# Patient Record
Sex: Female | Born: 1950 | Race: White | Hispanic: No | Smoking: Current some day smoker
Health system: Southern US, Community
[De-identification: ages and names within clinical notes are randomized; demographics above are authoritative.]

## PROBLEM LIST (undated history)

## (undated) DIAGNOSIS — E039 Hypothyroidism, unspecified: Secondary | ICD-10-CM

## (undated) DIAGNOSIS — I10 Essential (primary) hypertension: Secondary | ICD-10-CM

## (undated) DIAGNOSIS — J449 Chronic obstructive pulmonary disease, unspecified: Secondary | ICD-10-CM

## (undated) DIAGNOSIS — M1990 Unspecified osteoarthritis, unspecified site: Secondary | ICD-10-CM

## (undated) DIAGNOSIS — E119 Type 2 diabetes mellitus without complications: Secondary | ICD-10-CM

## (undated) DIAGNOSIS — I509 Heart failure, unspecified: Secondary | ICD-10-CM

## (undated) DIAGNOSIS — R7303 Prediabetes: Secondary | ICD-10-CM

## (undated) HISTORY — PX: ABDOMINAL HYSTERECTOMY: SHX81

## (undated) HISTORY — PX: APPENDECTOMY: SHX54

## (undated) HISTORY — PX: BACK SURGERY: SHX140

## (undated) HISTORY — PX: CHOLECYSTECTOMY: SHX55

---

## 1975-03-08 HISTORY — PX: TUBAL LIGATION: SHX77

## 1983-03-08 HISTORY — PX: ABDOMINAL HYSTERECTOMY: SHX81

## 1993-03-07 HISTORY — PX: CHOLECYSTECTOMY: SHX55

## 2004-03-07 HISTORY — PX: BACK SURGERY: SHX140

## 2006-02-17 ENCOUNTER — Ambulatory Visit (HOSPITAL_COMMUNITY): Admission: RE | Admit: 2006-02-17 | Discharge: 2006-02-17 | Payer: Self-pay | Admitting: Family Medicine

## 2006-03-22 ENCOUNTER — Inpatient Hospital Stay (HOSPITAL_COMMUNITY): Admission: RE | Admit: 2006-03-22 | Discharge: 2006-03-26 | Payer: Self-pay | Admitting: Neurosurgery

## 2007-08-09 ENCOUNTER — Ambulatory Visit (HOSPITAL_COMMUNITY): Admission: RE | Admit: 2007-08-09 | Discharge: 2007-08-09 | Payer: Self-pay | Admitting: Neurosurgery

## 2010-07-23 NOTE — Op Note (Signed)
Tara Tara [name], Tara [name]                ACCOUNT NO.:  [id number]   MEDICAL RECORD NO.:  [id number]          PATIENT TYPE:  INP   LOCATION:  3013                         FACILITY:  [facility]   PHYSICIAN:  Ophelia Charter, M.D.DATE OF BIRTH:  October 15, 1950   DATE OF PROCEDURE:  03/22/2006  DATE OF DISCHARGE:                               OPERATIVE REPORT   BRIEF HISTORY:  The patient is a 60 year old white female who suffers  from back and bilateral hip and leg pain.  She has failed medical  management and was worked up with a lumbar MRI which demonstrated the  patient had severe multifactorial spinal stenosis at L4-L5.  I discussed  the risks, treatment options with the patient including surgery.  The  patient has weighed the risks, benefits and alternatives of surgery and  decided to proceed with an L4-L5 decompression and fusion.   PREOPERATIVE DIAGNOSES:  1. L4-L5 spinal stenosis.  2. Degenerative disk disease.  3. Lumbar radiculopathy.  4. Lumbago.   POSTOPERATIVE DIAGNOSES:  1. L4-L5 spinal stenosis.  2. Degenerative disk disease.  3. Lumbar radiculopathy.  4. Lumbago.   PROCEDURE:  Extensive L4 laminectomy and bilateral foraminotomies; L4-L5  transforaminal lumbar interbody fusion; insertion of L4-L5 interbody  prosthesis and (capstone peak interbody prosthesis); L4-L5 posterior  nonsegmental instrumentation with legacy titanium pedicle screws and  rods; L4-L5 posterolateral arthrodesis with local morselized autograft  bone and VITOSS bone graft extender.   SURGEON:  Ophelia Charter, M.D.   ASSISTANT:  Ashok Pall, M.D.   ANESTHESIA:  General endotracheal anesthesia.   ESTIMATED BLOOD LOSS:  150 mL.   SPECIMENS:  None.   COMPLICATIONS:  None.   DESCRIPTION OF PROCEDURE:  The patient was brought to the operating room  by the anesthesia team.  General endotracheal anesthesia was induced.  The patient was then turned in a prone position on the Wilson frame.  Her  lumbosacral region was then prepared with Betadine scrub and  Betadine solution.  Sterile drapes were applied, and I injected the area  to be incised with Marcaine with epinephrine solution.  Used a scalpel  to make a linear midline incision over the L4-L5 interspace.  I used  electrocautery to perform a bilateral subperiosteal dissection exposing  the spinous process of the lamina of L3, L4, L5, and upper sacrum.  I  obtained intraoperative radiograph to confirm our location.  We then  inserted the Versa-Trac retractor for exposure.   We began by using high-speed drill to perform bilateral L4 laminotomies.  There was severe spinal stenosis secondary to overgrowth of the facets  and ligamentum flavum and bulging disk.  This required a greater  decompression that would be required for a standard PLIF, i.e., we  performed a wide bilateral laminotomy medial facetectomies and bilateral  foraminotomies about the L4-L5 nerve roots to decompress the neural  structures.   Having completed the decompression, extensive decompression on the  alternative with attention to the arthrodesis, we removed the right L4-  L5 facet.  This gave [place] wide exposure to the intravertebral disk.  I  incised the intervertebral  disk and performed a partial intervertebral  diskectomy using pituitary forceps. In preparation for the posterior  lumbar interbody fusion, we cleared the soft tissue from the vertebral  end plates at 075-GRM using the curets.  I then used trial spacers and  determined to use a 14 x 26 mm capsular PEEK interbody prosthesis.  We  prefilled this prosthesis with local autograft bone and VITOSS bone  graft extender.  We then inserted this prosthesis into the L4-L5  interspace, of course, after retracting the neural structures out of  harm's way with the nerve  retractors.  We then turned the prosthesis  sideways using the tamp.  There was a good snug fit for the prosthesis  in the interspace.  We  then filled the posterior prosthesis with local  autograft bone and VITOSS, completing the transforaminal and lumbar  interbody fusion.   We now turned our attention to the instrumentation.  Under fluoroscopic  guidance, we cannulated the bilateral L4 and L5 pedicles with the bone  probe.  We tapped the pedicles with a 4.5 mm tap.  We then probed this  out of the tapped pedicles without cortical breaches.  We then inserted  5.5 x 45 mm pedicle screws bilaterally at L4-L5 under fluoroscopic  guidance.  We then palpated along the medial aspect of bilateral L4-L5  pedicles and noted there was no cortical breaches and the L4 and L5  nerve roots were not injured.  We then connected unilateral pedicles  screws  lordotic rod which we fastened in place with the capsules.  We  tightened appropriately completing the instrumentation.   We now turned our attention to the posterolateral arthrodesis.  We  decorticated the remainder of the right L4-L5 facet by using high-speed  drill and the rongeurs.  We then laid a combination of local autograft  bone and VITOSS bone graft in the center of the decorticated  posterolateral structures completing the posterolateral arthrodesis.   We then inspected the thecal sac and the bilateral L4 and L5 nerve roots  and noted that they were well decompressed.  We obtain hemostasis with  bipolar cautery.  We removed the Versa-Trac retractor and then  reapproximated the patient's lumbar fascia with #1 Vicryl suture, the  subcutaneous tissue with interrupted 2-0 Vicryl suture, and the skin  with Steri-Strips and benzoin.  The wound was coated with Bacticin  ointment, a sterile dressing was applied.  The drapes were removed, the  patient was subsequently returned to the supine position where she was  extubated by anesthesia team and transported to the post anesthesia care  unit in stable condition.  All sponge, instrument and needle counts were correct at the end of  the case.      Ophelia Charter, M.D.  Electronically Signed     JDJ/MEDQ  D:  03/22/2006  T:  03/23/2006  Job:  [id number]

## 2010-07-23 NOTE — Discharge Summary (Signed)
Tara [name], SCHWANZ                ACCOUNT NO.:  [id number]   MEDICAL RECORD NO.:  [id number]          PATIENT TYPE:  INP   LOCATION:  3013                         FACILITY:  [facility]   PHYSICIAN:  Leeroy Cha, M.D.   DATE OF BIRTH:  [birth year]   DATE OF ADMISSION:  03/22/2006  DATE OF DISCHARGE:  03/26/2006                               DISCHARGE SUMMARY   ADMISSION DIAGNOSIS:  L4-5 degenerative disk disease with spinal  stenosis.   FINAL DIAGNOSIS:  L4-5 degenerative disk disease with spinal stenosis.   CLINICAL HISTORY:  The patient was admitted because of back pain  __________  of both legs.  The patient failed conservative treatment.  X-  rays showed degenerative disk disease at L4-5.  Laboratories normal.   [place]:  The patient was admitted by Dr. Newman Pies  and L4-5 fusion was done.  Today she is doing much better.  She is  ambulating.  The wound looks fine.  She feels that she wants to go home.   MEDICATIONS:  Percocet and Diazepam.   DIET:  Regular.   ACTIVITIES:  Not to drive.  Not to __________  elevated.   FOLLOW UP:  She is going to call to set up an appointment to see Dr.  Arnoldo Morale in the next 2-3 weeks.           ______________________________  Leeroy Cha, M.D.     EB/MEDQ  D:  03/26/2006  T:  03/26/2006  Job:  [id number]

## 2010-12-02 LAB — CREATININE, SERUM
GFR calc Af Amer: >60
GFR calc non Af Amer: 50 — ABNORMAL LOW

## 2011-04-19 DIAGNOSIS — J441 Chronic obstructive pulmonary disease with (acute) exacerbation: Secondary | ICD-10-CM | POA: Diagnosis not present

## 2011-04-19 DIAGNOSIS — R0602 Shortness of breath: Secondary | ICD-10-CM | POA: Diagnosis not present

## 2011-04-19 DIAGNOSIS — R1110 Vomiting, unspecified: Secondary | ICD-10-CM | POA: Diagnosis not present

## 2011-04-19 DIAGNOSIS — R0902 Hypoxemia: Secondary | ICD-10-CM | POA: Diagnosis not present

## 2011-04-19 DIAGNOSIS — R0989 Other specified symptoms and signs involving the circulatory and respiratory systems: Secondary | ICD-10-CM | POA: Diagnosis not present

## 2011-04-20 DIAGNOSIS — Z801 Family history of malignant neoplasm of trachea, bronchus and lung: Secondary | ICD-10-CM | POA: Diagnosis not present

## 2011-04-20 DIAGNOSIS — E871 Hypo-osmolality and hyponatremia: Secondary | ICD-10-CM | POA: Diagnosis not present

## 2011-04-20 DIAGNOSIS — R0989 Other specified symptoms and signs involving the circulatory and respiratory systems: Secondary | ICD-10-CM | POA: Diagnosis not present

## 2011-04-20 DIAGNOSIS — Z7982 Long term (current) use of aspirin: Secondary | ICD-10-CM | POA: Diagnosis not present

## 2011-04-20 DIAGNOSIS — J9600 Acute respiratory failure, unspecified whether with hypoxia or hypercapnia: Secondary | ICD-10-CM | POA: Diagnosis not present

## 2011-04-20 DIAGNOSIS — R0602 Shortness of breath: Secondary | ICD-10-CM | POA: Diagnosis not present

## 2011-04-20 DIAGNOSIS — E785 Hyperlipidemia, unspecified: Secondary | ICD-10-CM | POA: Diagnosis not present

## 2011-04-20 DIAGNOSIS — R0902 Hypoxemia: Secondary | ICD-10-CM | POA: Diagnosis not present

## 2011-04-20 DIAGNOSIS — J189 Pneumonia, unspecified organism: Secondary | ICD-10-CM | POA: Diagnosis not present

## 2011-04-20 DIAGNOSIS — R1110 Vomiting, unspecified: Secondary | ICD-10-CM | POA: Diagnosis not present

## 2011-04-20 DIAGNOSIS — F17200 Nicotine dependence, unspecified, uncomplicated: Secondary | ICD-10-CM | POA: Diagnosis present

## 2011-04-20 DIAGNOSIS — I10 Essential (primary) hypertension: Secondary | ICD-10-CM | POA: Diagnosis not present

## 2011-04-20 DIAGNOSIS — E876 Hypokalemia: Secondary | ICD-10-CM | POA: Diagnosis not present

## 2011-04-20 DIAGNOSIS — Z886 Allergy status to analgesic agent status: Secondary | ICD-10-CM | POA: Diagnosis not present

## 2011-04-20 DIAGNOSIS — Z79899 Other long term (current) drug therapy: Secondary | ICD-10-CM | POA: Diagnosis not present

## 2011-04-20 DIAGNOSIS — J441 Chronic obstructive pulmonary disease with (acute) exacerbation: Secondary | ICD-10-CM | POA: Diagnosis not present

## 2011-04-20 DIAGNOSIS — Z780 Asymptomatic menopausal state: Secondary | ICD-10-CM | POA: Diagnosis not present

## 2011-04-20 DIAGNOSIS — M1990 Unspecified osteoarthritis, unspecified site: Secondary | ICD-10-CM | POA: Diagnosis present

## 2011-04-20 DIAGNOSIS — J449 Chronic obstructive pulmonary disease, unspecified: Secondary | ICD-10-CM | POA: Diagnosis not present

## 2011-04-20 DIAGNOSIS — I509 Heart failure, unspecified: Secondary | ICD-10-CM | POA: Diagnosis not present

## 2011-04-20 DIAGNOSIS — E039 Hypothyroidism, unspecified: Secondary | ICD-10-CM | POA: Diagnosis present

## 2011-04-20 DIAGNOSIS — E878 Other disorders of electrolyte and fluid balance, not elsewhere classified: Secondary | ICD-10-CM | POA: Diagnosis not present

## 2011-05-08 DIAGNOSIS — J449 Chronic obstructive pulmonary disease, unspecified: Secondary | ICD-10-CM | POA: Diagnosis not present

## 2011-05-08 DIAGNOSIS — R918 Other nonspecific abnormal finding of lung field: Secondary | ICD-10-CM | POA: Diagnosis not present

## 2011-05-08 DIAGNOSIS — I2789 Other specified pulmonary heart diseases: Secondary | ICD-10-CM | POA: Diagnosis not present

## 2011-05-09 DIAGNOSIS — F17200 Nicotine dependence, unspecified, uncomplicated: Secondary | ICD-10-CM | POA: Diagnosis present

## 2011-05-09 DIAGNOSIS — Z7982 Long term (current) use of aspirin: Secondary | ICD-10-CM | POA: Diagnosis not present

## 2011-05-09 DIAGNOSIS — J4489 Other specified chronic obstructive pulmonary disease: Secondary | ICD-10-CM | POA: Diagnosis not present

## 2011-05-09 DIAGNOSIS — E039 Hypothyroidism, unspecified: Secondary | ICD-10-CM | POA: Diagnosis not present

## 2011-05-09 DIAGNOSIS — Z780 Asymptomatic menopausal state: Secondary | ICD-10-CM | POA: Diagnosis not present

## 2011-05-09 DIAGNOSIS — IMO0002 Reserved for concepts with insufficient information to code with codable children: Secondary | ICD-10-CM | POA: Diagnosis not present

## 2011-05-09 DIAGNOSIS — I10 Essential (primary) hypertension: Secondary | ICD-10-CM | POA: Diagnosis not present

## 2011-05-09 DIAGNOSIS — Z79899 Other long term (current) drug therapy: Secondary | ICD-10-CM | POA: Diagnosis not present

## 2011-05-09 DIAGNOSIS — E7800 Pure hypercholesterolemia, unspecified: Secondary | ICD-10-CM | POA: Diagnosis present

## 2011-05-09 DIAGNOSIS — E785 Hyperlipidemia, unspecified: Secondary | ICD-10-CM | POA: Diagnosis present

## 2011-05-09 DIAGNOSIS — E236 Other disorders of pituitary gland: Secondary | ICD-10-CM | POA: Diagnosis not present

## 2011-05-09 DIAGNOSIS — E871 Hypo-osmolality and hyponatremia: Secondary | ICD-10-CM | POA: Diagnosis not present

## 2011-05-09 DIAGNOSIS — J449 Chronic obstructive pulmonary disease, unspecified: Secondary | ICD-10-CM | POA: Diagnosis not present

## 2011-05-09 DIAGNOSIS — M47817 Spondylosis without myelopathy or radiculopathy, lumbosacral region: Secondary | ICD-10-CM | POA: Diagnosis present

## 2011-05-09 DIAGNOSIS — D539 Nutritional anemia, unspecified: Secondary | ICD-10-CM | POA: Diagnosis present

## 2011-05-10 DIAGNOSIS — E039 Hypothyroidism, unspecified: Secondary | ICD-10-CM | POA: Diagnosis not present

## 2011-05-10 DIAGNOSIS — E871 Hypo-osmolality and hyponatremia: Secondary | ICD-10-CM | POA: Diagnosis not present

## 2011-05-10 DIAGNOSIS — J449 Chronic obstructive pulmonary disease, unspecified: Secondary | ICD-10-CM | POA: Diagnosis not present

## 2011-05-10 DIAGNOSIS — E236 Other disorders of pituitary gland: Secondary | ICD-10-CM | POA: Diagnosis not present

## 2011-05-10 DIAGNOSIS — I10 Essential (primary) hypertension: Secondary | ICD-10-CM | POA: Diagnosis not present

## 2011-05-11 DIAGNOSIS — I10 Essential (primary) hypertension: Secondary | ICD-10-CM | POA: Diagnosis not present

## 2011-05-11 DIAGNOSIS — E871 Hypo-osmolality and hyponatremia: Secondary | ICD-10-CM | POA: Diagnosis not present

## 2011-05-11 DIAGNOSIS — E039 Hypothyroidism, unspecified: Secondary | ICD-10-CM | POA: Diagnosis not present

## 2011-05-11 DIAGNOSIS — J449 Chronic obstructive pulmonary disease, unspecified: Secondary | ICD-10-CM | POA: Diagnosis not present

## 2011-05-11 DIAGNOSIS — E236 Other disorders of pituitary gland: Secondary | ICD-10-CM | POA: Diagnosis not present

## 2011-05-17 DIAGNOSIS — K250 Acute gastric ulcer with hemorrhage: Secondary | ICD-10-CM | POA: Diagnosis not present

## 2011-05-17 DIAGNOSIS — J449 Chronic obstructive pulmonary disease, unspecified: Secondary | ICD-10-CM | POA: Diagnosis not present

## 2011-05-17 DIAGNOSIS — E871 Hypo-osmolality and hyponatremia: Secondary | ICD-10-CM | POA: Diagnosis not present

## 2011-05-17 DIAGNOSIS — E782 Mixed hyperlipidemia: Secondary | ICD-10-CM | POA: Diagnosis not present

## 2011-05-17 DIAGNOSIS — E038 Other specified hypothyroidism: Secondary | ICD-10-CM | POA: Diagnosis not present

## 2011-05-17 DIAGNOSIS — Z79899 Other long term (current) drug therapy: Secondary | ICD-10-CM | POA: Diagnosis not present

## 2011-07-18 DIAGNOSIS — J449 Chronic obstructive pulmonary disease, unspecified: Secondary | ICD-10-CM | POA: Diagnosis not present

## 2011-07-18 DIAGNOSIS — M549 Dorsalgia, unspecified: Secondary | ICD-10-CM | POA: Diagnosis not present

## 2012-07-20 DIAGNOSIS — R0789 Other chest pain: Secondary | ICD-10-CM | POA: Diagnosis not present

## 2012-07-23 DIAGNOSIS — R059 Cough, unspecified: Secondary | ICD-10-CM | POA: Diagnosis not present

## 2012-07-23 DIAGNOSIS — Z79899 Other long term (current) drug therapy: Secondary | ICD-10-CM | POA: Diagnosis not present

## 2012-07-23 DIAGNOSIS — J438 Other emphysema: Secondary | ICD-10-CM | POA: Diagnosis not present

## 2012-07-23 DIAGNOSIS — R079 Chest pain, unspecified: Secondary | ICD-10-CM | POA: Diagnosis not present

## 2012-07-25 DIAGNOSIS — I10 Essential (primary) hypertension: Secondary | ICD-10-CM | POA: Diagnosis not present

## 2012-07-25 DIAGNOSIS — F17200 Nicotine dependence, unspecified, uncomplicated: Secondary | ICD-10-CM | POA: Diagnosis not present

## 2012-07-25 DIAGNOSIS — R0789 Other chest pain: Secondary | ICD-10-CM | POA: Diagnosis not present

## 2012-07-25 DIAGNOSIS — R799 Abnormal finding of blood chemistry, unspecified: Secondary | ICD-10-CM | POA: Diagnosis not present

## 2012-07-25 DIAGNOSIS — J449 Chronic obstructive pulmonary disease, unspecified: Secondary | ICD-10-CM | POA: Diagnosis not present

## 2012-10-29 DIAGNOSIS — I10 Essential (primary) hypertension: Secondary | ICD-10-CM | POA: Diagnosis not present

## 2013-01-29 DIAGNOSIS — Z6837 Body mass index (BMI) 37.0-37.9, adult: Secondary | ICD-10-CM | POA: Diagnosis not present

## 2013-01-29 DIAGNOSIS — I10 Essential (primary) hypertension: Secondary | ICD-10-CM | POA: Diagnosis not present

## 2013-08-06 DIAGNOSIS — I10 Essential (primary) hypertension: Secondary | ICD-10-CM | POA: Diagnosis not present

## 2013-08-06 DIAGNOSIS — Z6837 Body mass index (BMI) 37.0-37.9, adult: Secondary | ICD-10-CM | POA: Diagnosis not present

## 2013-11-08 DIAGNOSIS — I10 Essential (primary) hypertension: Secondary | ICD-10-CM | POA: Diagnosis not present

## 2013-11-08 DIAGNOSIS — Z6838 Body mass index (BMI) 38.0-38.9, adult: Secondary | ICD-10-CM | POA: Diagnosis not present

## 2014-02-11 DIAGNOSIS — J441 Chronic obstructive pulmonary disease with (acute) exacerbation: Secondary | ICD-10-CM | POA: Diagnosis not present

## 2014-02-11 DIAGNOSIS — Z6838 Body mass index (BMI) 38.0-38.9, adult: Secondary | ICD-10-CM | POA: Diagnosis not present

## 2014-02-11 DIAGNOSIS — I10 Essential (primary) hypertension: Secondary | ICD-10-CM | POA: Diagnosis not present

## 2014-02-11 DIAGNOSIS — E039 Hypothyroidism, unspecified: Secondary | ICD-10-CM | POA: Diagnosis not present

## 2014-03-11 DIAGNOSIS — E039 Hypothyroidism, unspecified: Secondary | ICD-10-CM | POA: Diagnosis not present

## 2014-03-11 DIAGNOSIS — Z8249 Family history of ischemic heart disease and other diseases of the circulatory system: Secondary | ICD-10-CM | POA: Diagnosis not present

## 2014-03-11 DIAGNOSIS — F17210 Nicotine dependence, cigarettes, uncomplicated: Secondary | ICD-10-CM | POA: Diagnosis not present

## 2014-03-11 DIAGNOSIS — Z885 Allergy status to narcotic agent status: Secondary | ICD-10-CM | POA: Diagnosis not present

## 2014-03-11 DIAGNOSIS — Z79899 Other long term (current) drug therapy: Secondary | ICD-10-CM | POA: Diagnosis not present

## 2014-03-11 DIAGNOSIS — Z7982 Long term (current) use of aspirin: Secondary | ICD-10-CM | POA: Diagnosis not present

## 2014-03-11 DIAGNOSIS — Z1211 Encounter for screening for malignant neoplasm of colon: Secondary | ICD-10-CM | POA: Diagnosis not present

## 2014-03-11 DIAGNOSIS — K648 Other hemorrhoids: Secondary | ICD-10-CM | POA: Diagnosis not present

## 2014-03-11 DIAGNOSIS — Z801 Family history of malignant neoplasm of trachea, bronchus and lung: Secondary | ICD-10-CM | POA: Diagnosis not present

## 2014-03-11 DIAGNOSIS — E669 Obesity, unspecified: Secondary | ICD-10-CM | POA: Diagnosis not present

## 2014-03-11 DIAGNOSIS — J449 Chronic obstructive pulmonary disease, unspecified: Secondary | ICD-10-CM | POA: Diagnosis not present

## 2014-03-11 DIAGNOSIS — Z7951 Long term (current) use of inhaled steroids: Secondary | ICD-10-CM | POA: Diagnosis not present

## 2014-03-11 DIAGNOSIS — K5730 Diverticulosis of large intestine without perforation or abscess without bleeding: Secondary | ICD-10-CM | POA: Diagnosis not present

## 2014-03-11 DIAGNOSIS — Z800 Family history of malignant neoplasm of digestive organs: Secondary | ICD-10-CM | POA: Diagnosis not present

## 2014-03-11 DIAGNOSIS — E785 Hyperlipidemia, unspecified: Secondary | ICD-10-CM | POA: Diagnosis not present

## 2014-03-11 DIAGNOSIS — Z6839 Body mass index (BMI) 39.0-39.9, adult: Secondary | ICD-10-CM | POA: Diagnosis not present

## 2014-03-11 DIAGNOSIS — I10 Essential (primary) hypertension: Secondary | ICD-10-CM | POA: Diagnosis not present

## 2014-05-16 DIAGNOSIS — Z6839 Body mass index (BMI) 39.0-39.9, adult: Secondary | ICD-10-CM | POA: Diagnosis not present

## 2014-05-16 DIAGNOSIS — E039 Hypothyroidism, unspecified: Secondary | ICD-10-CM | POA: Diagnosis not present

## 2014-05-16 DIAGNOSIS — L242 Irritant contact dermatitis due to solvents: Secondary | ICD-10-CM | POA: Diagnosis not present

## 2014-05-16 DIAGNOSIS — I10 Essential (primary) hypertension: Secondary | ICD-10-CM | POA: Diagnosis not present

## 2014-05-16 DIAGNOSIS — J449 Chronic obstructive pulmonary disease, unspecified: Secondary | ICD-10-CM | POA: Diagnosis not present

## 2014-08-21 DIAGNOSIS — Z6838 Body mass index (BMI) 38.0-38.9, adult: Secondary | ICD-10-CM | POA: Diagnosis not present

## 2014-08-21 DIAGNOSIS — I10 Essential (primary) hypertension: Secondary | ICD-10-CM | POA: Diagnosis not present

## 2014-08-21 DIAGNOSIS — J449 Chronic obstructive pulmonary disease, unspecified: Secondary | ICD-10-CM | POA: Diagnosis not present

## 2014-08-21 DIAGNOSIS — E039 Hypothyroidism, unspecified: Secondary | ICD-10-CM | POA: Diagnosis not present

## 2014-11-25 DIAGNOSIS — J449 Chronic obstructive pulmonary disease, unspecified: Secondary | ICD-10-CM | POA: Diagnosis not present

## 2014-11-25 DIAGNOSIS — I10 Essential (primary) hypertension: Secondary | ICD-10-CM | POA: Diagnosis not present

## 2014-11-25 DIAGNOSIS — E039 Hypothyroidism, unspecified: Secondary | ICD-10-CM | POA: Diagnosis not present

## 2014-11-25 DIAGNOSIS — Z6837 Body mass index (BMI) 37.0-37.9, adult: Secondary | ICD-10-CM | POA: Diagnosis not present

## 2015-02-25 DIAGNOSIS — F330 Major depressive disorder, recurrent, mild: Secondary | ICD-10-CM | POA: Diagnosis not present

## 2015-02-25 DIAGNOSIS — E038 Other specified hypothyroidism: Secondary | ICD-10-CM | POA: Diagnosis not present

## 2015-02-25 DIAGNOSIS — Z6838 Body mass index (BMI) 38.0-38.9, adult: Secondary | ICD-10-CM | POA: Diagnosis not present

## 2015-02-25 DIAGNOSIS — J440 Chronic obstructive pulmonary disease with acute lower respiratory infection: Secondary | ICD-10-CM | POA: Diagnosis not present

## 2015-02-25 DIAGNOSIS — I10 Essential (primary) hypertension: Secondary | ICD-10-CM | POA: Diagnosis not present

## 2015-05-12 DIAGNOSIS — M545 Low back pain: Secondary | ICD-10-CM | POA: Diagnosis not present

## 2015-05-12 DIAGNOSIS — Z6839 Body mass index (BMI) 39.0-39.9, adult: Secondary | ICD-10-CM | POA: Diagnosis not present

## 2015-05-12 DIAGNOSIS — J440 Chronic obstructive pulmonary disease with acute lower respiratory infection: Secondary | ICD-10-CM | POA: Diagnosis not present

## 2015-05-12 DIAGNOSIS — I10 Essential (primary) hypertension: Secondary | ICD-10-CM | POA: Diagnosis not present

## 2015-05-12 DIAGNOSIS — E038 Other specified hypothyroidism: Secondary | ICD-10-CM | POA: Diagnosis not present

## 2015-06-09 DIAGNOSIS — Z6841 Body Mass Index (BMI) 40.0 and over, adult: Secondary | ICD-10-CM | POA: Diagnosis not present

## 2015-06-09 DIAGNOSIS — M545 Low back pain: Secondary | ICD-10-CM | POA: Diagnosis not present

## 2015-06-09 DIAGNOSIS — M5416 Radiculopathy, lumbar region: Secondary | ICD-10-CM | POA: Diagnosis not present

## 2015-06-12 DIAGNOSIS — M5416 Radiculopathy, lumbar region: Secondary | ICD-10-CM | POA: Diagnosis not present

## 2015-08-13 DIAGNOSIS — Z131 Encounter for screening for diabetes mellitus: Secondary | ICD-10-CM | POA: Diagnosis not present

## 2015-08-13 DIAGNOSIS — Z0000 Encounter for general adult medical examination without abnormal findings: Secondary | ICD-10-CM | POA: Diagnosis not present

## 2015-08-13 DIAGNOSIS — I10 Essential (primary) hypertension: Secondary | ICD-10-CM | POA: Diagnosis not present

## 2015-08-13 DIAGNOSIS — J440 Chronic obstructive pulmonary disease with acute lower respiratory infection: Secondary | ICD-10-CM | POA: Diagnosis not present

## 2015-08-13 DIAGNOSIS — N182 Chronic kidney disease, stage 2 (mild): Secondary | ICD-10-CM | POA: Diagnosis not present

## 2015-08-28 DIAGNOSIS — I10 Essential (primary) hypertension: Secondary | ICD-10-CM | POA: Diagnosis not present

## 2015-09-14 DIAGNOSIS — Z1231 Encounter for screening mammogram for malignant neoplasm of breast: Secondary | ICD-10-CM | POA: Diagnosis not present

## 2015-09-14 DIAGNOSIS — Z23 Encounter for immunization: Secondary | ICD-10-CM | POA: Diagnosis not present

## 2015-11-11 DIAGNOSIS — Z135 Encounter for screening for eye and ear disorders: Secondary | ICD-10-CM | POA: Diagnosis not present

## 2015-11-28 DIAGNOSIS — Z23 Encounter for immunization: Secondary | ICD-10-CM | POA: Diagnosis not present

## 2015-12-01 DIAGNOSIS — J440 Chronic obstructive pulmonary disease with acute lower respiratory infection: Secondary | ICD-10-CM | POA: Diagnosis not present

## 2015-12-01 DIAGNOSIS — I7389 Other specified peripheral vascular diseases: Secondary | ICD-10-CM | POA: Diagnosis not present

## 2015-12-01 DIAGNOSIS — N182 Chronic kidney disease, stage 2 (mild): Secondary | ICD-10-CM | POA: Diagnosis not present

## 2015-12-01 DIAGNOSIS — I10 Essential (primary) hypertension: Secondary | ICD-10-CM | POA: Diagnosis not present

## 2015-12-21 DIAGNOSIS — H40003 Preglaucoma, unspecified, bilateral: Secondary | ICD-10-CM | POA: Diagnosis not present

## 2015-12-28 DIAGNOSIS — H40003 Preglaucoma, unspecified, bilateral: Secondary | ICD-10-CM | POA: Diagnosis not present

## 2016-01-11 DIAGNOSIS — Z6841 Body Mass Index (BMI) 40.0 and over, adult: Secondary | ICD-10-CM | POA: Diagnosis not present

## 2016-01-11 DIAGNOSIS — Z1272 Encounter for screening for malignant neoplasm of vagina: Secondary | ICD-10-CM | POA: Diagnosis not present

## 2016-01-11 DIAGNOSIS — Z01419 Encounter for gynecological examination (general) (routine) without abnormal findings: Secondary | ICD-10-CM | POA: Diagnosis not present

## 2016-03-14 DIAGNOSIS — E038 Other specified hypothyroidism: Secondary | ICD-10-CM | POA: Diagnosis not present

## 2016-03-14 DIAGNOSIS — J440 Chronic obstructive pulmonary disease with acute lower respiratory infection: Secondary | ICD-10-CM | POA: Diagnosis not present

## 2016-03-14 DIAGNOSIS — I10 Essential (primary) hypertension: Secondary | ICD-10-CM | POA: Diagnosis not present

## 2016-03-14 DIAGNOSIS — J449 Chronic obstructive pulmonary disease, unspecified: Secondary | ICD-10-CM | POA: Diagnosis not present

## 2016-03-14 DIAGNOSIS — Z131 Encounter for screening for diabetes mellitus: Secondary | ICD-10-CM | POA: Diagnosis not present

## 2016-03-14 DIAGNOSIS — N182 Chronic kidney disease, stage 2 (mild): Secondary | ICD-10-CM | POA: Diagnosis not present

## 2016-04-04 DIAGNOSIS — Z7951 Long term (current) use of inhaled steroids: Secondary | ICD-10-CM | POA: Diagnosis not present

## 2016-04-04 DIAGNOSIS — J441 Chronic obstructive pulmonary disease with (acute) exacerbation: Secondary | ICD-10-CM | POA: Diagnosis not present

## 2016-04-04 DIAGNOSIS — Z780 Asymptomatic menopausal state: Secondary | ICD-10-CM | POA: Diagnosis not present

## 2016-04-04 DIAGNOSIS — E785 Hyperlipidemia, unspecified: Secondary | ICD-10-CM | POA: Diagnosis not present

## 2016-04-04 DIAGNOSIS — E7800 Pure hypercholesterolemia, unspecified: Secondary | ICD-10-CM | POA: Diagnosis not present

## 2016-04-04 DIAGNOSIS — E038 Other specified hypothyroidism: Secondary | ICD-10-CM | POA: Diagnosis not present

## 2016-04-04 DIAGNOSIS — Z79899 Other long term (current) drug therapy: Secondary | ICD-10-CM | POA: Diagnosis not present

## 2016-04-04 DIAGNOSIS — Z7982 Long term (current) use of aspirin: Secondary | ICD-10-CM | POA: Diagnosis not present

## 2016-04-04 DIAGNOSIS — Z820 Family history of epilepsy and other diseases of the nervous system: Secondary | ICD-10-CM | POA: Diagnosis not present

## 2016-04-04 DIAGNOSIS — I509 Heart failure, unspecified: Secondary | ICD-10-CM | POA: Diagnosis not present

## 2016-04-04 DIAGNOSIS — Z801 Family history of malignant neoplasm of trachea, bronchus and lung: Secondary | ICD-10-CM | POA: Diagnosis not present

## 2016-04-04 DIAGNOSIS — J9602 Acute respiratory failure with hypercapnia: Secondary | ICD-10-CM | POA: Diagnosis not present

## 2016-04-04 DIAGNOSIS — Z886 Allergy status to analgesic agent status: Secondary | ICD-10-CM | POA: Diagnosis not present

## 2016-04-04 DIAGNOSIS — E039 Hypothyroidism, unspecified: Secondary | ICD-10-CM | POA: Diagnosis not present

## 2016-04-04 DIAGNOSIS — I5031 Acute diastolic (congestive) heart failure: Secondary | ICD-10-CM | POA: Diagnosis not present

## 2016-04-04 DIAGNOSIS — F17200 Nicotine dependence, unspecified, uncomplicated: Secondary | ICD-10-CM | POA: Diagnosis not present

## 2016-04-04 DIAGNOSIS — M47896 Other spondylosis, lumbar region: Secondary | ICD-10-CM | POA: Diagnosis not present

## 2016-04-04 DIAGNOSIS — I110 Hypertensive heart disease with heart failure: Secondary | ICD-10-CM | POA: Diagnosis not present

## 2016-04-04 DIAGNOSIS — R0602 Shortness of breath: Secondary | ICD-10-CM | POA: Diagnosis not present

## 2016-04-04 DIAGNOSIS — Z8249 Family history of ischemic heart disease and other diseases of the circulatory system: Secondary | ICD-10-CM | POA: Diagnosis not present

## 2016-04-04 DIAGNOSIS — J449 Chronic obstructive pulmonary disease, unspecified: Secondary | ICD-10-CM | POA: Diagnosis not present

## 2016-04-07 DIAGNOSIS — E038 Other specified hypothyroidism: Secondary | ICD-10-CM | POA: Diagnosis not present

## 2016-04-07 DIAGNOSIS — J9602 Acute respiratory failure with hypercapnia: Secondary | ICD-10-CM | POA: Diagnosis not present

## 2016-04-07 DIAGNOSIS — I5031 Acute diastolic (congestive) heart failure: Secondary | ICD-10-CM | POA: Diagnosis not present

## 2016-04-07 DIAGNOSIS — I110 Hypertensive heart disease with heart failure: Secondary | ICD-10-CM | POA: Diagnosis not present

## 2016-04-07 DIAGNOSIS — J441 Chronic obstructive pulmonary disease with (acute) exacerbation: Secondary | ICD-10-CM | POA: Diagnosis not present

## 2016-04-14 DIAGNOSIS — J449 Chronic obstructive pulmonary disease, unspecified: Secondary | ICD-10-CM | POA: Diagnosis not present

## 2016-04-15 DIAGNOSIS — N182 Chronic kidney disease, stage 2 (mild): Secondary | ICD-10-CM | POA: Diagnosis not present

## 2016-04-15 DIAGNOSIS — J440 Chronic obstructive pulmonary disease with acute lower respiratory infection: Secondary | ICD-10-CM | POA: Diagnosis not present

## 2016-04-15 DIAGNOSIS — I10 Essential (primary) hypertension: Secondary | ICD-10-CM | POA: Diagnosis not present

## 2016-04-15 DIAGNOSIS — Z6841 Body Mass Index (BMI) 40.0 and over, adult: Secondary | ICD-10-CM | POA: Diagnosis not present

## 2016-04-28 DIAGNOSIS — I872 Venous insufficiency (chronic) (peripheral): Secondary | ICD-10-CM | POA: Diagnosis not present

## 2016-04-28 DIAGNOSIS — Z6841 Body Mass Index (BMI) 40.0 and over, adult: Secondary | ICD-10-CM | POA: Diagnosis not present

## 2016-05-04 DIAGNOSIS — I83018 Varicose veins of right lower extremity with ulcer other part of lower leg: Secondary | ICD-10-CM | POA: Diagnosis not present

## 2016-05-04 DIAGNOSIS — Z79899 Other long term (current) drug therapy: Secondary | ICD-10-CM | POA: Diagnosis not present

## 2016-05-04 DIAGNOSIS — E039 Hypothyroidism, unspecified: Secondary | ICD-10-CM | POA: Diagnosis not present

## 2016-05-04 DIAGNOSIS — Z9981 Dependence on supplemental oxygen: Secondary | ICD-10-CM | POA: Diagnosis not present

## 2016-05-04 DIAGNOSIS — Z885 Allergy status to narcotic agent status: Secondary | ICD-10-CM | POA: Diagnosis not present

## 2016-05-04 DIAGNOSIS — F17210 Nicotine dependence, cigarettes, uncomplicated: Secondary | ICD-10-CM | POA: Diagnosis not present

## 2016-05-04 DIAGNOSIS — I110 Hypertensive heart disease with heart failure: Secondary | ICD-10-CM | POA: Diagnosis not present

## 2016-05-04 DIAGNOSIS — E7800 Pure hypercholesterolemia, unspecified: Secondary | ICD-10-CM | POA: Diagnosis not present

## 2016-05-04 DIAGNOSIS — L97821 Non-pressure chronic ulcer of other part of left lower leg limited to breakdown of skin: Secondary | ICD-10-CM | POA: Diagnosis not present

## 2016-05-04 DIAGNOSIS — I872 Venous insufficiency (chronic) (peripheral): Secondary | ICD-10-CM | POA: Diagnosis not present

## 2016-05-04 DIAGNOSIS — J449 Chronic obstructive pulmonary disease, unspecified: Secondary | ICD-10-CM | POA: Diagnosis not present

## 2016-05-04 DIAGNOSIS — Z7982 Long term (current) use of aspirin: Secondary | ICD-10-CM | POA: Diagnosis not present

## 2016-05-04 DIAGNOSIS — L97811 Non-pressure chronic ulcer of other part of right lower leg limited to breakdown of skin: Secondary | ICD-10-CM | POA: Diagnosis not present

## 2016-05-04 DIAGNOSIS — I509 Heart failure, unspecified: Secondary | ICD-10-CM | POA: Diagnosis not present

## 2016-05-12 DIAGNOSIS — J449 Chronic obstructive pulmonary disease, unspecified: Secondary | ICD-10-CM | POA: Diagnosis not present

## 2016-05-14 DIAGNOSIS — R0682 Tachypnea, not elsewhere classified: Secondary | ICD-10-CM | POA: Diagnosis not present

## 2016-05-14 DIAGNOSIS — J189 Pneumonia, unspecified organism: Secondary | ICD-10-CM | POA: Diagnosis not present

## 2016-05-14 DIAGNOSIS — R918 Other nonspecific abnormal finding of lung field: Secondary | ICD-10-CM | POA: Diagnosis not present

## 2016-05-14 DIAGNOSIS — I4891 Unspecified atrial fibrillation: Secondary | ICD-10-CM | POA: Diagnosis not present

## 2016-05-14 DIAGNOSIS — R062 Wheezing: Secondary | ICD-10-CM | POA: Diagnosis not present

## 2016-05-14 DIAGNOSIS — I480 Paroxysmal atrial fibrillation: Secondary | ICD-10-CM | POA: Diagnosis not present

## 2016-05-14 DIAGNOSIS — J441 Chronic obstructive pulmonary disease with (acute) exacerbation: Secondary | ICD-10-CM | POA: Diagnosis not present

## 2016-05-14 DIAGNOSIS — T68XXXA Hypothermia, initial encounter: Secondary | ICD-10-CM | POA: Diagnosis not present

## 2016-05-14 DIAGNOSIS — J9600 Acute respiratory failure, unspecified whether with hypoxia or hypercapnia: Secondary | ICD-10-CM | POA: Diagnosis not present

## 2016-05-14 DIAGNOSIS — R404 Transient alteration of awareness: Secondary | ICD-10-CM | POA: Diagnosis not present

## 2016-05-14 DIAGNOSIS — I482 Chronic atrial fibrillation: Secondary | ICD-10-CM | POA: Diagnosis not present

## 2016-05-14 DIAGNOSIS — I509 Heart failure, unspecified: Secondary | ICD-10-CM | POA: Diagnosis not present

## 2016-05-15 DIAGNOSIS — Z452 Encounter for adjustment and management of vascular access device: Secondary | ICD-10-CM | POA: Diagnosis not present

## 2016-05-15 DIAGNOSIS — L89152 Pressure ulcer of sacral region, stage 2: Secondary | ICD-10-CM | POA: Diagnosis not present

## 2016-05-15 DIAGNOSIS — E785 Hyperlipidemia, unspecified: Secondary | ICD-10-CM | POA: Diagnosis not present

## 2016-05-15 DIAGNOSIS — Z7951 Long term (current) use of inhaled steroids: Secondary | ICD-10-CM | POA: Diagnosis not present

## 2016-05-15 DIAGNOSIS — Z7982 Long term (current) use of aspirin: Secondary | ICD-10-CM | POA: Diagnosis not present

## 2016-05-15 DIAGNOSIS — Z4682 Encounter for fitting and adjustment of non-vascular catheter: Secondary | ICD-10-CM | POA: Diagnosis not present

## 2016-05-15 DIAGNOSIS — J9601 Acute respiratory failure with hypoxia: Secondary | ICD-10-CM | POA: Diagnosis not present

## 2016-05-15 DIAGNOSIS — Z79899 Other long term (current) drug therapy: Secondary | ICD-10-CM | POA: Diagnosis not present

## 2016-05-15 DIAGNOSIS — R0989 Other specified symptoms and signs involving the circulatory and respiratory systems: Secondary | ICD-10-CM | POA: Diagnosis not present

## 2016-05-15 DIAGNOSIS — I110 Hypertensive heart disease with heart failure: Secondary | ICD-10-CM | POA: Diagnosis not present

## 2016-05-15 DIAGNOSIS — I5041 Acute combined systolic (congestive) and diastolic (congestive) heart failure: Secondary | ICD-10-CM | POA: Diagnosis not present

## 2016-05-15 DIAGNOSIS — E039 Hypothyroidism, unspecified: Secondary | ICD-10-CM | POA: Diagnosis not present

## 2016-05-15 DIAGNOSIS — I509 Heart failure, unspecified: Secondary | ICD-10-CM | POA: Diagnosis not present

## 2016-05-15 DIAGNOSIS — I480 Paroxysmal atrial fibrillation: Secondary | ICD-10-CM | POA: Diagnosis not present

## 2016-05-15 DIAGNOSIS — J9602 Acute respiratory failure with hypercapnia: Secondary | ICD-10-CM | POA: Diagnosis not present

## 2016-05-15 DIAGNOSIS — E873 Alkalosis: Secondary | ICD-10-CM | POA: Diagnosis not present

## 2016-05-15 DIAGNOSIS — R918 Other nonspecific abnormal finding of lung field: Secondary | ICD-10-CM | POA: Diagnosis not present

## 2016-05-15 DIAGNOSIS — M47896 Other spondylosis, lumbar region: Secondary | ICD-10-CM | POA: Diagnosis not present

## 2016-05-15 DIAGNOSIS — X31XXXA Exposure to excessive natural cold, initial encounter: Secondary | ICD-10-CM | POA: Diagnosis not present

## 2016-05-15 DIAGNOSIS — M810 Age-related osteoporosis without current pathological fracture: Secondary | ICD-10-CM | POA: Diagnosis not present

## 2016-05-15 DIAGNOSIS — Z886 Allergy status to analgesic agent status: Secondary | ICD-10-CM | POA: Diagnosis not present

## 2016-05-15 DIAGNOSIS — Z780 Asymptomatic menopausal state: Secondary | ICD-10-CM | POA: Diagnosis not present

## 2016-05-15 DIAGNOSIS — J441 Chronic obstructive pulmonary disease with (acute) exacerbation: Secondary | ICD-10-CM | POA: Diagnosis not present

## 2016-05-15 DIAGNOSIS — E876 Hypokalemia: Secondary | ICD-10-CM | POA: Diagnosis not present

## 2016-05-15 DIAGNOSIS — T68XXXA Hypothermia, initial encounter: Secondary | ICD-10-CM | POA: Diagnosis not present

## 2016-05-15 DIAGNOSIS — J189 Pneumonia, unspecified organism: Secondary | ICD-10-CM | POA: Diagnosis not present

## 2016-05-15 DIAGNOSIS — J9600 Acute respiratory failure, unspecified whether with hypoxia or hypercapnia: Secondary | ICD-10-CM | POA: Diagnosis not present

## 2016-05-15 DIAGNOSIS — N289 Disorder of kidney and ureter, unspecified: Secondary | ICD-10-CM | POA: Diagnosis not present

## 2016-05-15 DIAGNOSIS — J9690 Respiratory failure, unspecified, unspecified whether with hypoxia or hypercapnia: Secondary | ICD-10-CM | POA: Diagnosis not present

## 2016-05-16 DIAGNOSIS — J9600 Acute respiratory failure, unspecified whether with hypoxia or hypercapnia: Secondary | ICD-10-CM | POA: Diagnosis not present

## 2016-05-16 DIAGNOSIS — R0989 Other specified symptoms and signs involving the circulatory and respiratory systems: Secondary | ICD-10-CM | POA: Diagnosis not present

## 2016-05-16 DIAGNOSIS — J9811 Atelectasis: Secondary | ICD-10-CM | POA: Diagnosis not present

## 2016-05-17 DIAGNOSIS — Z4682 Encounter for fitting and adjustment of non-vascular catheter: Secondary | ICD-10-CM | POA: Diagnosis not present

## 2016-05-25 DIAGNOSIS — J90 Pleural effusion, not elsewhere classified: Secondary | ICD-10-CM | POA: Diagnosis not present

## 2016-05-25 DIAGNOSIS — R0989 Other specified symptoms and signs involving the circulatory and respiratory systems: Secondary | ICD-10-CM | POA: Diagnosis not present

## 2016-05-28 DIAGNOSIS — J9601 Acute respiratory failure with hypoxia: Secondary | ICD-10-CM | POA: Diagnosis not present

## 2016-05-28 DIAGNOSIS — J9602 Acute respiratory failure with hypercapnia: Secondary | ICD-10-CM | POA: Diagnosis not present

## 2016-05-28 DIAGNOSIS — I5041 Acute combined systolic (congestive) and diastolic (congestive) heart failure: Secondary | ICD-10-CM | POA: Diagnosis not present

## 2016-05-28 DIAGNOSIS — J441 Chronic obstructive pulmonary disease with (acute) exacerbation: Secondary | ICD-10-CM | POA: Diagnosis not present

## 2016-05-29 DIAGNOSIS — J9600 Acute respiratory failure, unspecified whether with hypoxia or hypercapnia: Secondary | ICD-10-CM | POA: Diagnosis not present

## 2016-05-30 DIAGNOSIS — J9602 Acute respiratory failure with hypercapnia: Secondary | ICD-10-CM | POA: Diagnosis not present

## 2016-05-30 DIAGNOSIS — I5041 Acute combined systolic (congestive) and diastolic (congestive) heart failure: Secondary | ICD-10-CM | POA: Diagnosis not present

## 2016-05-30 DIAGNOSIS — I4891 Unspecified atrial fibrillation: Secondary | ICD-10-CM | POA: Diagnosis not present

## 2016-05-31 DIAGNOSIS — I509 Heart failure, unspecified: Secondary | ICD-10-CM | POA: Diagnosis not present

## 2016-05-31 DIAGNOSIS — J441 Chronic obstructive pulmonary disease with (acute) exacerbation: Secondary | ICD-10-CM | POA: Diagnosis not present

## 2016-05-31 DIAGNOSIS — K219 Gastro-esophageal reflux disease without esophagitis: Secondary | ICD-10-CM | POA: Diagnosis not present

## 2016-05-31 DIAGNOSIS — M6281 Muscle weakness (generalized): Secondary | ICD-10-CM | POA: Diagnosis not present

## 2016-05-31 DIAGNOSIS — E039 Hypothyroidism, unspecified: Secondary | ICD-10-CM | POA: Diagnosis not present

## 2016-05-31 DIAGNOSIS — E873 Alkalosis: Secondary | ICD-10-CM | POA: Diagnosis not present

## 2016-05-31 DIAGNOSIS — J9602 Acute respiratory failure with hypercapnia: Secondary | ICD-10-CM | POA: Diagnosis not present

## 2016-05-31 DIAGNOSIS — Z452 Encounter for adjustment and management of vascular access device: Secondary | ICD-10-CM | POA: Diagnosis not present

## 2016-05-31 DIAGNOSIS — R278 Other lack of coordination: Secondary | ICD-10-CM | POA: Diagnosis not present

## 2016-05-31 DIAGNOSIS — J449 Chronic obstructive pulmonary disease, unspecified: Secondary | ICD-10-CM | POA: Diagnosis not present

## 2016-05-31 DIAGNOSIS — M1990 Unspecified osteoarthritis, unspecified site: Secondary | ICD-10-CM | POA: Diagnosis not present

## 2016-05-31 DIAGNOSIS — E785 Hyperlipidemia, unspecified: Secondary | ICD-10-CM | POA: Diagnosis not present

## 2016-05-31 DIAGNOSIS — J9601 Acute respiratory failure with hypoxia: Secondary | ICD-10-CM | POA: Diagnosis not present

## 2016-05-31 DIAGNOSIS — R2681 Unsteadiness on feet: Secondary | ICD-10-CM | POA: Diagnosis not present

## 2016-05-31 DIAGNOSIS — F419 Anxiety disorder, unspecified: Secondary | ICD-10-CM | POA: Diagnosis not present

## 2016-05-31 DIAGNOSIS — I110 Hypertensive heart disease with heart failure: Secondary | ICD-10-CM | POA: Diagnosis not present

## 2016-05-31 DIAGNOSIS — I5041 Acute combined systolic (congestive) and diastolic (congestive) heart failure: Secondary | ICD-10-CM | POA: Diagnosis not present

## 2016-06-05 DIAGNOSIS — E785 Hyperlipidemia, unspecified: Secondary | ICD-10-CM | POA: Diagnosis not present

## 2016-06-05 DIAGNOSIS — M1990 Unspecified osteoarthritis, unspecified site: Secondary | ICD-10-CM | POA: Diagnosis not present

## 2016-06-05 DIAGNOSIS — J441 Chronic obstructive pulmonary disease with (acute) exacerbation: Secondary | ICD-10-CM | POA: Diagnosis not present

## 2016-06-05 DIAGNOSIS — I110 Hypertensive heart disease with heart failure: Secondary | ICD-10-CM | POA: Diagnosis not present

## 2016-06-05 DIAGNOSIS — F419 Anxiety disorder, unspecified: Secondary | ICD-10-CM | POA: Diagnosis not present

## 2016-06-05 DIAGNOSIS — R2681 Unsteadiness on feet: Secondary | ICD-10-CM | POA: Diagnosis not present

## 2016-06-05 DIAGNOSIS — R278 Other lack of coordination: Secondary | ICD-10-CM | POA: Diagnosis not present

## 2016-06-05 DIAGNOSIS — J9602 Acute respiratory failure with hypercapnia: Secondary | ICD-10-CM | POA: Diagnosis not present

## 2016-06-05 DIAGNOSIS — K219 Gastro-esophageal reflux disease without esophagitis: Secondary | ICD-10-CM | POA: Diagnosis not present

## 2016-06-05 DIAGNOSIS — M6281 Muscle weakness (generalized): Secondary | ICD-10-CM | POA: Diagnosis not present

## 2016-06-05 DIAGNOSIS — I509 Heart failure, unspecified: Secondary | ICD-10-CM | POA: Diagnosis not present

## 2016-06-05 DIAGNOSIS — J9601 Acute respiratory failure with hypoxia: Secondary | ICD-10-CM | POA: Diagnosis not present

## 2016-06-05 DIAGNOSIS — E039 Hypothyroidism, unspecified: Secondary | ICD-10-CM | POA: Diagnosis not present

## 2016-06-09 DIAGNOSIS — Z452 Encounter for adjustment and management of vascular access device: Secondary | ICD-10-CM | POA: Diagnosis not present

## 2016-06-19 DIAGNOSIS — Z9981 Dependence on supplemental oxygen: Secondary | ICD-10-CM | POA: Diagnosis not present

## 2016-06-19 DIAGNOSIS — I110 Hypertensive heart disease with heart failure: Secondary | ICD-10-CM | POA: Diagnosis not present

## 2016-06-19 DIAGNOSIS — Z87891 Personal history of nicotine dependence: Secondary | ICD-10-CM | POA: Diagnosis not present

## 2016-06-19 DIAGNOSIS — I5043 Acute on chronic combined systolic (congestive) and diastolic (congestive) heart failure: Secondary | ICD-10-CM | POA: Diagnosis not present

## 2016-06-19 DIAGNOSIS — E785 Hyperlipidemia, unspecified: Secondary | ICD-10-CM | POA: Diagnosis not present

## 2016-06-19 DIAGNOSIS — J9601 Acute respiratory failure with hypoxia: Secondary | ICD-10-CM | POA: Diagnosis not present

## 2016-06-19 DIAGNOSIS — Z7901 Long term (current) use of anticoagulants: Secondary | ICD-10-CM | POA: Diagnosis not present

## 2016-06-19 DIAGNOSIS — J441 Chronic obstructive pulmonary disease with (acute) exacerbation: Secondary | ICD-10-CM | POA: Diagnosis not present

## 2016-06-19 DIAGNOSIS — J9602 Acute respiratory failure with hypercapnia: Secondary | ICD-10-CM | POA: Diagnosis not present

## 2016-06-19 DIAGNOSIS — M47896 Other spondylosis, lumbar region: Secondary | ICD-10-CM | POA: Diagnosis not present

## 2016-06-22 DIAGNOSIS — E785 Hyperlipidemia, unspecified: Secondary | ICD-10-CM | POA: Diagnosis not present

## 2016-06-22 DIAGNOSIS — Z87891 Personal history of nicotine dependence: Secondary | ICD-10-CM | POA: Diagnosis not present

## 2016-06-22 DIAGNOSIS — J9601 Acute respiratory failure with hypoxia: Secondary | ICD-10-CM | POA: Diagnosis not present

## 2016-06-22 DIAGNOSIS — Z7901 Long term (current) use of anticoagulants: Secondary | ICD-10-CM | POA: Diagnosis not present

## 2016-06-22 DIAGNOSIS — I110 Hypertensive heart disease with heart failure: Secondary | ICD-10-CM | POA: Diagnosis not present

## 2016-06-22 DIAGNOSIS — J9602 Acute respiratory failure with hypercapnia: Secondary | ICD-10-CM | POA: Diagnosis not present

## 2016-06-22 DIAGNOSIS — Z9981 Dependence on supplemental oxygen: Secondary | ICD-10-CM | POA: Diagnosis not present

## 2016-06-22 DIAGNOSIS — M47896 Other spondylosis, lumbar region: Secondary | ICD-10-CM | POA: Diagnosis not present

## 2016-06-22 DIAGNOSIS — J441 Chronic obstructive pulmonary disease with (acute) exacerbation: Secondary | ICD-10-CM | POA: Diagnosis not present

## 2016-06-22 DIAGNOSIS — I5043 Acute on chronic combined systolic (congestive) and diastolic (congestive) heart failure: Secondary | ICD-10-CM | POA: Diagnosis not present

## 2016-06-23 DIAGNOSIS — I480 Paroxysmal atrial fibrillation: Secondary | ICD-10-CM | POA: Diagnosis not present

## 2016-06-23 DIAGNOSIS — Z6836 Body mass index (BMI) 36.0-36.9, adult: Secondary | ICD-10-CM | POA: Diagnosis not present

## 2016-06-30 DIAGNOSIS — J9602 Acute respiratory failure with hypercapnia: Secondary | ICD-10-CM | POA: Diagnosis not present

## 2016-06-30 DIAGNOSIS — I5043 Acute on chronic combined systolic (congestive) and diastolic (congestive) heart failure: Secondary | ICD-10-CM | POA: Diagnosis not present

## 2016-06-30 DIAGNOSIS — J9601 Acute respiratory failure with hypoxia: Secondary | ICD-10-CM | POA: Diagnosis not present

## 2016-06-30 DIAGNOSIS — Z9981 Dependence on supplemental oxygen: Secondary | ICD-10-CM | POA: Diagnosis not present

## 2016-06-30 DIAGNOSIS — I110 Hypertensive heart disease with heart failure: Secondary | ICD-10-CM | POA: Diagnosis not present

## 2016-06-30 DIAGNOSIS — M47896 Other spondylosis, lumbar region: Secondary | ICD-10-CM | POA: Diagnosis not present

## 2016-06-30 DIAGNOSIS — Z87891 Personal history of nicotine dependence: Secondary | ICD-10-CM | POA: Diagnosis not present

## 2016-06-30 DIAGNOSIS — J441 Chronic obstructive pulmonary disease with (acute) exacerbation: Secondary | ICD-10-CM | POA: Diagnosis not present

## 2016-06-30 DIAGNOSIS — E785 Hyperlipidemia, unspecified: Secondary | ICD-10-CM | POA: Diagnosis not present

## 2016-06-30 DIAGNOSIS — Z7901 Long term (current) use of anticoagulants: Secondary | ICD-10-CM | POA: Diagnosis not present

## 2016-07-12 DIAGNOSIS — J449 Chronic obstructive pulmonary disease, unspecified: Secondary | ICD-10-CM | POA: Diagnosis not present

## 2016-08-12 DIAGNOSIS — J449 Chronic obstructive pulmonary disease, unspecified: Secondary | ICD-10-CM | POA: Diagnosis not present

## 2016-09-11 DIAGNOSIS — J449 Chronic obstructive pulmonary disease, unspecified: Secondary | ICD-10-CM | POA: Diagnosis not present

## 2016-09-29 DIAGNOSIS — Z6833 Body mass index (BMI) 33.0-33.9, adult: Secondary | ICD-10-CM | POA: Diagnosis not present

## 2016-09-29 DIAGNOSIS — F411 Generalized anxiety disorder: Secondary | ICD-10-CM | POA: Diagnosis not present

## 2016-09-29 DIAGNOSIS — I10 Essential (primary) hypertension: Secondary | ICD-10-CM | POA: Diagnosis not present

## 2016-09-29 DIAGNOSIS — J441 Chronic obstructive pulmonary disease with (acute) exacerbation: Secondary | ICD-10-CM | POA: Diagnosis not present

## 2016-09-29 DIAGNOSIS — I482 Chronic atrial fibrillation: Secondary | ICD-10-CM | POA: Diagnosis not present

## 2016-09-29 DIAGNOSIS — E038 Other specified hypothyroidism: Secondary | ICD-10-CM | POA: Diagnosis not present

## 2016-09-29 DIAGNOSIS — N182 Chronic kidney disease, stage 2 (mild): Secondary | ICD-10-CM | POA: Diagnosis not present

## 2016-10-12 DIAGNOSIS — J449 Chronic obstructive pulmonary disease, unspecified: Secondary | ICD-10-CM | POA: Diagnosis not present

## 2016-11-12 DIAGNOSIS — J449 Chronic obstructive pulmonary disease, unspecified: Secondary | ICD-10-CM | POA: Diagnosis not present

## 2016-12-12 DIAGNOSIS — J449 Chronic obstructive pulmonary disease, unspecified: Secondary | ICD-10-CM | POA: Diagnosis not present

## 2016-12-30 DIAGNOSIS — E038 Other specified hypothyroidism: Secondary | ICD-10-CM | POA: Diagnosis not present

## 2016-12-30 DIAGNOSIS — I482 Chronic atrial fibrillation: Secondary | ICD-10-CM | POA: Diagnosis not present

## 2016-12-30 DIAGNOSIS — I10 Essential (primary) hypertension: Secondary | ICD-10-CM | POA: Diagnosis not present

## 2016-12-30 DIAGNOSIS — J441 Chronic obstructive pulmonary disease with (acute) exacerbation: Secondary | ICD-10-CM | POA: Diagnosis not present

## 2016-12-30 DIAGNOSIS — Z6832 Body mass index (BMI) 32.0-32.9, adult: Secondary | ICD-10-CM | POA: Diagnosis not present

## 2016-12-30 DIAGNOSIS — F411 Generalized anxiety disorder: Secondary | ICD-10-CM | POA: Diagnosis not present

## 2017-01-24 DIAGNOSIS — Z1231 Encounter for screening mammogram for malignant neoplasm of breast: Secondary | ICD-10-CM | POA: Diagnosis not present

## 2017-02-02 DIAGNOSIS — J441 Chronic obstructive pulmonary disease with (acute) exacerbation: Secondary | ICD-10-CM | POA: Diagnosis not present

## 2017-02-02 DIAGNOSIS — I482 Chronic atrial fibrillation: Secondary | ICD-10-CM | POA: Diagnosis not present

## 2017-02-02 DIAGNOSIS — I10 Essential (primary) hypertension: Secondary | ICD-10-CM | POA: Diagnosis not present

## 2017-02-02 DIAGNOSIS — Z0000 Encounter for general adult medical examination without abnormal findings: Secondary | ICD-10-CM | POA: Diagnosis not present

## 2017-02-02 DIAGNOSIS — Z6832 Body mass index (BMI) 32.0-32.9, adult: Secondary | ICD-10-CM | POA: Diagnosis not present

## 2017-02-02 DIAGNOSIS — Z1389 Encounter for screening for other disorder: Secondary | ICD-10-CM | POA: Diagnosis not present

## 2017-02-02 DIAGNOSIS — E038 Other specified hypothyroidism: Secondary | ICD-10-CM | POA: Diagnosis not present

## 2017-02-02 DIAGNOSIS — F411 Generalized anxiety disorder: Secondary | ICD-10-CM | POA: Diagnosis not present

## 2017-02-11 DIAGNOSIS — J449 Chronic obstructive pulmonary disease, unspecified: Secondary | ICD-10-CM | POA: Diagnosis not present

## 2017-03-14 DIAGNOSIS — J449 Chronic obstructive pulmonary disease, unspecified: Secondary | ICD-10-CM | POA: Diagnosis not present

## 2017-04-03 DIAGNOSIS — Z6831 Body mass index (BMI) 31.0-31.9, adult: Secondary | ICD-10-CM | POA: Diagnosis not present

## 2017-04-03 DIAGNOSIS — Z0000 Encounter for general adult medical examination without abnormal findings: Secondary | ICD-10-CM | POA: Diagnosis not present

## 2017-04-03 DIAGNOSIS — Z6832 Body mass index (BMI) 32.0-32.9, adult: Secondary | ICD-10-CM | POA: Diagnosis not present

## 2017-04-03 DIAGNOSIS — I482 Chronic atrial fibrillation: Secondary | ICD-10-CM | POA: Diagnosis not present

## 2017-04-03 DIAGNOSIS — I10 Essential (primary) hypertension: Secondary | ICD-10-CM | POA: Diagnosis not present

## 2017-04-03 DIAGNOSIS — J441 Chronic obstructive pulmonary disease with (acute) exacerbation: Secondary | ICD-10-CM | POA: Diagnosis not present

## 2017-04-03 DIAGNOSIS — E038 Other specified hypothyroidism: Secondary | ICD-10-CM | POA: Diagnosis not present

## 2017-04-03 DIAGNOSIS — Z1389 Encounter for screening for other disorder: Secondary | ICD-10-CM | POA: Diagnosis not present

## 2017-04-03 DIAGNOSIS — R69 Illness, unspecified: Secondary | ICD-10-CM | POA: Diagnosis not present

## 2017-04-13 DIAGNOSIS — M810 Age-related osteoporosis without current pathological fracture: Secondary | ICD-10-CM | POA: Diagnosis not present

## 2017-04-13 DIAGNOSIS — M85851 Other specified disorders of bone density and structure, right thigh: Secondary | ICD-10-CM | POA: Diagnosis not present

## 2017-04-13 DIAGNOSIS — M8588 Other specified disorders of bone density and structure, other site: Secondary | ICD-10-CM | POA: Diagnosis not present

## 2017-04-14 DIAGNOSIS — J449 Chronic obstructive pulmonary disease, unspecified: Secondary | ICD-10-CM | POA: Diagnosis not present

## 2017-04-30 DIAGNOSIS — R69 Illness, unspecified: Secondary | ICD-10-CM | POA: Diagnosis not present

## 2017-05-12 DIAGNOSIS — J449 Chronic obstructive pulmonary disease, unspecified: Secondary | ICD-10-CM | POA: Diagnosis not present

## 2017-06-12 DIAGNOSIS — J449 Chronic obstructive pulmonary disease, unspecified: Secondary | ICD-10-CM | POA: Diagnosis not present

## 2017-06-18 DIAGNOSIS — J449 Chronic obstructive pulmonary disease, unspecified: Secondary | ICD-10-CM | POA: Diagnosis not present

## 2017-06-18 DIAGNOSIS — Z780 Asymptomatic menopausal state: Secondary | ICD-10-CM | POA: Diagnosis not present

## 2017-06-18 DIAGNOSIS — J441 Chronic obstructive pulmonary disease with (acute) exacerbation: Secondary | ICD-10-CM | POA: Diagnosis not present

## 2017-06-18 DIAGNOSIS — J189 Pneumonia, unspecified organism: Secondary | ICD-10-CM | POA: Diagnosis not present

## 2017-06-18 DIAGNOSIS — I129 Hypertensive chronic kidney disease with stage 1 through stage 4 chronic kidney disease, or unspecified chronic kidney disease: Secondary | ICD-10-CM | POA: Diagnosis not present

## 2017-06-18 DIAGNOSIS — E785 Hyperlipidemia, unspecified: Secondary | ICD-10-CM | POA: Diagnosis not present

## 2017-06-18 DIAGNOSIS — J9601 Acute respiratory failure with hypoxia: Secondary | ICD-10-CM | POA: Diagnosis not present

## 2017-06-18 DIAGNOSIS — R0602 Shortness of breath: Secondary | ICD-10-CM | POA: Diagnosis not present

## 2017-06-18 DIAGNOSIS — J9602 Acute respiratory failure with hypercapnia: Secondary | ICD-10-CM | POA: Diagnosis not present

## 2017-06-18 DIAGNOSIS — E039 Hypothyroidism, unspecified: Secondary | ICD-10-CM | POA: Diagnosis not present

## 2017-06-18 DIAGNOSIS — J168 Pneumonia due to other specified infectious organisms: Secondary | ICD-10-CM | POA: Diagnosis not present

## 2017-06-18 DIAGNOSIS — J440 Chronic obstructive pulmonary disease with acute lower respiratory infection: Secondary | ICD-10-CM | POA: Diagnosis not present

## 2017-06-18 DIAGNOSIS — M1990 Unspecified osteoarthritis, unspecified site: Secondary | ICD-10-CM | POA: Diagnosis not present

## 2017-06-29 DIAGNOSIS — Z6831 Body mass index (BMI) 31.0-31.9, adult: Secondary | ICD-10-CM | POA: Diagnosis not present

## 2017-06-29 DIAGNOSIS — J441 Chronic obstructive pulmonary disease with (acute) exacerbation: Secondary | ICD-10-CM | POA: Diagnosis not present

## 2017-07-06 DIAGNOSIS — I10 Essential (primary) hypertension: Secondary | ICD-10-CM | POA: Diagnosis not present

## 2017-07-06 DIAGNOSIS — Z6832 Body mass index (BMI) 32.0-32.9, adult: Secondary | ICD-10-CM | POA: Diagnosis not present

## 2017-07-06 DIAGNOSIS — J441 Chronic obstructive pulmonary disease with (acute) exacerbation: Secondary | ICD-10-CM | POA: Diagnosis not present

## 2017-07-12 DIAGNOSIS — J449 Chronic obstructive pulmonary disease, unspecified: Secondary | ICD-10-CM | POA: Diagnosis not present

## 2017-08-12 DIAGNOSIS — J449 Chronic obstructive pulmonary disease, unspecified: Secondary | ICD-10-CM | POA: Diagnosis not present

## 2017-09-11 DIAGNOSIS — J449 Chronic obstructive pulmonary disease, unspecified: Secondary | ICD-10-CM | POA: Diagnosis not present

## 2017-09-12 DIAGNOSIS — I10 Essential (primary) hypertension: Secondary | ICD-10-CM | POA: Diagnosis not present

## 2017-09-12 DIAGNOSIS — E669 Obesity, unspecified: Secondary | ICD-10-CM | POA: Diagnosis not present

## 2017-09-12 DIAGNOSIS — M1990 Unspecified osteoarthritis, unspecified site: Secondary | ICD-10-CM | POA: Diagnosis not present

## 2017-09-12 DIAGNOSIS — I4891 Unspecified atrial fibrillation: Secondary | ICD-10-CM | POA: Diagnosis not present

## 2017-09-12 DIAGNOSIS — R69 Illness, unspecified: Secondary | ICD-10-CM | POA: Diagnosis not present

## 2017-09-12 DIAGNOSIS — K219 Gastro-esophageal reflux disease without esophagitis: Secondary | ICD-10-CM | POA: Diagnosis not present

## 2017-09-12 DIAGNOSIS — E785 Hyperlipidemia, unspecified: Secondary | ICD-10-CM | POA: Diagnosis not present

## 2017-09-12 DIAGNOSIS — E039 Hypothyroidism, unspecified: Secondary | ICD-10-CM | POA: Diagnosis not present

## 2017-09-12 DIAGNOSIS — J449 Chronic obstructive pulmonary disease, unspecified: Secondary | ICD-10-CM | POA: Diagnosis not present

## 2017-10-12 DIAGNOSIS — J449 Chronic obstructive pulmonary disease, unspecified: Secondary | ICD-10-CM | POA: Diagnosis not present

## 2017-10-17 DIAGNOSIS — E782 Mixed hyperlipidemia: Secondary | ICD-10-CM | POA: Diagnosis not present

## 2017-10-17 DIAGNOSIS — R69 Illness, unspecified: Secondary | ICD-10-CM | POA: Diagnosis not present

## 2017-10-17 DIAGNOSIS — I10 Essential (primary) hypertension: Secondary | ICD-10-CM | POA: Diagnosis not present

## 2017-10-17 DIAGNOSIS — K219 Gastro-esophageal reflux disease without esophagitis: Secondary | ICD-10-CM | POA: Diagnosis not present

## 2017-10-17 DIAGNOSIS — E119 Type 2 diabetes mellitus without complications: Secondary | ICD-10-CM | POA: Diagnosis not present

## 2017-10-17 DIAGNOSIS — Z6832 Body mass index (BMI) 32.0-32.9, adult: Secondary | ICD-10-CM | POA: Diagnosis not present

## 2017-10-17 DIAGNOSIS — J441 Chronic obstructive pulmonary disease with (acute) exacerbation: Secondary | ICD-10-CM | POA: Diagnosis not present

## 2017-11-12 DIAGNOSIS — J449 Chronic obstructive pulmonary disease, unspecified: Secondary | ICD-10-CM | POA: Diagnosis not present

## 2017-11-28 DIAGNOSIS — R69 Illness, unspecified: Secondary | ICD-10-CM | POA: Diagnosis not present

## 2017-12-12 DIAGNOSIS — J449 Chronic obstructive pulmonary disease, unspecified: Secondary | ICD-10-CM | POA: Diagnosis not present

## 2018-01-12 DIAGNOSIS — J449 Chronic obstructive pulmonary disease, unspecified: Secondary | ICD-10-CM | POA: Diagnosis not present

## 2018-01-23 DIAGNOSIS — K219 Gastro-esophageal reflux disease without esophagitis: Secondary | ICD-10-CM | POA: Diagnosis not present

## 2018-01-23 DIAGNOSIS — N182 Chronic kidney disease, stage 2 (mild): Secondary | ICD-10-CM | POA: Diagnosis not present

## 2018-01-23 DIAGNOSIS — Z6831 Body mass index (BMI) 31.0-31.9, adult: Secondary | ICD-10-CM | POA: Diagnosis not present

## 2018-01-23 DIAGNOSIS — J449 Chronic obstructive pulmonary disease, unspecified: Secondary | ICD-10-CM | POA: Diagnosis not present

## 2018-01-23 DIAGNOSIS — E1121 Type 2 diabetes mellitus with diabetic nephropathy: Secondary | ICD-10-CM | POA: Diagnosis not present

## 2018-01-23 DIAGNOSIS — I10 Essential (primary) hypertension: Secondary | ICD-10-CM | POA: Diagnosis not present

## 2018-01-23 DIAGNOSIS — E782 Mixed hyperlipidemia: Secondary | ICD-10-CM | POA: Diagnosis not present

## 2018-01-23 DIAGNOSIS — R69 Illness, unspecified: Secondary | ICD-10-CM | POA: Diagnosis not present

## 2018-02-11 DIAGNOSIS — J449 Chronic obstructive pulmonary disease, unspecified: Secondary | ICD-10-CM | POA: Diagnosis not present

## 2018-03-14 DIAGNOSIS — J449 Chronic obstructive pulmonary disease, unspecified: Secondary | ICD-10-CM | POA: Diagnosis not present

## 2018-04-14 DIAGNOSIS — J449 Chronic obstructive pulmonary disease, unspecified: Secondary | ICD-10-CM | POA: Diagnosis not present

## 2018-05-02 DIAGNOSIS — J449 Chronic obstructive pulmonary disease, unspecified: Secondary | ICD-10-CM | POA: Diagnosis not present

## 2018-05-02 DIAGNOSIS — K219 Gastro-esophageal reflux disease without esophagitis: Secondary | ICD-10-CM | POA: Diagnosis not present

## 2018-05-02 DIAGNOSIS — Z6829 Body mass index (BMI) 29.0-29.9, adult: Secondary | ICD-10-CM | POA: Diagnosis not present

## 2018-05-02 DIAGNOSIS — R69 Illness, unspecified: Secondary | ICD-10-CM | POA: Diagnosis not present

## 2018-05-02 DIAGNOSIS — N182 Chronic kidney disease, stage 2 (mild): Secondary | ICD-10-CM | POA: Diagnosis not present

## 2018-05-02 DIAGNOSIS — I10 Essential (primary) hypertension: Secondary | ICD-10-CM | POA: Diagnosis not present

## 2018-05-02 DIAGNOSIS — E1121 Type 2 diabetes mellitus with diabetic nephropathy: Secondary | ICD-10-CM | POA: Diagnosis not present

## 2018-05-02 DIAGNOSIS — E782 Mixed hyperlipidemia: Secondary | ICD-10-CM | POA: Diagnosis not present

## 2018-05-13 DIAGNOSIS — J449 Chronic obstructive pulmonary disease, unspecified: Secondary | ICD-10-CM | POA: Diagnosis not present

## 2018-05-18 DIAGNOSIS — E119 Type 2 diabetes mellitus without complications: Secondary | ICD-10-CM | POA: Diagnosis not present

## 2018-05-18 DIAGNOSIS — H2513 Age-related nuclear cataract, bilateral: Secondary | ICD-10-CM | POA: Diagnosis not present

## 2018-05-18 DIAGNOSIS — Z7984 Long term (current) use of oral hypoglycemic drugs: Secondary | ICD-10-CM | POA: Diagnosis not present

## 2018-06-13 DIAGNOSIS — J449 Chronic obstructive pulmonary disease, unspecified: Secondary | ICD-10-CM | POA: Diagnosis not present

## 2018-06-28 DIAGNOSIS — J441 Chronic obstructive pulmonary disease with (acute) exacerbation: Secondary | ICD-10-CM | POA: Diagnosis not present

## 2018-06-28 DIAGNOSIS — M542 Cervicalgia: Secondary | ICD-10-CM | POA: Diagnosis not present

## 2018-06-28 DIAGNOSIS — M47812 Spondylosis without myelopathy or radiculopathy, cervical region: Secondary | ICD-10-CM | POA: Diagnosis not present

## 2018-06-28 DIAGNOSIS — Z6829 Body mass index (BMI) 29.0-29.9, adult: Secondary | ICD-10-CM | POA: Diagnosis not present

## 2018-07-11 DIAGNOSIS — Z6828 Body mass index (BMI) 28.0-28.9, adult: Secondary | ICD-10-CM | POA: Diagnosis not present

## 2018-07-11 DIAGNOSIS — M542 Cervicalgia: Secondary | ICD-10-CM | POA: Diagnosis not present

## 2018-07-13 DIAGNOSIS — J449 Chronic obstructive pulmonary disease, unspecified: Secondary | ICD-10-CM | POA: Diagnosis not present

## 2018-07-18 DIAGNOSIS — M5030 Other cervical disc degeneration, unspecified cervical region: Secondary | ICD-10-CM | POA: Diagnosis not present

## 2018-07-18 DIAGNOSIS — M542 Cervicalgia: Secondary | ICD-10-CM | POA: Diagnosis not present

## 2018-07-18 DIAGNOSIS — M4802 Spinal stenosis, cervical region: Secondary | ICD-10-CM | POA: Diagnosis not present

## 2018-07-25 ENCOUNTER — Other Ambulatory Visit: Payer: Self-pay | Admitting: Internal Medicine

## 2018-07-25 DIAGNOSIS — M542 Cervicalgia: Secondary | ICD-10-CM

## 2018-08-07 DIAGNOSIS — J449 Chronic obstructive pulmonary disease, unspecified: Secondary | ICD-10-CM | POA: Diagnosis not present

## 2018-08-07 DIAGNOSIS — I482 Chronic atrial fibrillation, unspecified: Secondary | ICD-10-CM | POA: Diagnosis not present

## 2018-08-07 DIAGNOSIS — Z0000 Encounter for general adult medical examination without abnormal findings: Secondary | ICD-10-CM | POA: Diagnosis not present

## 2018-08-07 DIAGNOSIS — Z1389 Encounter for screening for other disorder: Secondary | ICD-10-CM | POA: Diagnosis not present

## 2018-08-07 DIAGNOSIS — Z6827 Body mass index (BMI) 27.0-27.9, adult: Secondary | ICD-10-CM | POA: Diagnosis not present

## 2018-08-07 DIAGNOSIS — N182 Chronic kidney disease, stage 2 (mild): Secondary | ICD-10-CM | POA: Diagnosis not present

## 2018-08-07 DIAGNOSIS — I10 Essential (primary) hypertension: Secondary | ICD-10-CM | POA: Diagnosis not present

## 2018-08-07 DIAGNOSIS — E1121 Type 2 diabetes mellitus with diabetic nephropathy: Secondary | ICD-10-CM | POA: Diagnosis not present

## 2018-08-07 DIAGNOSIS — E785 Hyperlipidemia, unspecified: Secondary | ICD-10-CM | POA: Diagnosis not present

## 2018-08-13 DIAGNOSIS — J449 Chronic obstructive pulmonary disease, unspecified: Secondary | ICD-10-CM | POA: Diagnosis not present

## 2018-08-31 ENCOUNTER — Ambulatory Visit
Admission: RE | Admit: 2018-08-31 | Discharge: 2018-08-31 | Disposition: A | Discharge status: Home / Self Care | Payer: Medicare HMO | Source: Ambulatory Visit | Attending: Internal Medicine | Admitting: Internal Medicine

## 2018-08-31 ENCOUNTER — Other Ambulatory Visit: Payer: Self-pay

## 2018-08-31 DIAGNOSIS — M542 Cervicalgia: Secondary | ICD-10-CM | POA: Diagnosis not present

## 2018-08-31 MED ORDER — IOPAMIDOL (ISOVUE-M 300) INJECTION 61%
1.0000 mL | Freq: Once | INTRAMUSCULAR | Status: AC | PRN
  Administered 2018-08-31: 1 mL via EPIDURAL

## 2018-08-31 MED ORDER — TRIAMCINOLONE ACETONIDE 40 MG/ML IJ SUSP (RADIOLOGY)
60.0000 mg | Freq: Once | INTRAMUSCULAR | Status: AC
  Administered 2018-08-31: 60 mg via EPIDURAL

## 2018-08-31 NOTE — Discharge Instructions (Signed)

## 2018-09-12 DIAGNOSIS — J449 Chronic obstructive pulmonary disease, unspecified: Secondary | ICD-10-CM | POA: Diagnosis not present

## 2018-10-13 DIAGNOSIS — J449 Chronic obstructive pulmonary disease, unspecified: Secondary | ICD-10-CM | POA: Diagnosis not present

## 2018-11-07 DIAGNOSIS — R69 Illness, unspecified: Secondary | ICD-10-CM | POA: Diagnosis not present

## 2018-11-13 DIAGNOSIS — J449 Chronic obstructive pulmonary disease, unspecified: Secondary | ICD-10-CM | POA: Diagnosis not present

## 2018-11-14 DIAGNOSIS — Z6827 Body mass index (BMI) 27.0-27.9, adult: Secondary | ICD-10-CM | POA: Diagnosis not present

## 2018-11-14 DIAGNOSIS — N182 Chronic kidney disease, stage 2 (mild): Secondary | ICD-10-CM | POA: Diagnosis not present

## 2018-11-14 DIAGNOSIS — J449 Chronic obstructive pulmonary disease, unspecified: Secondary | ICD-10-CM | POA: Diagnosis not present

## 2018-11-14 DIAGNOSIS — I10 Essential (primary) hypertension: Secondary | ICD-10-CM | POA: Diagnosis not present

## 2018-11-14 DIAGNOSIS — E782 Mixed hyperlipidemia: Secondary | ICD-10-CM | POA: Diagnosis not present

## 2018-11-14 DIAGNOSIS — I480 Paroxysmal atrial fibrillation: Secondary | ICD-10-CM | POA: Diagnosis not present

## 2018-11-14 DIAGNOSIS — E1121 Type 2 diabetes mellitus with diabetic nephropathy: Secondary | ICD-10-CM | POA: Diagnosis not present

## 2018-12-13 DIAGNOSIS — J449 Chronic obstructive pulmonary disease, unspecified: Secondary | ICD-10-CM | POA: Diagnosis not present

## 2019-01-13 DIAGNOSIS — J449 Chronic obstructive pulmonary disease, unspecified: Secondary | ICD-10-CM | POA: Diagnosis not present

## 2019-02-12 DIAGNOSIS — J449 Chronic obstructive pulmonary disease, unspecified: Secondary | ICD-10-CM | POA: Diagnosis not present

## 2019-02-18 DIAGNOSIS — E1121 Type 2 diabetes mellitus with diabetic nephropathy: Secondary | ICD-10-CM | POA: Diagnosis not present

## 2019-02-18 DIAGNOSIS — I10 Essential (primary) hypertension: Secondary | ICD-10-CM | POA: Diagnosis not present

## 2019-02-18 DIAGNOSIS — E782 Mixed hyperlipidemia: Secondary | ICD-10-CM | POA: Diagnosis not present

## 2019-02-18 DIAGNOSIS — J449 Chronic obstructive pulmonary disease, unspecified: Secondary | ICD-10-CM | POA: Diagnosis not present

## 2019-02-18 DIAGNOSIS — N182 Chronic kidney disease, stage 2 (mild): Secondary | ICD-10-CM | POA: Diagnosis not present

## 2019-02-18 DIAGNOSIS — Z6827 Body mass index (BMI) 27.0-27.9, adult: Secondary | ICD-10-CM | POA: Diagnosis not present

## 2019-02-18 DIAGNOSIS — I480 Paroxysmal atrial fibrillation: Secondary | ICD-10-CM | POA: Diagnosis not present

## 2019-03-15 DIAGNOSIS — J449 Chronic obstructive pulmonary disease, unspecified: Secondary | ICD-10-CM | POA: Diagnosis not present

## 2019-04-15 DIAGNOSIS — J449 Chronic obstructive pulmonary disease, unspecified: Secondary | ICD-10-CM | POA: Diagnosis not present

## 2019-05-13 DIAGNOSIS — J449 Chronic obstructive pulmonary disease, unspecified: Secondary | ICD-10-CM | POA: Diagnosis not present

## 2019-05-28 DIAGNOSIS — N182 Chronic kidney disease, stage 2 (mild): Secondary | ICD-10-CM | POA: Diagnosis not present

## 2019-05-28 DIAGNOSIS — E782 Mixed hyperlipidemia: Secondary | ICD-10-CM | POA: Diagnosis not present

## 2019-05-28 DIAGNOSIS — E1121 Type 2 diabetes mellitus with diabetic nephropathy: Secondary | ICD-10-CM | POA: Diagnosis not present

## 2019-05-28 DIAGNOSIS — Z6826 Body mass index (BMI) 26.0-26.9, adult: Secondary | ICD-10-CM | POA: Diagnosis not present

## 2019-05-28 DIAGNOSIS — I10 Essential (primary) hypertension: Secondary | ICD-10-CM | POA: Diagnosis not present

## 2019-05-28 DIAGNOSIS — I480 Paroxysmal atrial fibrillation: Secondary | ICD-10-CM | POA: Diagnosis not present

## 2019-05-28 DIAGNOSIS — J449 Chronic obstructive pulmonary disease, unspecified: Secondary | ICD-10-CM | POA: Diagnosis not present

## 2019-06-03 ENCOUNTER — Other Ambulatory Visit (HOSPITAL_COMMUNITY): Payer: Self-pay | Admitting: Respiratory Therapy

## 2019-06-03 DIAGNOSIS — J449 Chronic obstructive pulmonary disease, unspecified: Secondary | ICD-10-CM

## 2019-06-07 ENCOUNTER — Other Ambulatory Visit (HOSPITAL_COMMUNITY)
Admission: RE | Admit: 2019-06-07 | Discharge: 2019-06-07 | Disposition: A | Discharge status: Home / Self Care | Payer: Medicare HMO | Source: Ambulatory Visit | Attending: Internal Medicine | Admitting: Internal Medicine

## 2019-06-07 DIAGNOSIS — Z01812 Encounter for preprocedural laboratory examination: Secondary | ICD-10-CM | POA: Insufficient documentation

## 2019-06-07 DIAGNOSIS — Z20822 Contact with and (suspected) exposure to covid-19: Secondary | ICD-10-CM | POA: Insufficient documentation

## 2019-06-08 LAB — SARS CORONAVIRUS 2 (TAT 6-24 HRS): SARS Coronavirus 2: NEGATIVE

## 2019-06-10 DIAGNOSIS — I739 Peripheral vascular disease, unspecified: Secondary | ICD-10-CM | POA: Diagnosis not present

## 2019-06-11 ENCOUNTER — Other Ambulatory Visit: Payer: Self-pay

## 2019-06-11 ENCOUNTER — Ambulatory Visit (HOSPITAL_COMMUNITY)
Admission: RE | Admit: 2019-06-11 | Discharge: 2019-06-11 | Disposition: A | Discharge status: Home / Self Care | Payer: Medicare HMO | Source: Ambulatory Visit | Attending: Internal Medicine | Admitting: Internal Medicine

## 2019-06-11 DIAGNOSIS — J449 Chronic obstructive pulmonary disease, unspecified: Secondary | ICD-10-CM | POA: Insufficient documentation

## 2019-06-11 LAB — PULMONARY FUNCTION TEST
FEF 25-75 Pre: 0.58 L/sec
FEF2575-%Pred-Pre: 31 %
FEV1-%Pred-Pre: 55 %
FEV1-Pre: 1.15 L
FEV1FVC-%Pred-Pre: 76 %
FEV6-%Pred-Pre: 73 %
FEV6-Pre: 1.93 L
FEV6FVC-%Pred-Pre: 103 %
FVC-%Pred-Pre: 71 %
FVC-Pre: 1.96 L
Pre FEV1/FVC ratio: 59 %
Pre FEV6/FVC Ratio: 99 %
RV % pred: 214 %
RV: 4.25 L
TLC % pred: 138 %
TLC: 6.37 L

## 2019-06-13 DIAGNOSIS — J449 Chronic obstructive pulmonary disease, unspecified: Secondary | ICD-10-CM | POA: Diagnosis not present

## 2019-07-13 DIAGNOSIS — J449 Chronic obstructive pulmonary disease, unspecified: Secondary | ICD-10-CM | POA: Diagnosis not present

## 2019-08-08 DIAGNOSIS — Z1231 Encounter for screening mammogram for malignant neoplasm of breast: Secondary | ICD-10-CM | POA: Diagnosis not present

## 2019-08-13 DIAGNOSIS — J449 Chronic obstructive pulmonary disease, unspecified: Secondary | ICD-10-CM | POA: Diagnosis not present

## 2019-08-28 DIAGNOSIS — I10 Essential (primary) hypertension: Secondary | ICD-10-CM | POA: Diagnosis not present

## 2019-08-28 DIAGNOSIS — E782 Mixed hyperlipidemia: Secondary | ICD-10-CM | POA: Diagnosis not present

## 2019-08-28 DIAGNOSIS — Z0000 Encounter for general adult medical examination without abnormal findings: Secondary | ICD-10-CM | POA: Diagnosis not present

## 2019-08-28 DIAGNOSIS — N182 Chronic kidney disease, stage 2 (mild): Secondary | ICD-10-CM | POA: Diagnosis not present

## 2019-08-28 DIAGNOSIS — I480 Paroxysmal atrial fibrillation: Secondary | ICD-10-CM | POA: Diagnosis not present

## 2019-08-28 DIAGNOSIS — Z6826 Body mass index (BMI) 26.0-26.9, adult: Secondary | ICD-10-CM | POA: Diagnosis not present

## 2019-08-28 DIAGNOSIS — J449 Chronic obstructive pulmonary disease, unspecified: Secondary | ICD-10-CM | POA: Diagnosis not present

## 2019-08-28 DIAGNOSIS — Z6825 Body mass index (BMI) 25.0-25.9, adult: Secondary | ICD-10-CM | POA: Diagnosis not present

## 2019-08-28 DIAGNOSIS — Z1331 Encounter for screening for depression: Secondary | ICD-10-CM | POA: Diagnosis not present

## 2019-08-28 DIAGNOSIS — E1121 Type 2 diabetes mellitus with diabetic nephropathy: Secondary | ICD-10-CM | POA: Diagnosis not present

## 2019-09-10 DIAGNOSIS — E119 Type 2 diabetes mellitus without complications: Secondary | ICD-10-CM | POA: Diagnosis not present

## 2019-09-10 DIAGNOSIS — E7800 Pure hypercholesterolemia, unspecified: Secondary | ICD-10-CM | POA: Diagnosis not present

## 2019-09-10 DIAGNOSIS — H5200 Hypermetropia, unspecified eye: Secondary | ICD-10-CM | POA: Diagnosis not present

## 2019-09-10 DIAGNOSIS — H2513 Age-related nuclear cataract, bilateral: Secondary | ICD-10-CM | POA: Diagnosis not present

## 2019-09-10 DIAGNOSIS — I10 Essential (primary) hypertension: Secondary | ICD-10-CM | POA: Diagnosis not present

## 2019-09-10 DIAGNOSIS — Z0100 Encounter for examination of eyes and vision without abnormal findings: Secondary | ICD-10-CM | POA: Diagnosis not present

## 2019-09-12 DIAGNOSIS — J449 Chronic obstructive pulmonary disease, unspecified: Secondary | ICD-10-CM | POA: Diagnosis not present

## 2019-09-24 DIAGNOSIS — R69 Illness, unspecified: Secondary | ICD-10-CM | POA: Diagnosis not present

## 2019-10-13 DIAGNOSIS — J449 Chronic obstructive pulmonary disease, unspecified: Secondary | ICD-10-CM | POA: Diagnosis not present

## 2019-11-13 DIAGNOSIS — J449 Chronic obstructive pulmonary disease, unspecified: Secondary | ICD-10-CM | POA: Diagnosis not present

## 2019-11-28 DIAGNOSIS — I10 Essential (primary) hypertension: Secondary | ICD-10-CM | POA: Diagnosis not present

## 2019-11-28 DIAGNOSIS — Z6824 Body mass index (BMI) 24.0-24.9, adult: Secondary | ICD-10-CM | POA: Diagnosis not present

## 2019-11-28 DIAGNOSIS — I700 Atherosclerosis of aorta: Secondary | ICD-10-CM | POA: Diagnosis not present

## 2019-11-28 DIAGNOSIS — E1121 Type 2 diabetes mellitus with diabetic nephropathy: Secondary | ICD-10-CM | POA: Diagnosis not present

## 2019-11-28 DIAGNOSIS — E782 Mixed hyperlipidemia: Secondary | ICD-10-CM | POA: Diagnosis not present

## 2019-11-28 DIAGNOSIS — I480 Paroxysmal atrial fibrillation: Secondary | ICD-10-CM | POA: Diagnosis not present

## 2019-11-28 DIAGNOSIS — J449 Chronic obstructive pulmonary disease, unspecified: Secondary | ICD-10-CM | POA: Diagnosis not present

## 2019-11-28 DIAGNOSIS — N182 Chronic kidney disease, stage 2 (mild): Secondary | ICD-10-CM | POA: Diagnosis not present

## 2019-12-13 DIAGNOSIS — J449 Chronic obstructive pulmonary disease, unspecified: Secondary | ICD-10-CM | POA: Diagnosis not present

## 2019-12-15 DIAGNOSIS — R69 Illness, unspecified: Secondary | ICD-10-CM | POA: Diagnosis not present

## 2020-01-13 DIAGNOSIS — J449 Chronic obstructive pulmonary disease, unspecified: Secondary | ICD-10-CM | POA: Diagnosis not present

## 2020-02-12 DIAGNOSIS — J449 Chronic obstructive pulmonary disease, unspecified: Secondary | ICD-10-CM | POA: Diagnosis not present

## 2020-02-27 DIAGNOSIS — E782 Mixed hyperlipidemia: Secondary | ICD-10-CM | POA: Diagnosis not present

## 2020-02-27 DIAGNOSIS — E038 Other specified hypothyroidism: Secondary | ICD-10-CM | POA: Diagnosis not present

## 2020-02-27 DIAGNOSIS — I700 Atherosclerosis of aorta: Secondary | ICD-10-CM | POA: Diagnosis not present

## 2020-02-27 DIAGNOSIS — I480 Paroxysmal atrial fibrillation: Secondary | ICD-10-CM | POA: Diagnosis not present

## 2020-02-27 DIAGNOSIS — J449 Chronic obstructive pulmonary disease, unspecified: Secondary | ICD-10-CM | POA: Diagnosis not present

## 2020-02-27 DIAGNOSIS — Z6823 Body mass index (BMI) 23.0-23.9, adult: Secondary | ICD-10-CM | POA: Diagnosis not present

## 2020-02-27 DIAGNOSIS — N182 Chronic kidney disease, stage 2 (mild): Secondary | ICD-10-CM | POA: Diagnosis not present

## 2020-02-27 DIAGNOSIS — Z79899 Other long term (current) drug therapy: Secondary | ICD-10-CM | POA: Diagnosis not present

## 2020-02-27 DIAGNOSIS — E1121 Type 2 diabetes mellitus with diabetic nephropathy: Secondary | ICD-10-CM | POA: Diagnosis not present

## 2020-02-27 DIAGNOSIS — I10 Essential (primary) hypertension: Secondary | ICD-10-CM | POA: Diagnosis not present

## 2020-03-03 DIAGNOSIS — E2839 Other primary ovarian failure: Secondary | ICD-10-CM | POA: Diagnosis not present

## 2020-03-03 DIAGNOSIS — M810 Age-related osteoporosis without current pathological fracture: Secondary | ICD-10-CM | POA: Diagnosis not present

## 2020-03-14 DIAGNOSIS — J449 Chronic obstructive pulmonary disease, unspecified: Secondary | ICD-10-CM | POA: Diagnosis not present

## 2020-04-14 DIAGNOSIS — J449 Chronic obstructive pulmonary disease, unspecified: Secondary | ICD-10-CM | POA: Diagnosis not present

## 2020-05-12 DIAGNOSIS — J449 Chronic obstructive pulmonary disease, unspecified: Secondary | ICD-10-CM | POA: Diagnosis not present

## 2020-05-28 DIAGNOSIS — Z6821 Body mass index (BMI) 21.0-21.9, adult: Secondary | ICD-10-CM | POA: Diagnosis not present

## 2020-05-28 DIAGNOSIS — N182 Chronic kidney disease, stage 2 (mild): Secondary | ICD-10-CM | POA: Diagnosis not present

## 2020-05-28 DIAGNOSIS — E782 Mixed hyperlipidemia: Secondary | ICD-10-CM | POA: Diagnosis not present

## 2020-05-28 DIAGNOSIS — I10 Essential (primary) hypertension: Secondary | ICD-10-CM | POA: Diagnosis not present

## 2020-05-28 DIAGNOSIS — R630 Anorexia: Secondary | ICD-10-CM | POA: Diagnosis not present

## 2020-05-28 DIAGNOSIS — Z1159 Encounter for screening for other viral diseases: Secondary | ICD-10-CM | POA: Diagnosis not present

## 2020-05-28 DIAGNOSIS — R634 Abnormal weight loss: Secondary | ICD-10-CM | POA: Diagnosis not present

## 2020-05-28 DIAGNOSIS — E1121 Type 2 diabetes mellitus with diabetic nephropathy: Secondary | ICD-10-CM | POA: Diagnosis not present

## 2020-05-28 DIAGNOSIS — I480 Paroxysmal atrial fibrillation: Secondary | ICD-10-CM | POA: Diagnosis not present

## 2020-05-28 DIAGNOSIS — I700 Atherosclerosis of aorta: Secondary | ICD-10-CM | POA: Diagnosis not present

## 2020-05-28 DIAGNOSIS — J449 Chronic obstructive pulmonary disease, unspecified: Secondary | ICD-10-CM | POA: Diagnosis not present

## 2020-05-28 DIAGNOSIS — R0602 Shortness of breath: Secondary | ICD-10-CM | POA: Diagnosis not present

## 2020-06-01 ENCOUNTER — Encounter (INDEPENDENT_AMBULATORY_CARE_PROVIDER_SITE_OTHER): Payer: Self-pay | Admitting: *Deleted

## 2020-06-04 ENCOUNTER — Other Ambulatory Visit: Payer: Self-pay

## 2020-06-04 ENCOUNTER — Encounter (HOSPITAL_COMMUNITY): Payer: Self-pay | Admitting: Emergency Medicine

## 2020-06-04 ENCOUNTER — Emergency Department (HOSPITAL_COMMUNITY)
Admission: EM | Admit: 2020-06-04 | Discharge: 2020-06-04 | Disposition: A | Discharge status: Home / Self Care | Payer: Medicare HMO | Attending: Emergency Medicine | Admitting: Emergency Medicine

## 2020-06-04 DIAGNOSIS — Z5321 Procedure and treatment not carried out due to patient leaving prior to being seen by health care provider: Secondary | ICD-10-CM | POA: Diagnosis not present

## 2020-06-04 DIAGNOSIS — R1030 Lower abdominal pain, unspecified: Secondary | ICD-10-CM | POA: Insufficient documentation

## 2020-06-04 DIAGNOSIS — R197 Diarrhea, unspecified: Secondary | ICD-10-CM | POA: Diagnosis not present

## 2020-06-04 HISTORY — DX: Chronic obstructive pulmonary disease, unspecified: J44.9

## 2020-06-04 HISTORY — DX: Heart failure, unspecified: I50.9

## 2020-06-04 HISTORY — DX: Essential (primary) hypertension: I10

## 2020-06-04 HISTORY — DX: Type 2 diabetes mellitus without complications: E11.9

## 2020-06-04 NOTE — ED Triage Notes (Signed)
Pt c/o lower abd pain with diarrhea x 1 week

## 2020-06-05 DIAGNOSIS — K529 Noninfective gastroenteritis and colitis, unspecified: Secondary | ICD-10-CM | POA: Diagnosis not present

## 2020-06-05 DIAGNOSIS — R1032 Left lower quadrant pain: Secondary | ICD-10-CM | POA: Diagnosis not present

## 2020-06-05 DIAGNOSIS — N281 Cyst of kidney, acquired: Secondary | ICD-10-CM | POA: Diagnosis not present

## 2020-06-05 DIAGNOSIS — Z885 Allergy status to narcotic agent status: Secondary | ICD-10-CM | POA: Diagnosis not present

## 2020-06-05 DIAGNOSIS — K5792 Diverticulitis of intestine, part unspecified, without perforation or abscess without bleeding: Secondary | ICD-10-CM | POA: Diagnosis not present

## 2020-06-05 DIAGNOSIS — K6389 Other specified diseases of intestine: Secondary | ICD-10-CM | POA: Diagnosis not present

## 2020-06-05 DIAGNOSIS — K5909 Other constipation: Secondary | ICD-10-CM | POA: Diagnosis not present

## 2020-06-05 DIAGNOSIS — R197 Diarrhea, unspecified: Secondary | ICD-10-CM | POA: Diagnosis not present

## 2020-06-05 DIAGNOSIS — R1031 Right lower quadrant pain: Secondary | ICD-10-CM | POA: Diagnosis not present

## 2020-06-05 DIAGNOSIS — R10819 Abdominal tenderness, unspecified site: Secondary | ICD-10-CM | POA: Diagnosis not present

## 2020-06-05 DIAGNOSIS — K5790 Diverticulosis of intestine, part unspecified, without perforation or abscess without bleeding: Secondary | ICD-10-CM | POA: Diagnosis not present

## 2020-06-05 DIAGNOSIS — R109 Unspecified abdominal pain: Secondary | ICD-10-CM | POA: Diagnosis not present

## 2020-06-08 DIAGNOSIS — Z6821 Body mass index (BMI) 21.0-21.9, adult: Secondary | ICD-10-CM | POA: Diagnosis not present

## 2020-06-08 DIAGNOSIS — K5712 Diverticulitis of small intestine without perforation or abscess without bleeding: Secondary | ICD-10-CM | POA: Diagnosis not present

## 2020-06-12 DIAGNOSIS — J449 Chronic obstructive pulmonary disease, unspecified: Secondary | ICD-10-CM | POA: Diagnosis not present

## 2020-06-24 DIAGNOSIS — Z9049 Acquired absence of other specified parts of digestive tract: Secondary | ICD-10-CM | POA: Diagnosis not present

## 2020-06-24 DIAGNOSIS — K529 Noninfective gastroenteritis and colitis, unspecified: Secondary | ICD-10-CM | POA: Diagnosis not present

## 2020-06-24 DIAGNOSIS — N170 Acute kidney failure with tubular necrosis: Secondary | ICD-10-CM | POA: Diagnosis not present

## 2020-06-24 DIAGNOSIS — I700 Atherosclerosis of aorta: Secondary | ICD-10-CM | POA: Diagnosis not present

## 2020-06-24 DIAGNOSIS — K6389 Other specified diseases of intestine: Secondary | ICD-10-CM | POA: Diagnosis not present

## 2020-06-24 DIAGNOSIS — Z20822 Contact with and (suspected) exposure to covid-19: Secondary | ICD-10-CM | POA: Diagnosis not present

## 2020-06-24 DIAGNOSIS — R1032 Left lower quadrant pain: Secondary | ICD-10-CM | POA: Diagnosis not present

## 2020-06-25 DIAGNOSIS — I130 Hypertensive heart and chronic kidney disease with heart failure and stage 1 through stage 4 chronic kidney disease, or unspecified chronic kidney disease: Secondary | ICD-10-CM | POA: Diagnosis not present

## 2020-06-25 DIAGNOSIS — I959 Hypotension, unspecified: Secondary | ICD-10-CM | POA: Diagnosis not present

## 2020-06-25 DIAGNOSIS — Z9049 Acquired absence of other specified parts of digestive tract: Secondary | ICD-10-CM | POA: Diagnosis not present

## 2020-06-25 DIAGNOSIS — K6389 Other specified diseases of intestine: Secondary | ICD-10-CM | POA: Diagnosis not present

## 2020-06-25 DIAGNOSIS — Z7901 Long term (current) use of anticoagulants: Secondary | ICD-10-CM | POA: Diagnosis not present

## 2020-06-25 DIAGNOSIS — I272 Pulmonary hypertension, unspecified: Secondary | ICD-10-CM | POA: Diagnosis not present

## 2020-06-25 DIAGNOSIS — R1032 Left lower quadrant pain: Secondary | ICD-10-CM | POA: Diagnosis not present

## 2020-06-25 DIAGNOSIS — K651 Peritoneal abscess: Secondary | ICD-10-CM | POA: Diagnosis not present

## 2020-06-25 DIAGNOSIS — I509 Heart failure, unspecified: Secondary | ICD-10-CM | POA: Diagnosis not present

## 2020-06-25 DIAGNOSIS — K529 Noninfective gastroenteritis and colitis, unspecified: Secondary | ICD-10-CM | POA: Diagnosis not present

## 2020-06-25 DIAGNOSIS — B370 Candidal stomatitis: Secondary | ICD-10-CM | POA: Diagnosis not present

## 2020-06-25 DIAGNOSIS — Z20822 Contact with and (suspected) exposure to covid-19: Secondary | ICD-10-CM | POA: Diagnosis not present

## 2020-06-25 DIAGNOSIS — K630 Abscess of intestine: Secondary | ICD-10-CM | POA: Diagnosis not present

## 2020-06-25 DIAGNOSIS — I700 Atherosclerosis of aorta: Secondary | ICD-10-CM | POA: Diagnosis not present

## 2020-06-25 DIAGNOSIS — K5289 Other specified noninfective gastroenteritis and colitis: Secondary | ICD-10-CM | POA: Diagnosis not present

## 2020-06-25 DIAGNOSIS — R748 Abnormal levels of other serum enzymes: Secondary | ICD-10-CM | POA: Diagnosis not present

## 2020-06-25 DIAGNOSIS — N281 Cyst of kidney, acquired: Secondary | ICD-10-CM | POA: Diagnosis not present

## 2020-06-25 DIAGNOSIS — N179 Acute kidney failure, unspecified: Secondary | ICD-10-CM | POA: Diagnosis not present

## 2020-06-25 DIAGNOSIS — R69 Illness, unspecified: Secondary | ICD-10-CM | POA: Diagnosis not present

## 2020-06-25 DIAGNOSIS — I4891 Unspecified atrial fibrillation: Secondary | ICD-10-CM | POA: Diagnosis not present

## 2020-06-25 DIAGNOSIS — Z792 Long term (current) use of antibiotics: Secondary | ICD-10-CM | POA: Diagnosis not present

## 2020-06-25 DIAGNOSIS — R0600 Dyspnea, unspecified: Secondary | ICD-10-CM | POA: Diagnosis not present

## 2020-06-25 DIAGNOSIS — N170 Acute kidney failure with tubular necrosis: Secondary | ICD-10-CM | POA: Diagnosis not present

## 2020-06-25 DIAGNOSIS — K921 Melena: Secondary | ICD-10-CM | POA: Diagnosis not present

## 2020-06-27 DIAGNOSIS — K529 Noninfective gastroenteritis and colitis, unspecified: Secondary | ICD-10-CM | POA: Diagnosis not present

## 2020-06-27 DIAGNOSIS — N179 Acute kidney failure, unspecified: Secondary | ICD-10-CM | POA: Diagnosis not present

## 2020-06-27 DIAGNOSIS — Z7901 Long term (current) use of anticoagulants: Secondary | ICD-10-CM | POA: Diagnosis not present

## 2020-06-27 DIAGNOSIS — I959 Hypotension, unspecified: Secondary | ICD-10-CM | POA: Diagnosis not present

## 2020-06-27 DIAGNOSIS — B370 Candidal stomatitis: Secondary | ICD-10-CM | POA: Diagnosis not present

## 2020-06-28 DIAGNOSIS — K529 Noninfective gastroenteritis and colitis, unspecified: Secondary | ICD-10-CM | POA: Diagnosis not present

## 2020-06-28 DIAGNOSIS — K6389 Other specified diseases of intestine: Secondary | ICD-10-CM | POA: Diagnosis not present

## 2020-06-28 DIAGNOSIS — K651 Peritoneal abscess: Secondary | ICD-10-CM | POA: Diagnosis not present

## 2020-06-28 DIAGNOSIS — N281 Cyst of kidney, acquired: Secondary | ICD-10-CM | POA: Diagnosis not present

## 2020-06-29 DIAGNOSIS — R0600 Dyspnea, unspecified: Secondary | ICD-10-CM | POA: Diagnosis not present

## 2020-06-29 DIAGNOSIS — I272 Pulmonary hypertension, unspecified: Secondary | ICD-10-CM | POA: Diagnosis not present

## 2020-07-01 DIAGNOSIS — B379 Candidiasis, unspecified: Secondary | ICD-10-CM | POA: Diagnosis not present

## 2020-07-01 DIAGNOSIS — Z8719 Personal history of other diseases of the digestive system: Secondary | ICD-10-CM | POA: Diagnosis not present

## 2020-07-01 DIAGNOSIS — I959 Hypotension, unspecified: Secondary | ICD-10-CM | POA: Diagnosis not present

## 2020-07-01 DIAGNOSIS — Z8709 Personal history of other diseases of the respiratory system: Secondary | ICD-10-CM | POA: Diagnosis not present

## 2020-07-01 DIAGNOSIS — N189 Chronic kidney disease, unspecified: Secondary | ICD-10-CM | POA: Diagnosis not present

## 2020-07-01 DIAGNOSIS — N183 Chronic kidney disease, stage 3 unspecified: Secondary | ICD-10-CM | POA: Insufficient documentation

## 2020-07-01 DIAGNOSIS — K529 Noninfective gastroenteritis and colitis, unspecified: Secondary | ICD-10-CM | POA: Diagnosis not present

## 2020-07-01 DIAGNOSIS — Z8739 Personal history of other diseases of the musculoskeletal system and connective tissue: Secondary | ICD-10-CM | POA: Diagnosis not present

## 2020-07-01 DIAGNOSIS — R748 Abnormal levels of other serum enzymes: Secondary | ICD-10-CM | POA: Diagnosis not present

## 2020-07-01 DIAGNOSIS — Z8679 Personal history of other diseases of the circulatory system: Secondary | ICD-10-CM | POA: Diagnosis not present

## 2020-07-01 DIAGNOSIS — N179 Acute kidney failure, unspecified: Secondary | ICD-10-CM | POA: Diagnosis not present

## 2020-07-02 DIAGNOSIS — N183 Chronic kidney disease, stage 3 unspecified: Secondary | ICD-10-CM | POA: Diagnosis not present

## 2020-07-02 DIAGNOSIS — J9621 Acute and chronic respiratory failure with hypoxia: Secondary | ICD-10-CM | POA: Diagnosis not present

## 2020-07-02 DIAGNOSIS — J690 Pneumonitis due to inhalation of food and vomit: Secondary | ICD-10-CM | POA: Diagnosis not present

## 2020-07-02 DIAGNOSIS — R69 Illness, unspecified: Secondary | ICD-10-CM | POA: Diagnosis not present

## 2020-07-02 DIAGNOSIS — K52839 Microscopic colitis, unspecified: Secondary | ICD-10-CM | POA: Diagnosis not present

## 2020-07-02 DIAGNOSIS — Z452 Encounter for adjustment and management of vascular access device: Secondary | ICD-10-CM | POA: Diagnosis not present

## 2020-07-02 DIAGNOSIS — K529 Noninfective gastroenteritis and colitis, unspecified: Secondary | ICD-10-CM | POA: Diagnosis not present

## 2020-07-02 DIAGNOSIS — Z5181 Encounter for therapeutic drug level monitoring: Secondary | ICD-10-CM | POA: Diagnosis not present

## 2020-07-02 DIAGNOSIS — J449 Chronic obstructive pulmonary disease, unspecified: Secondary | ICD-10-CM | POA: Diagnosis not present

## 2020-07-02 DIAGNOSIS — J441 Chronic obstructive pulmonary disease with (acute) exacerbation: Secondary | ICD-10-CM | POA: Diagnosis not present

## 2020-07-02 DIAGNOSIS — I509 Heart failure, unspecified: Secondary | ICD-10-CM | POA: Diagnosis not present

## 2020-07-02 DIAGNOSIS — R001 Bradycardia, unspecified: Secondary | ICD-10-CM | POA: Diagnosis not present

## 2020-07-02 DIAGNOSIS — J9622 Acute and chronic respiratory failure with hypercapnia: Secondary | ICD-10-CM | POA: Diagnosis not present

## 2020-07-02 DIAGNOSIS — Z792 Long term (current) use of antibiotics: Secondary | ICD-10-CM | POA: Diagnosis not present

## 2020-07-02 DIAGNOSIS — I130 Hypertensive heart and chronic kidney disease with heart failure and stage 1 through stage 4 chronic kidney disease, or unspecified chronic kidney disease: Secondary | ICD-10-CM | POA: Diagnosis not present

## 2020-07-03 ENCOUNTER — Inpatient Hospital Stay (HOSPITAL_COMMUNITY)
Admission: EM | Admit: 2020-07-03 | Discharge: 2020-07-06 | DRG: 190 | Disposition: A | Discharge status: Home Health | Payer: Medicare HMO | Attending: Family Medicine | Admitting: Family Medicine

## 2020-07-03 ENCOUNTER — Other Ambulatory Visit: Payer: Self-pay

## 2020-07-03 ENCOUNTER — Emergency Department (HOSPITAL_COMMUNITY): Payer: Medicare HMO

## 2020-07-03 ENCOUNTER — Encounter (HOSPITAL_COMMUNITY): Payer: Self-pay

## 2020-07-03 DIAGNOSIS — Z9049 Acquired absence of other specified parts of digestive tract: Secondary | ICD-10-CM

## 2020-07-03 DIAGNOSIS — J90 Pleural effusion, not elsewhere classified: Secondary | ICD-10-CM

## 2020-07-03 DIAGNOSIS — J690 Pneumonitis due to inhalation of food and vomit: Secondary | ICD-10-CM | POA: Diagnosis not present

## 2020-07-03 DIAGNOSIS — E559 Vitamin D deficiency, unspecified: Secondary | ICD-10-CM | POA: Diagnosis present

## 2020-07-03 DIAGNOSIS — K219 Gastro-esophageal reflux disease without esophagitis: Secondary | ICD-10-CM | POA: Diagnosis present

## 2020-07-03 DIAGNOSIS — R4182 Altered mental status, unspecified: Secondary | ICD-10-CM | POA: Diagnosis not present

## 2020-07-03 DIAGNOSIS — R531 Weakness: Secondary | ICD-10-CM | POA: Diagnosis not present

## 2020-07-03 DIAGNOSIS — Z452 Encounter for adjustment and management of vascular access device: Secondary | ICD-10-CM | POA: Diagnosis not present

## 2020-07-03 DIAGNOSIS — R0902 Hypoxemia: Secondary | ICD-10-CM | POA: Diagnosis not present

## 2020-07-03 DIAGNOSIS — E785 Hyperlipidemia, unspecified: Secondary | ICD-10-CM | POA: Diagnosis present

## 2020-07-03 DIAGNOSIS — Z20822 Contact with and (suspected) exposure to covid-19: Secondary | ICD-10-CM | POA: Diagnosis present

## 2020-07-03 DIAGNOSIS — E872 Acidosis: Secondary | ICD-10-CM | POA: Diagnosis present

## 2020-07-03 DIAGNOSIS — G9341 Metabolic encephalopathy: Secondary | ICD-10-CM | POA: Diagnosis present

## 2020-07-03 DIAGNOSIS — E1165 Type 2 diabetes mellitus with hyperglycemia: Secondary | ICD-10-CM | POA: Diagnosis present

## 2020-07-03 DIAGNOSIS — I4891 Unspecified atrial fibrillation: Secondary | ICD-10-CM | POA: Diagnosis present

## 2020-07-03 DIAGNOSIS — Y95 Nosocomial condition: Secondary | ICD-10-CM | POA: Diagnosis not present

## 2020-07-03 DIAGNOSIS — I5033 Acute on chronic diastolic (congestive) heart failure: Secondary | ICD-10-CM | POA: Diagnosis not present

## 2020-07-03 DIAGNOSIS — R062 Wheezing: Secondary | ICD-10-CM | POA: Diagnosis not present

## 2020-07-03 DIAGNOSIS — J9620 Acute and chronic respiratory failure, unspecified whether with hypoxia or hypercapnia: Secondary | ICD-10-CM | POA: Diagnosis present

## 2020-07-03 DIAGNOSIS — G4733 Obstructive sleep apnea (adult) (pediatric): Secondary | ICD-10-CM | POA: Diagnosis present

## 2020-07-03 DIAGNOSIS — J440 Chronic obstructive pulmonary disease with acute lower respiratory infection: Secondary | ICD-10-CM | POA: Diagnosis present

## 2020-07-03 DIAGNOSIS — Z743 Need for continuous supervision: Secondary | ICD-10-CM | POA: Diagnosis not present

## 2020-07-03 DIAGNOSIS — I509 Heart failure, unspecified: Secondary | ICD-10-CM | POA: Diagnosis not present

## 2020-07-03 DIAGNOSIS — I110 Hypertensive heart disease with heart failure: Secondary | ICD-10-CM | POA: Diagnosis present

## 2020-07-03 DIAGNOSIS — J9602 Acute respiratory failure with hypercapnia: Secondary | ICD-10-CM | POA: Diagnosis not present

## 2020-07-03 DIAGNOSIS — J189 Pneumonia, unspecified organism: Secondary | ICD-10-CM | POA: Diagnosis not present

## 2020-07-03 DIAGNOSIS — J9811 Atelectasis: Secondary | ICD-10-CM | POA: Diagnosis present

## 2020-07-03 DIAGNOSIS — J9621 Acute and chronic respiratory failure with hypoxia: Secondary | ICD-10-CM | POA: Diagnosis present

## 2020-07-03 DIAGNOSIS — J9622 Acute and chronic respiratory failure with hypercapnia: Secondary | ICD-10-CM | POA: Diagnosis present

## 2020-07-03 DIAGNOSIS — E039 Hypothyroidism, unspecified: Secondary | ICD-10-CM | POA: Diagnosis present

## 2020-07-03 DIAGNOSIS — J441 Chronic obstructive pulmonary disease with (acute) exacerbation: Secondary | ICD-10-CM | POA: Diagnosis not present

## 2020-07-03 DIAGNOSIS — R339 Retention of urine, unspecified: Secondary | ICD-10-CM | POA: Diagnosis present

## 2020-07-03 DIAGNOSIS — J9601 Acute respiratory failure with hypoxia: Secondary | ICD-10-CM | POA: Diagnosis not present

## 2020-07-03 DIAGNOSIS — J9611 Chronic respiratory failure with hypoxia: Secondary | ICD-10-CM | POA: Diagnosis present

## 2020-07-03 DIAGNOSIS — Z7901 Long term (current) use of anticoagulants: Secondary | ICD-10-CM

## 2020-07-03 DIAGNOSIS — R0602 Shortness of breath: Secondary | ICD-10-CM

## 2020-07-03 DIAGNOSIS — R404 Transient alteration of awareness: Secondary | ICD-10-CM | POA: Diagnosis not present

## 2020-07-03 DIAGNOSIS — F17210 Nicotine dependence, cigarettes, uncomplicated: Secondary | ICD-10-CM | POA: Diagnosis present

## 2020-07-03 DIAGNOSIS — Z90710 Acquired absence of both cervix and uterus: Secondary | ICD-10-CM

## 2020-07-03 DIAGNOSIS — Z9119 Patient's noncompliance with other medical treatment and regimen: Secondary | ICD-10-CM

## 2020-07-03 DIAGNOSIS — Z79899 Other long term (current) drug therapy: Secondary | ICD-10-CM

## 2020-07-03 DIAGNOSIS — Z79890 Hormone replacement therapy (postmenopausal): Secondary | ICD-10-CM

## 2020-07-03 DIAGNOSIS — K5720 Diverticulitis of large intestine with perforation and abscess without bleeding: Secondary | ICD-10-CM | POA: Diagnosis present

## 2020-07-03 DIAGNOSIS — Z9981 Dependence on supplemental oxygen: Secondary | ICD-10-CM

## 2020-07-03 DIAGNOSIS — I700 Atherosclerosis of aorta: Secondary | ICD-10-CM | POA: Diagnosis not present

## 2020-07-03 DIAGNOSIS — Z885 Allergy status to narcotic agent status: Secondary | ICD-10-CM

## 2020-07-03 DIAGNOSIS — R918 Other nonspecific abnormal finding of lung field: Secondary | ICD-10-CM | POA: Diagnosis not present

## 2020-07-03 DIAGNOSIS — Z86718 Personal history of other venous thrombosis and embolism: Secondary | ICD-10-CM

## 2020-07-03 DIAGNOSIS — R0609 Other forms of dyspnea: Secondary | ICD-10-CM | POA: Diagnosis not present

## 2020-07-03 LAB — CBC WITH DIFFERENTIAL/PLATELET
Abs Immature Granulocytes: 0.07 10*3/uL (ref 0.00–0.07)
Basophils Absolute: 0.1 10*3/uL (ref 0.0–0.1)
Basophils Relative: 1 %
Eosinophils Absolute: 0.1 10*3/uL (ref 0.0–0.5)
Eosinophils Relative: 1 %
HCT: 33.2 % — ABNORMAL LOW (ref 36.0–46.0)
Hemoglobin: 10.2 g/dL — ABNORMAL LOW (ref 12.0–15.0)
Immature Granulocytes: 1 %
Lymphocytes Relative: 18 %
Lymphs Abs: 1.1 10*3/uL (ref 0.7–4.0)
MCH: 33.7 pg (ref 26.0–34.0)
MCHC: 30.7 g/dL (ref 30.0–36.0)
MCV: 109.6 fL — ABNORMAL HIGH (ref 80.0–100.0)
Monocytes Absolute: 0.6 10*3/uL (ref 0.1–1.0)
Monocytes Relative: 10 %
Neutro Abs: 4.2 10*3/uL (ref 1.7–7.7)
Neutrophils Relative %: 69 %
Platelets: 233 10*3/uL (ref 150–400)
RBC: 3.03 MIL/uL — ABNORMAL LOW (ref 3.87–5.11)
RDW: 13.3 % (ref 11.5–15.5)
WBC: 6.1 10*3/uL (ref 4.0–10.5)
nRBC: 0 % (ref 0.0–0.2)

## 2020-07-03 LAB — COMPREHENSIVE METABOLIC PANEL
ALT: 13 U/L (ref 0–44)
AST: 12 U/L — ABNORMAL LOW (ref 15–41)
Albumin: 3.2 g/dL — ABNORMAL LOW (ref 3.5–5.0)
Alkaline Phosphatase: 56 U/L (ref 38–126)
Anion gap: 6 (ref 5–15)
BUN: 16 mg/dL (ref 8–23)
CO2: 26 mmol/L (ref 22–32)
Calcium: 9.1 mg/dL (ref 8.9–10.3)
Chloride: 106 mmol/L (ref 98–111)
Creatinine, Ser: 1.44 mg/dL — ABNORMAL HIGH (ref 0.44–1.00)
GFR, Estimated: 40 mL/min — ABNORMAL LOW (ref >60)
Glucose, Bld: 125 mg/dL — ABNORMAL HIGH (ref 70–99)
Potassium: 4.1 mmol/L (ref 3.5–5.1)
Sodium: 138 mmol/L (ref 135–145)
Total Bilirubin: 0.5 mg/dL (ref 0.3–1.2)
Total Protein: 6.1 g/dL — ABNORMAL LOW (ref 6.5–8.1)

## 2020-07-03 LAB — BLOOD GAS, ARTERIAL
Acid-base deficit: 1.1 mmol/L (ref 0.0–2.0)
Bicarbonate: 23 mmol/L (ref 20.0–28.0)
FIO2: 35
O2 Saturation: 94.9 %
Patient temperature: 36.9
pCO2 arterial: 56.1 mmHg — ABNORMAL HIGH (ref 32.0–48.0)
pH, Arterial: 7.271 — ABNORMAL LOW (ref 7.350–7.450)
pO2, Arterial: 75.3 mmHg — ABNORMAL LOW (ref 83.0–108.0)

## 2020-07-03 LAB — URINALYSIS, ROUTINE W REFLEX MICROSCOPIC
Bilirubin Urine: NEGATIVE
Glucose, UA: NEGATIVE mg/dL
Hgb urine dipstick: NEGATIVE
Ketones, ur: NEGATIVE mg/dL
Leukocytes,Ua: NEGATIVE
Nitrite: NEGATIVE
Protein, ur: NEGATIVE mg/dL
Specific Gravity, Urine: 1.016 (ref 1.005–1.030)
pH: 5 (ref 5.0–8.0)

## 2020-07-03 LAB — BLOOD GAS, VENOUS
Acid-base deficit: 1.4 mmol/L (ref 0.0–2.0)
Bicarbonate: 21.4 mmol/L (ref 20.0–28.0)
FIO2: 36
O2 Saturation: 72.8 %
Patient temperature: 37
pCO2, Ven: 81.8 mmHg (ref 44.0–60.0)
pH, Ven: 7.14 — CL (ref 7.250–7.430)
pO2, Ven: 45.6 mmHg — ABNORMAL HIGH (ref 32.0–45.0)

## 2020-07-03 LAB — LACTIC ACID, PLASMA
Lactic Acid, Venous: 0.7 mmol/L (ref 0.5–1.9)
Lactic Acid, Venous: 1 mmol/L (ref 0.5–1.9)

## 2020-07-03 LAB — TROPONIN I (HIGH SENSITIVITY)
Troponin I (High Sensitivity): 94 ng/L — ABNORMAL HIGH (ref <18)
Troponin I (High Sensitivity): 92 ng/L — ABNORMAL HIGH (ref <18)

## 2020-07-03 LAB — RESP PANEL BY RT-PCR (FLU A&B, COVID) ARPGX2
Influenza A by PCR: NEGATIVE
Influenza B by PCR: NEGATIVE
SARS Coronavirus 2 by RT PCR: NEGATIVE

## 2020-07-03 LAB — BRAIN NATRIURETIC PEPTIDE: B Natriuretic Peptide: 453 pg/mL — ABNORMAL HIGH (ref 0.0–100.0)

## 2020-07-03 LAB — MRSA PCR SCREENING: MRSA by PCR: NEGATIVE

## 2020-07-03 LAB — GLUCOSE, CAPILLARY: Glucose-Capillary: 97 mg/dL (ref 70–99)

## 2020-07-03 MED ORDER — ATORVASTATIN CALCIUM 20 MG PO TABS
20.0000 mg | ORAL_TABLET | Freq: Every day | ORAL | Status: DC
  Administered 2020-07-05 – 2020-07-06 (×2): 20 mg via ORAL
  Filled 2020-07-03 (×2): qty 1

## 2020-07-03 MED ORDER — PANTOPRAZOLE SODIUM 40 MG IV SOLR
40.0000 mg | INTRAVENOUS | Status: DC
  Administered 2020-07-03: 40 mg via INTRAVENOUS
  Filled 2020-07-03: qty 40

## 2020-07-03 MED ORDER — ALBUTEROL SULFATE (2.5 MG/3ML) 0.083% IN NEBU: 2.5000 mg | INHALATION_SOLUTION | RESPIRATORY_TRACT | Status: DC | PRN

## 2020-07-03 MED ORDER — FUROSEMIDE 10 MG/ML IJ SOLN
40.0000 mg | Freq: Every day | INTRAMUSCULAR | Status: DC
  Administered 2020-07-04 – 2020-07-05 (×2): 40 mg via INTRAVENOUS
  Filled 2020-07-03 (×2): qty 4

## 2020-07-03 MED ORDER — VANCOMYCIN HCL 1250 MG/250ML IV SOLN
1250.0000 mg | Freq: Once | INTRAVENOUS | Status: AC
  Administered 2020-07-03: 1250 mg via INTRAVENOUS
  Filled 2020-07-03: qty 250

## 2020-07-03 MED ORDER — APIXABAN 5 MG PO TABS
5.0000 mg | ORAL_TABLET | Freq: Two times a day (BID) | ORAL | Status: DC
  Administered 2020-07-04 – 2020-07-06 (×5): 5 mg via ORAL
  Filled 2020-07-03 (×5): qty 1

## 2020-07-03 MED ORDER — SODIUM CHLORIDE 0.9 % IV SOLN
2.0000 g | Freq: Once | INTRAVENOUS | Status: AC
  Administered 2020-07-03: 2 g via INTRAVENOUS
  Filled 2020-07-03: qty 2

## 2020-07-03 MED ORDER — ERTAPENEM IV (FOR PTA / DISCHARGE USE ONLY): 1.0000 g | INTRAVENOUS | Status: DC

## 2020-07-03 MED ORDER — VANCOMYCIN HCL 500 MG/100ML IV SOLN
500.0000 mg | INTRAVENOUS | Status: DC
  Filled 2020-07-03 (×2): qty 100

## 2020-07-03 MED ORDER — SODIUM CHLORIDE 0.9 % IV SOLN
1.0000 g | INTRAVENOUS | Status: DC
  Administered 2020-07-03 – 2020-07-05 (×3): 1000 mg via INTRAVENOUS
  Filled 2020-07-03 (×6): qty 1

## 2020-07-03 MED ORDER — INSULIN ASPART 100 UNIT/ML IJ SOLN
0.0000 [IU] | INTRAMUSCULAR | Status: DC
  Administered 2020-07-04: 2 [IU] via SUBCUTANEOUS
  Administered 2020-07-04 – 2020-07-05 (×2): 3 [IU] via SUBCUTANEOUS
  Administered 2020-07-05: 2 [IU] via SUBCUTANEOUS
  Administered 2020-07-05: 3 [IU] via SUBCUTANEOUS
  Administered 2020-07-05: 2 [IU] via SUBCUTANEOUS

## 2020-07-03 MED ORDER — METHYLPREDNISOLONE SODIUM SUCC 125 MG IJ SOLR
60.0000 mg | Freq: Two times a day (BID) | INTRAMUSCULAR | Status: AC
  Administered 2020-07-04 (×2): 60 mg via INTRAVENOUS
  Filled 2020-07-03 (×2): qty 2

## 2020-07-03 MED ORDER — PANTOPRAZOLE SODIUM 40 MG PO TBEC
40.0000 mg | DELAYED_RELEASE_TABLET | Freq: Every day | ORAL | Status: DC
  Administered 2020-07-05 – 2020-07-06 (×2): 40 mg via ORAL
  Filled 2020-07-03 (×2): qty 1

## 2020-07-03 MED ORDER — FUROSEMIDE 10 MG/ML IJ SOLN
40.0000 mg | Freq: Once | INTRAMUSCULAR | Status: AC
  Administered 2020-07-03: 40 mg via INTRAVENOUS
  Filled 2020-07-03: qty 4

## 2020-07-03 MED ORDER — IPRATROPIUM-ALBUTEROL 0.5-2.5 (3) MG/3ML IN SOLN
3.0000 mL | Freq: Four times a day (QID) | RESPIRATORY_TRACT | Status: DC
  Administered 2020-07-03 – 2020-07-04 (×5): 3 mL via RESPIRATORY_TRACT
  Filled 2020-07-03 (×5): qty 3

## 2020-07-03 MED ORDER — ACETAZOLAMIDE 250 MG PO TABS
250.0000 mg | ORAL_TABLET | Freq: Every day | ORAL | Status: DC
  Administered 2020-07-05: 250 mg via ORAL
  Filled 2020-07-03 (×5): qty 1

## 2020-07-03 MED ORDER — METHYLPREDNISOLONE SODIUM SUCC 40 MG IJ SOLR
40.0000 mg | Freq: Three times a day (TID) | INTRAMUSCULAR | Status: DC
  Administered 2020-07-03: 40 mg via INTRAVENOUS
  Filled 2020-07-03: qty 1

## 2020-07-03 MED ORDER — IOHEXOL 350 MG/ML SOLN
60.0000 mL | Freq: Once | INTRAVENOUS | Status: AC | PRN
  Administered 2020-07-03: 60 mL via INTRAVENOUS

## 2020-07-03 MED ORDER — PREDNISONE 20 MG PO TABS
40.0000 mg | ORAL_TABLET | Freq: Every day | ORAL | Status: DC
  Administered 2020-07-05 – 2020-07-06 (×2): 40 mg via ORAL
  Filled 2020-07-03 (×2): qty 2

## 2020-07-03 MED ORDER — LEVOTHYROXINE SODIUM 50 MCG PO TABS
50.0000 ug | ORAL_TABLET | Freq: Every day | ORAL | Status: DC
  Administered 2020-07-04 – 2020-07-06 (×3): 50 ug via ORAL
  Filled 2020-07-03 (×2): qty 2
  Filled 2020-07-03: qty 1

## 2020-07-03 MED ORDER — ALBUTEROL SULFATE HFA 108 (90 BASE) MCG/ACT IN AERS
4.0000 | INHALATION_SPRAY | Freq: Once | RESPIRATORY_TRACT | Status: AC
  Administered 2020-07-03: 4 via RESPIRATORY_TRACT
  Filled 2020-07-03: qty 6.7

## 2020-07-03 NOTE — Consult Note (Signed)
[facility]   Patient name: Tara [name] Admit date: 07/03/2020  DOB: 02/22/1951 LOS: 0  MRN: [id number] Consult date: 07/03/2020  Referring provider: Dr. Melina Copa, ER CC: Short of breath    History:  70 yo female smoker presented to ER dyspnea, hypoxia, and wheezing.  Had recent hospitalization at [facility] for diverticulitis.  She was started on antibiotics, steroids, and supplemental oxygen.  She continued to have difficulty with her breathing and had respiratory acidosis.  She was started on Bipap in the ER.  She was discharged from [facility] on 4/27.  Plan was for home ABx with PICC line and f/u with GI for colonoscopy as outpt.  Her daughter states she was doing okay until earlier this morning.  When she went to check on her she was found to be very sleep.  Her SpO2 at home was in the 50's.  She hasn't been passing urine for the past day.  Daughter states that Ms. Vierling hasn't been using any pain medications or sedatives.  Past medical history:  COPD, DM, HTN, A fib on eliquis, CHF, Hypothyroidism, HLD, Vit D deficiency  Significant events:  4/29 Admit  Studies:   PFT 06/03/19 >> FEV1 1.15 (55%), FEV1% 59, TLC 6.37 (138%)  CT abd/pelvis 06/28/20 >> moderate Rt and small Lt pleural effusion, distended stomach, marked thickening of sigmoid colon with intramural abscess formation along lateral wall of sigmoid colon  Echo 06/29/20 >> EF 60 to 65%, mild LA dilation, PASP 36 mmHg  CT angio chest 07/03/20 >> bilateral pleural effusions Rt > Lt, consolidation in lower lobes b/l, small apical bullae, fatty liver, anasarca  Micro:  Blood 4/29 >> COVID/flu 4/29 >> negative MRSA 4/29 >>   Lines:  Rt PICC line >>    Antibiotics:  Vancomycin 4/29 >> Cefepime 4/29 >>   Consults:      Interim history:    Vital signs:  BP 110/67 (BP Location: Left Arm)   Pulse 83   Temp 98.4 F (36.9 C) (Oral)   Resp 16   Ht '5\' 3"'$  (1.6  m)   Wt 53.5 kg   SpO2 96%   BMI 20.90 kg/m   Intake/output:  No intake/output data recorded.   Physical exam:   General - somnolent Eyes - pupils reactive ENT - no sinus tenderness, no stridor, dry mucosa Cardiac - irregular Chest - decreased breath sounds b/l, poor air movement, faint b/l wheezing Abdomen - soft, non tender, decreased bowel sounds, mild tenderness LLQ Extremities - 1+ non pitting edema Skin - no rashes Neuro - opens eyes with stimulation and able to mumble her name, moves extremities with stimulation, quickly falls back to sleep  Best practice:   DVT - eliquis as outpt SUP - protonix Nutrition - NPO Mobility - bed rest   Assessment/plan:   Acute hypoxic, hypercapnic respiratory failure. - likely combination of COPD exacerbation with continued tobacco abuse, aspiration pneumonitis, and Rt > Lt pleural effusions - scheduled BDs - solumedrol 40 mg q8h - Abx per hospitalist team - adjust oxygen to keep SpO2 90 to 95% - Bipap as needed - monitor need for intubation in ICU - f/u CXR, ABG - lasix 40 mg IV x one; monitor blood pressure  Acute metabolic encephalopathy from hypercapnia. - monitor mental status  Diverticulitis with concern for sigmoid colon abscess. - continue ABx - defer further management to hospitalist team  Elevated troponin. - likely from demand ischemia  Moderate protein calorie malnutrition. -  NPO for now  Anemia of critical illness. - f/u CBC - transfuse for Hb < 7 or significant bleeding  Steroid induced hyperglycemia. - SSI  Resolved hospital problems:    Goals of care/Family discussions:  Code status: full code  Updated pt's daughter at bedside in ER.  Labs:   CMP Latest Ref Rng & Units 07/03/2020 08/09/2007  Glucose 70 - 99 mg/dL 125(H) -  BUN 8 - 23 mg/dL 16 -  Creatinine 0.44 - 1.00 mg/dL 1.44(H) 1.13  Sodium 135 - 145 mmol/L 138 -  Potassium 3.5 - 5.1 mmol/L 4.1 -  Chloride 98 - 111 mmol/L 106 -  CO2 22 -  32 mmol/L 26 -  Calcium 8.9 - 10.3 mg/dL 9.1 -  Total Protein 6.5 - 8.1 g/dL 6.1(L) -  Total Bilirubin 0.3 - 1.2 mg/dL 0.5 -  Alkaline Phos 38 - 126 U/L 56 -  AST 15 - 41 U/L 12(L) -  ALT 0 - 44 U/L 13 -    CBC Latest Ref Rng & Units 07/03/2020  WBC 4.0 - 10.5 K/uL 6.1  Hemoglobin 12.0 - 15.0 g/dL 10.2(L)  Hematocrit 36.0 - 46.0 % 33.2(L)  Platelets 150 - 400 K/uL 233    ABG    Component Value Date/Time   HCO3 21.4 07/03/2020 1440   ACIDBASEDEF 1.4 07/03/2020 1440   O2SAT 72.8 07/03/2020 1440    CBG (last 3)  No results for input(s): GLUCAP in the last 72 hours.   Past surgical history:  She  has a past surgical history that includes Back surgery; Abdominal hysterectomy; Cholecystectomy; and Appendectomy.  Social history:  She  reports that she has been smoking cigarettes. She has been smoking about 0.50 packs per day. She has never used smokeless tobacco. She reports previous alcohol use. She reports that she does not use drugs.   Review of systems:  Unable to obtain.  Family history:  Unable to obtain.    Medications:   No current facility-administered medications on file prior to encounter.   Current Outpatient Medications on File Prior to Encounter  Medication Sig  . acetaZOLAMIDE (DIAMOX) 250 MG tablet Take 250 mg by mouth daily.  Marland Kitchen atorvastatin (LIPITOR) 20 MG tablet Take 20 mg by mouth daily.  Marland Kitchen diltiazem (CARDIZEM SR) 120 MG 12 hr capsule Take 120 mg by mouth daily.  Marland Kitchen ELIQUIS 5 MG TABS tablet Take 1 tablet by mouth 2 (two) times daily.  . ertapenem (INVANZ) IVPB Inject 1 g into the vein daily.  Arna Medici 50 MCG tablet Take 50 mcg by mouth daily.  Marland Kitchen levalbuterol (XOPENEX) 0.63 MG/3ML nebulizer solution Take 0.63 mg by nebulization every 4 (four) hours as needed for wheezing or shortness of breath.  Marland Kitchen LORazepam (ATIVAN) 0.5 MG tablet Take 0.5 mg by mouth 2 (two) times daily.  . Omega-3 Fatty Acids (FISH OIL) 1000 MG CAPS Take 1 capsule by mouth 3 (three)  times daily.  . pantoprazole (PROTONIX) 40 MG tablet Take 1 tablet by mouth daily.  Marland Kitchen tiZANidine (ZANAFLEX) 4 MG tablet Take 4 mg by mouth at bedtime.     Critical care time: 49 minutes  Chesley Mires, MD [place] Pager - [phone] 07/03/2020, 4:41 PM

## 2020-07-03 NOTE — ED Notes (Signed)
Pt satting 100% on 4L, decreased O2 to 2L

## 2020-07-03 NOTE — H&P (Signed)
TRH H&P   Patient Demographics:    Tara [name], is a 70 y.o. female  MRN: [id number]   DOB - [birth year]  Admit Date - 07/03/2020  Outpatient Primary MD for the patient is Neale Burly, MD  Referring MD/NP/PA: PA Patel    Patient coming from: Home  Chief Complaint  Patient presents with  . Shortness of Breath      HPI:    Tara [name]  is a 70 y.o. female, with past medical history of CHF, COPD, on 2 L nasal cannula, diabetes, A. fib on Eliquis, hypertension, patient presents to ED secondary to shortness of breath, she was recently discharged on Wednesday from [facility], for colitis/diverticulitis, with possible microabscesses, to finish total of 2 weeks of Invanz at home, patient was noted to be hypoxic 58% on 2 L at home per EMS, she was 80% on 2 L in ED, patient is somnolent, confused, cannot provide significant history, it was obtained from daughter at bedside, reports mother has been weak, frail, with poor appetite since discharge, and today he is less interactive and responsive which prompted her to call ED. - in ED her ABG  with pH of 7.1, creatinine of 1.4, BNP of 453, CTA chest with no evidence of PE, but significant for pleural effusion and pneumonia, triage hospitalist consulted to admit    Review of systems:    Able to obtain appropriate review of system given she is somnolent   With Past History of the following :    Past Medical History:  Diagnosis Date  . CHF (congestive heart failure) ([place])   . COPD (chronic obstructive pulmonary disease) ([place])   . Diabetes mellitus without complication ([place])   . Hypertension       Past Surgical History:  Procedure Laterality Date  . ABDOMINAL HYSTERECTOMY    . APPENDECTOMY    . BACK SURGERY    . CHOLECYSTECTOMY        Social History:     Social History   Tobacco Use  . Smoking status:  Current Some Day Smoker    Packs/day: 0.50    Types: Cigarettes  . Smokeless tobacco: Never Used  Substance Use Topics  . Alcohol use: Not Currently       Family History :    History reviewed. No pertinent family history.    Home Medications:   Prior to Admission medications   Medication Sig Start Date End Date Taking? Authorizing Provider  acetaZOLAMIDE (DIAMOX) 250 MG tablet Take 250 mg by mouth daily. 04/15/20  Yes [provider]  atorvastatin (LIPITOR) 20 MG tablet Take 20 mg by mouth daily.   Yes [provider]  diltiazem (CARDIZEM SR) 120 MG 12 hr capsule Take 120 mg by mouth daily.   Yes [provider]  ELIQUIS 5 MG TABS tablet Take 1 tablet by mouth 2 (two) times daily.  04/15/20  Yes [provider]  ertapenem (INVANZ) IVPB Inject 1 g into the vein daily. 07/01/20 07/16/20 Yes [provider]  EUTHYROX 50 MCG tablet Take 50 mcg by mouth daily. 07/01/20  Yes [provider]  levalbuterol Penne Lash) 0.63 MG/3ML nebulizer solution Take 0.63 mg by nebulization every 4 (four) hours as needed for wheezing or shortness of breath.   Yes [provider]  LORazepam (ATIVAN) 0.5 MG tablet Take 0.5 mg by mouth 2 (two) times daily. 05/11/20  Yes [provider]  Omega-3 Fatty Acids (FISH OIL) 1000 MG CAPS Take 1 capsule by mouth 3 (three) times daily.   Yes [provider]  pantoprazole (PROTONIX) 40 MG tablet Take 1 tablet by mouth daily. 04/21/20  Yes [provider]  tiZANidine (ZANAFLEX) 4 MG tablet Take 4 mg by mouth at bedtime.   Yes [provider]     Allergies:     Allergies  Allergen Reactions  . Codeine      Physical Exam:   Vitals  Blood pressure 98/64, pulse 70, temperature 98.4 F (36.9 C), temperature source Oral, resp. rate 19, height '5\' 3"'$  (1.6 m), weight 53.5 kg, SpO2 98 %.   1. General elderly female, laying in bed, no apparent distress  2.  Patient was somnolent,  on BiPAP, able to open her eyes minimally interactive, but confused .  3. No F.N deficits, ALL C.Nerves Intact, no gross focal deficits, Plantars down going.  4. Ears and Eyes appear Normal, Conjunctivae clear, PERRLA.  On BiPAP  5. Supple Neck, No JVD, No cervical lymphadenopathy appriciated, No Carotid Bruits.  6. Symmetrical Chest wall movement, diminished air entry bilaterally with faint wheezing  7. RRR, No Gallops, Rubs or Murmurs, No Parasternal Heave.  +1 edema  8. Positive Bowel Sounds, Abdomen Soft, No tenderness, No organomegaly appriciated,No rebound -guarding or rigidity.  9.  No Cyanosis, Normal Skin Turgor, No Skin Rash or Bruise.  10. Good muscle tone,  joints appear normal , no effusions, Normal ROM.  11. No Palpable Lymph Nodes in Neck or Axillae     Data Review:    CBC Recent Labs  Lab 07/03/20 1215  WBC 6.1  HGB 10.2*  HCT 33.2*  PLT 233  MCV 109.6*  MCH 33.7  MCHC 30.7  RDW 13.3  LYMPHSABS 1.1  MONOABS 0.6  EOSABS 0.1  BASOSABS 0.1   ------------------------------------------------------------------------------------------------------------------  Chemistries  Recent Labs  Lab 07/03/20 1215  NA 138  K 4.1  CL 106  CO2 26  GLUCOSE 125*  BUN 16  CREATININE 1.44*  CALCIUM 9.1  AST 12*  ALT 13  ALKPHOS 56  BILITOT 0.5   ------------------------------------------------------------------------------------------------------------------ estimated creatinine clearance is 30.9 mL/min (A) (by C-G formula based on SCr of 1.44 mg/dL (H)). ------------------------------------------------------------------------------------------------------------------ No results for input(s): TSH, T4TOTAL, T3FREE, THYROIDAB in the last 72 hours.  Invalid input(s): FREET3  Coagulation profile No results for input(s): INR, PROTIME in the last 168  hours. ------------------------------------------------------------------------------------------------------------------- No results for input(s): DDIMER in the last 72 hours. -------------------------------------------------------------------------------------------------------------------  Cardiac Enzymes No results for input(s): CKMB, TROPONINI, MYOGLOBIN in the last 168 hours.  Invalid input(s): CK ------------------------------------------------------------------------------------------------------------------    Component Value Date/Time   BNP 453.0 (H) 07/03/2020 1215     ---------------------------------------------------------------------------------------------------------------  Urinalysis No results found for: COLORURINE, APPEARANCEUR, LABSPEC, PHURINE, GLUCOSEU, HGBUR, BILIRUBINUR, KETONESUR, PROTEINUR, UROBILINOGEN, NITRITE, LEUKOCYTESUR  ----------------------------------------------------------------------------------------------------------------   Imaging Results:    DG Chest 2 View  Result Date: 07/03/2020 CLINICAL DATA:  Shortness  of breath with altered mental status EXAM: CHEST - 2 VIEW COMPARISON:  May 28, 2020 FINDINGS: There are pleural effusions bilaterally with airspace opacity in the lung bases. Heart is mildly prominent with pulmonary vascularity normal. No adenopathy. There is degenerative change in each shoulder. IMPRESSION: Bilateral pleural effusions with airspace opacity in the lung bases, concerning for pneumonia. Heart mildly prominent. There may be a degree of underlying congestive heart failure. Electronically Signed   By: Lowella Grip III M.D.   On: 07/03/2020 14:38   CT Angio Chest PE W/Cm &/Or Wo Cm  Result Date: 07/03/2020 CLINICAL DATA:  Decreased oxygen saturation chest radiograph July 03, 2020; chest CT September 24, 2019 EXAM: CT ANGIOGRAPHY CHEST WITH CONTRAST TECHNIQUE: Multidetector CT imaging of the chest was performed using the  standard protocol during bolus administration of intravenous contrast. Multiplanar CT image reconstructions and MIPs were obtained to evaluate the vascular anatomy. CONTRAST:  58m OMNIPAQUE IOHEXOL 350 MG/ML SOLN COMPARISON:  Chest radiograph July 03, 2020; chest CT September 24, 2019 FINDINGS: Cardiovascular: There is no demonstrable pulmonary embolus. There is no thoracic aortic aneurysm or dissection. There are scattered foci of calcification in proximal visualized great vessels. There are foci of aortic atherosclerosis. There are occasional foci of coronary artery calcification. No pericardial effusion or pericardial thickening. Mediastinum/Nodes: Thyroid appears unremarkable. Several subcentimeter mediastinal lymph nodes present which do not meet size criteria for pathologic significance. No esophageal lesions are evident. Lungs/Pleura: There are pleural effusions bilaterally, larger on the right than on the left. There is consolidation in the right lower lobe with a lesser degree of atelectasis and consolidation left lower lobe. No pneumothorax. Small bullae in the apices. Trachea and major bronchial structures appear patent. Upper Abdomen: Gallbladder is absent. There is hepatic steatosis. Foci of upper abdominal aortic atherosclerosis noted. Musculoskeletal: Generalized anasarca noted with soft tissue edema throughout the chest. There is multifocal degenerative change in the thoracic spine. No blastic or lytic bone lesions. Review of the MIP images confirms the above findings. IMPRESSION: 1. No evident pulmonary embolus. No thoracic aortic aneurysm or dissection. There is aortic atherosclerosis as well as foci of great vessel and coronary artery calcification. 2. Pleural effusions bilaterally, larger on the right than the left. Atelectasis with consolidation in both lower lobes concerning for superimposed pneumonia, more severe on the right than the left. Mild bullous disease in the apices. 3. Generalized  anasarca. Question a degree of underlying congestive heart failure given the anasarca with sizable pleural effusions. 4.  Hepatic steatosis. 5.  Gallbladder absent. 6.  No evident adenopathy. Aortic Atherosclerosis (ICD10-I70.0). Electronically Signed   By: WLowella GripIII M.D.   On: 07/03/2020 14:37    My personal review of EKG: Rhythm NSR, Rate  102 /min, QTc 385 , no Acute ST changes   Assessment & Plan:    Active Problems:   Acute respiratory failure with hypoxia and hypercapnia (HCC)   COPD exacerbation (HCC)   Pleural effusion   Acute on chronic respiratory failure (HCC)   Acute on chronic hypoxic/hypercapnic respiratory failure -This is secondary to COPD decerebration, with continued tobacco use, aspiration pneumonitis, with volume overload evident with right> left pleural effusions -She is on 2 L nasal cannula at baseline, hypoxic to 52% on 2 L nasal cannula by EMS, and with significant respiratory acidosis and hypercapnia. -Continue with BiPAP currently for respiratory support.  Continue with broad-spectrum antibiotics for HCAP coverage, will add vancomycin, continue with home Invanz. -Wean oxygen as tolerated. -Pulmonary input  greatly appreciated, monitor closely in ICU for possible need for intubation  COPD Exacerbation -Continue with IV steroids  HCAP -Continue with vancomycin and Invanz  Acute on chronic CHF/bilateral pleural effusion -We will keep on IV diuresis. -Check 2D echo  Acute metabolic encephalopathy from hypercarbia -Continue to monitor on BiPAP  History of acute diverticulitis with concern of sigmoid colon abscess during recent hospitalization at [place] with Invanz(she was discharged on Invanz to finish total of 14 days from 4/27.  Anemia -Continue with statin  Atrial fibrillation -Soft blood pressure, hold diltiazem  -Continue with Eliquis  Hypothyroidism -Continue with Synthroid  GERD -Continue with PPI  Urinary  retention -She had 400 cc post void residual, will insert Foley catheter if she is unable to void   DVT Prophylaxis Eliquis  AM Labs Ordered, also please review Full Orders  Family Communication: Admission, patients condition and plan of care including tests being ordered have been discussed with the patient and daughter at bedside who indicate understanding and agree with the plan and Code Status.  Code Status Full  Likely DC to  Home  Condition GUARDED    Consults called: PCCM    Admission status: inpatient  Time spent in minutes : 60 minutes   Phillips Climes M.D on 07/03/2020 at 9:16 PM   [facility] - Office  [phone]

## 2020-07-03 NOTE — ED Notes (Signed)
Pt in bed, hospitalist at bedside, family at bedside states that pt has not voided since 630 am, pt has 329m per bladder scan, MD notified. RT at bedside placing pt on Bipap

## 2020-07-03 NOTE — ED Triage Notes (Signed)
Pt to er via ems per ems pt was seen at [facility] for diverticulitis, pt has 20 in L ac and picc in R forearm, pt has auditory wheezes, pt satting mid 80 on 2 L, increased pt to 4L pt satting 98% on 4 L,

## 2020-07-03 NOTE — ED Provider Notes (Signed)
[facility] EMERGENCY DEPARTMENT Provider Note   CSN: [id number] Arrival date & time: 07/03/20  1133     History Chief Complaint  Patient presents with  . Shortness of Breath    Tara [name] is a 70 y.o. female with pertinent past medical history of CHF, COPD on 2 L, diabetes, hypertension that presents to the emergency department today for shortness of breath..  She was discharged Wednesday from [facility] for colitis with possible microabscess, patient was on antibiotics for a week, did not end up needing to undergo surgery.  During that hospital visit per chart review patient also had AKI most likely due to significant hypotension which resolved.  Patient is chronically on Eliquis, was stopped during hospital admission due to significant bleeding.  Was resumed yesterday per patient.  Patient states that her daughter called EMS today when she had significant shortness of breath.  Patient normally wears 2 L of oxygen 24/7, daughter states that she was feeling slightly weak this morning and not acting right, states that she was not responding to her and she checked her oxygen and it was 58% on the 2 L.  She denied any significant difficulty breathing or turning blue at that time.  EMS got there and she was satting at80% on 2 L, patient increased to 4 L and was satting at 92%.  States that this happened all of a sudden while at home, daughter called EMS.  States that she was acting fine this morning.  States that she is not having any more significant abdominal pain or bloody stools, has gotten better.  No longer having bloody stools.  States that she felt better when she was discharged yesterday and then all of a sudden this morning she had the acute onset of shortness of breath.  Patient states that she did take her Eliquis this morning.  Denies any pain anywhere, no chest pain.  No fevers.  HPI     Past Medical History:  Diagnosis Date  . CHF (congestive heart failure) ([place])    . COPD (chronic obstructive pulmonary disease) ([place])   . Diabetes mellitus without complication ([place])   . Hypertension     Patient Active Problem List   Diagnosis Date Noted  . Acute on chronic respiratory failure ([place]) 07/03/2020  . Aspiration pneumonia ([place])   . Acute respiratory failure with hypoxia and hypercapnia ([facility])   . COPD exacerbation ([place])   . Pleural effusion     Past Surgical History:  Procedure Laterality Date  . ABDOMINAL HYSTERECTOMY    . APPENDECTOMY    . BACK SURGERY    . CHOLECYSTECTOMY       OB History   No obstetric history on file.     History reviewed. No pertinent family history.  Social History   Tobacco Use  . Smoking status: Current Some Day Smoker    Packs/day: 0.50    Types: Cigarettes  . Smokeless tobacco: Never Used  Vaping Use  . Vaping Use: Never used  Substance Use Topics  . Alcohol use: Not Currently  . Drug use: Never    Home Medications Prior to Admission medications   Medication Sig Start Date End Date Taking? Authorizing Provider  acetaZOLAMIDE (DIAMOX) 250 MG tablet Take 250 mg by mouth daily. 04/15/20  Yes [provider]  atorvastatin (LIPITOR) 20 MG tablet Take 20 mg by mouth daily.   Yes [provider]  diltiazem (CARDIZEM SR) 120 MG 12 hr capsule Take 120 mg by  mouth daily.   Yes [provider]  ELIQUIS 5 MG TABS tablet Take 1 tablet by mouth 2 (two) times daily. 04/15/20  Yes [provider]  ertapenem (INVANZ) IVPB Inject 1 g into the vein daily. 07/01/20 07/16/20 Yes [provider]  EUTHYROX 50 MCG tablet Take 50 mcg by mouth daily. 07/01/20  Yes [provider]  levalbuterol Penne Lash) 0.63 MG/3ML nebulizer solution Take 0.63 mg by nebulization every 4 (four) hours as needed for wheezing or shortness of breath.   Yes [provider]  LORazepam (ATIVAN) 0.5 MG tablet Take 0.5 mg by mouth 2 (two) times daily. 05/11/20  Yes [provider]  Omega-3  Fatty Acids (FISH OIL) 1000 MG CAPS Take 1 capsule by mouth 3 (three) times daily.   Yes [provider]  pantoprazole (PROTONIX) 40 MG tablet Take 1 tablet by mouth daily. 04/21/20  Yes [provider]  tiZANidine (ZANAFLEX) 4 MG tablet Take 4 mg by mouth at bedtime.   Yes [provider]    Allergies    Codeine  Review of Systems   Review of Systems  Constitutional: Negative for chills, diaphoresis, fatigue and fever.  HENT: Negative for congestion, sore throat and trouble swallowing.   Eyes: Negative for pain and visual disturbance.  Respiratory: Positive for shortness of breath and wheezing. Negative for cough.   Cardiovascular: Negative for chest pain, palpitations and leg swelling.  Gastrointestinal: Negative for abdominal distention, abdominal pain, diarrhea, nausea and vomiting.  Genitourinary: Negative for difficulty urinating.  Musculoskeletal: Negative for back pain, neck pain and neck stiffness.  Skin: Negative for pallor.  Neurological: Negative for dizziness, speech difficulty, weakness and headaches.  Psychiatric/Behavioral: Negative for confusion.    Physical Exam Updated Vital Signs BP 117/65   Pulse 83   Temp 98.4 F (36.9 C) (Oral)   Resp 20   Ht '5\' 3"'$  (1.6 m)   Wt 53.5 kg   SpO2 96%   BMI 20.90 kg/m   Physical Exam Constitutional:      General: She is not in acute distress.    Appearance: Normal appearance. She is ill-appearing. She is not toxic-appearing or diaphoretic.     Comments: Patient is chronically ill-appearing.  No audible wheezes heard.  No respiratory distress.  Handling secretions well.   HENT:     Mouth/Throat:     Mouth: Mucous membranes are moist.     Pharynx: Oropharynx is clear.  Eyes:     General: No scleral icterus.    Extraocular Movements: Extraocular movements intact.     Pupils: Pupils are equal, round, and reactive to light.  Cardiovascular:     Rate and Rhythm: Normal rate and regular rhythm.      Pulses: Normal pulses.     Heart sounds: Normal heart sounds.  Pulmonary:     Effort: Tachypnea and accessory muscle usage present. No respiratory distress.     Breath sounds: No stridor. Wheezing present. No rhonchi or rales.     Comments: Patient was satting at 92% on 4 L, did wean oxygen down to 2 L and patient still satting at 92%.  Slight accessory muscle use and wheezes heard throughout all lung fields. Chest:     Chest wall: No tenderness.  Abdominal:     General: Abdomen is flat. There is no distension.     Palpations: Abdomen is soft.     Tenderness: There is no abdominal tenderness. There is no guarding or rebound.  Comments: Soft nontender  Musculoskeletal:        General: No swelling or tenderness. Normal range of motion.     Cervical back: Normal range of motion and neck supple. No rigidity.     Right lower leg: No edema.     Left lower leg: No edema.  Skin:    General: Skin is warm and dry.     Capillary Refill: Capillary refill takes less than 2 seconds.     Coloration: Skin is not pale.  Neurological:     General: No focal deficit present.     Mental Status: She is alert and oriented to person, place, and time.  Psychiatric:        Mood and Affect: Mood normal.        Behavior: Behavior normal.     ED Results / Procedures / Treatments   Labs (all labs ordered are listed, but only abnormal results are displayed) Labs Reviewed  BRAIN NATRIURETIC PEPTIDE - Abnormal; Notable for the following components:      Result Value   B Natriuretic Peptide 453.0 (*)    All other components within normal limits  CBC WITH DIFFERENTIAL/PLATELET - Abnormal; Notable for the following components:   RBC 3.03 (*)    Hemoglobin 10.2 (*)    HCT 33.2 (*)    MCV 109.6 (*)    All other components within normal limits  COMPREHENSIVE METABOLIC PANEL - Abnormal; Notable for the following components:   Glucose, Bld 125 (*)    Creatinine, Ser 1.44 (*)    Total Protein 6.1 (*)     Albumin 3.2 (*)    AST 12 (*)    GFR, Estimated 40 (*)    All other components within normal limits  BLOOD GAS, VENOUS - Abnormal; Notable for the following components:   pH, Ven 7.140 (*)    pCO2, Ven 81.8 (*)    pO2, Ven 45.6 (*)    All other components within normal limits  TROPONIN I (HIGH SENSITIVITY) - Abnormal; Notable for the following components:   Troponin I (High Sensitivity) 94 (*)    All other components within normal limits  TROPONIN I (HIGH SENSITIVITY) - Abnormal; Notable for the following components:   Troponin I (High Sensitivity) 92 (*)    All other components within normal limits  CULTURE, BLOOD (ROUTINE X 2)  CULTURE, BLOOD (ROUTINE X 2)  RESP PANEL BY RT-PCR (FLU A&B, COVID) ARPGX2  MRSA PCR SCREENING  LACTIC ACID, PLASMA  LACTIC ACID, PLASMA  URINALYSIS, ROUTINE W REFLEX MICROSCOPIC  BLOOD GAS, ARTERIAL  COMPREHENSIVE METABOLIC PANEL  CBC  BLOOD GAS, ARTERIAL    EKG EKG Interpretation  Date/Time:  Friday July 03 2020 11:46:39 EDT Ventricular Rate:  102 PR Interval:  143 QRS Duration: 87 QT Interval:  295 QTC Calculation: 385 R Axis:   102 Text Interpretation: Sinus tachycardia Multiple premature complexes, vent & supraven Right axis deviation Borderline low voltage, extremity leads Confirmed by Aletta Edouard [phone]) on 07/03/2020 12:11:08 PM   Radiology DG Chest 2 View  Result Date: 07/03/2020 CLINICAL DATA:  Shortness of breath with altered mental status EXAM: CHEST - 2 VIEW COMPARISON:  May 28, 2020 FINDINGS: There are pleural effusions bilaterally with airspace opacity in the lung bases. Heart is mildly prominent with pulmonary vascularity normal. No adenopathy. There is degenerative change in each shoulder. IMPRESSION: Bilateral pleural effusions with airspace opacity in the lung bases, concerning for pneumonia. Heart mildly prominent. There may be a degree  of underlying congestive heart failure. Electronically Signed   By: Lowella Grip III M.D.   On: 07/03/2020 14:38   CT Angio Chest PE W/Cm &/Or Wo Cm  Result Date: 07/03/2020 CLINICAL DATA:  Decreased oxygen saturation chest radiograph July 03, 2020; chest CT September 24, 2019 EXAM: CT ANGIOGRAPHY CHEST WITH CONTRAST TECHNIQUE: Multidetector CT imaging of the chest was performed using the standard protocol during bolus administration of intravenous contrast. Multiplanar CT image reconstructions and MIPs were obtained to evaluate the vascular anatomy. CONTRAST:  54m OMNIPAQUE IOHEXOL 350 MG/ML SOLN COMPARISON:  Chest radiograph July 03, 2020; chest CT September 24, 2019 FINDINGS: Cardiovascular: There is no demonstrable pulmonary embolus. There is no thoracic aortic aneurysm or dissection. There are scattered foci of calcification in proximal visualized great vessels. There are foci of aortic atherosclerosis. There are occasional foci of coronary artery calcification. No pericardial effusion or pericardial thickening. Mediastinum/Nodes: Thyroid appears unremarkable. Several subcentimeter mediastinal lymph nodes present which do not meet size criteria for pathologic significance. No esophageal lesions are evident. Lungs/Pleura: There are pleural effusions bilaterally, larger on the right than on the left. There is consolidation in the right lower lobe with a lesser degree of atelectasis and consolidation left lower lobe. No pneumothorax. Small bullae in the apices. Trachea and major bronchial structures appear patent. Upper Abdomen: Gallbladder is absent. There is hepatic steatosis. Foci of upper abdominal aortic atherosclerosis noted. Musculoskeletal: Generalized anasarca noted with soft tissue edema throughout the chest. There is multifocal degenerative change in the thoracic spine. No blastic or lytic bone lesions. Review of the MIP images confirms the above findings. IMPRESSION: 1. No evident pulmonary embolus. No thoracic aortic aneurysm or dissection. There is aortic atherosclerosis as  well as foci of great vessel and coronary artery calcification. 2. Pleural effusions bilaterally, larger on the right than the left. Atelectasis with consolidation in both lower lobes concerning for superimposed pneumonia, more severe on the right than the left. Mild bullous disease in the apices. 3. Generalized anasarca. Question a degree of underlying congestive heart failure given the anasarca with sizable pleural effusions. 4.  Hepatic steatosis. 5.  Gallbladder absent. 6.  No evident adenopathy. Aortic Atherosclerosis (ICD10-I70.0). Electronically Signed   By: WLowella GripIII M.D.   On: 07/03/2020 14:37    Procedures .Critical Care Performed by: PAlfredia Client PA-C Authorized by: PAlfredia Client PA-C   Critical care provider statement:    Critical care time (minutes):  45   Critical care was necessary to treat or prevent imminent or life-threatening deterioration of the following conditions:  Respiratory failure   Critical care was time spent personally by me on the following activities:  Discussions with consultants, evaluation of patient's response to treatment, examination of patient, ordering and performing treatments and interventions, ordering and review of laboratory studies, ordering and review of radiographic studies, pulse oximetry, re-evaluation of patient's condition, obtaining history from patient or surrogate and review of old charts     Medications Ordered in ED Medications  vancomycin (VANCOREADY) IVPB 1250 mg/250 mL (1,250 mg Intravenous New Bag/Given 07/03/20 1555)  vancomycin (VANCOREADY) IVPB 500 mg/100 mL (has no administration in time range)  methylPREDNISolone sodium succinate (SOLU-MEDROL) 40 mg/mL injection 40 mg (has no administration in time range)  ipratropium-albuterol (DUONEB) 0.5-2.5 (3) MG/3ML nebulizer solution 3 mL (3 mLs Nebulization Given 07/03/20 1653)  albuterol (PROVENTIL) (2.5 MG/3ML) 0.083% nebulizer solution 2.5 mg (has no administration in time  range)  furosemide (LASIX) injection 40 mg (has no administration in  time range)  insulin aspart (novoLOG) injection 0-15 Units (has no administration in time range)  pantoprazole (PROTONIX) injection 40 mg (has no administration in time range)  albuterol (VENTOLIN HFA) 108 (90 Base) MCG/ACT inhaler 4 puff (4 puffs Inhalation Given 07/03/20 1257)  iohexol (OMNIPAQUE) 350 MG/ML injection 60 mL (60 mLs Intravenous Contrast Given 07/03/20 1354)  ceFEPIme (MAXIPIME) 2 g in sodium chloride 0.9 % 100 mL IVPB (0 g Intravenous Stopped 07/03/20 1554)    ED Course  I have reviewed the triage vital signs and the nursing notes.  Pertinent labs & imaging results that were available during my care of the patient were reviewed by me and considered in my medical decision making (see chart for details).  Clinical Course as of 07/03/20 1722  Fri Jul 03, 2020  1333 Patient presenting from home for increased shortness of breath.  Apparently recently he was seen at [facility] for diverticulitis.  Patient is very drowsy on my exam.  Getting labs EKG and CT angio chest.  Disposition per results of testing. [MB]    Clinical Course User Index [MB] Hayden Rasmussen, MD   MDM Rules/Calculators/A&P                         Tara [name] is a 70 y.o. female with pertinent past medical history of CHF, COPD on 2 L, diabetes, hypertension that presents to the emergency department today for shortness of breath.  Patient is not in significant respiratory stress right now, however is using some accessory muscle use and is slightly tachypneic with wheezes heard throughout.  With acute onset of shortness of breath differential to include PE or COPD exacerbation  Or PNA.  Patient meets SIRS with tachypnea and tachycardia, will order sepsis order set, does not appear septic.  CT angio does not show evidence of PE, does show 2 large bilateral pleural effusions with most likely superimposed pneumonia bilaterally.  With source of  infection and SIRS, will start HCAP coverage, blood cultures drawn. No need for fluids at this time, normal lactic acid and WBC.  ABG back with pH of 7.14 with PCO2 of 81. Pt placed on BIPAP, is awake and alert.  Troponin 94, second troponin 92.  EKG without any major changes.  BNP up at 453.  Call Dr. Halford Chessman, critical care who will consult.  Patient admitted to hospitalist, Dr. Waldron [facility] for shortness of breath, hypercapnia, HCAP.  The patient appears reasonably stabilized for admission considering the current resources, flow, and capabilities available in the ED at this time, and I doubt any other [facility] requiring further screening and/or treatment in the ED prior to admission.  I discussed this case with my attending physician who cosigned this note including patient's presenting symptoms, physical exam, and planned diagnostics and interventions. Attending physician stated agreement with plan or made changes to plan which were implemented.   Attending physician assessed patient at bedside.  Final Clinical Impression(s) / ED Diagnoses Final diagnoses:  SOB (shortness of breath)  HCAP (healthcare-associated pneumonia)    Rx / DC Orders ED Discharge Orders    None       Alfredia Client, PA-C 07/03/20 1724    Hayden Rasmussen, MD 07/03/20 1732

## 2020-07-03 NOTE — ED Notes (Signed)
Date and time results received: 07/03/20 1550   Test: VBG Critical Value: PCO2 81.8 and ph 7.140  Name of Provider Notified: PA Posey Pronto

## 2020-07-03 NOTE — ED Notes (Signed)
Pt voided aprox 250 ml of urine, called ac for assistance in transporting pt to icu

## 2020-07-03 NOTE — Progress Notes (Signed)
Pharmacy Antibiotic Note  Tara [name] is a 70 y.o. female admitted on 07/03/2020 with pneumonia.  Pharmacy has been consulted for Vancomycin dosing.  Plan: Vancomycin 1250 mg IV x 1 dose. Vancomycin 500 mg IV every 24 hours. Cefepime 2000 mg IV x 1 dose. Monitor labs, c/s, and vanco level as indicated.  Height: '5\' 3"'$  (160 cm) Weight: 53.5 kg (118 lb) IBW/kg (Calculated) : 52.4  Temp (24hrs), Avg:98.6 F (37 C), Min:98.5 F (36.9 C), Max:98.6 F (37 C)  Recent Labs  Lab 07/03/20 1215  WBC 6.1  CREATININE 1.44*  LATICACIDVEN 0.7    Estimated Creatinine Clearance: 30.9 mL/min (A) (by C-G formula based on SCr of 1.44 mg/dL (H)).    Allergies  Allergen Reactions  . Codeine     Antimicrobials this admission: Vanco 4/29 >>  Cefepime 4/29   Microbiology results: 4/29 BCx: pending 4/29 MRSA PCR: pending  Thank you for allowing pharmacy to be a part of this patient's care.  Ramond Craver 07/03/2020 2:55 PM

## 2020-07-04 ENCOUNTER — Other Ambulatory Visit: Payer: Self-pay

## 2020-07-04 ENCOUNTER — Inpatient Hospital Stay (HOSPITAL_COMMUNITY): Payer: Medicare HMO

## 2020-07-04 DIAGNOSIS — J189 Pneumonia, unspecified organism: Secondary | ICD-10-CM

## 2020-07-04 DIAGNOSIS — J9621 Acute and chronic respiratory failure with hypoxia: Secondary | ICD-10-CM

## 2020-07-04 DIAGNOSIS — R0609 Other forms of dyspnea: Secondary | ICD-10-CM

## 2020-07-04 DIAGNOSIS — J441 Chronic obstructive pulmonary disease with (acute) exacerbation: Secondary | ICD-10-CM | POA: Diagnosis not present

## 2020-07-04 DIAGNOSIS — J9622 Acute and chronic respiratory failure with hypercapnia: Secondary | ICD-10-CM

## 2020-07-04 DIAGNOSIS — J90 Pleural effusion, not elsewhere classified: Secondary | ICD-10-CM | POA: Diagnosis not present

## 2020-07-04 LAB — COMPREHENSIVE METABOLIC PANEL
ALT: 11 U/L (ref 0–44)
AST: 11 U/L — ABNORMAL LOW (ref 15–41)
Albumin: 2.8 g/dL — ABNORMAL LOW (ref 3.5–5.0)
Alkaline Phosphatase: 48 U/L (ref 38–126)
Anion gap: 10 (ref 5–15)
BUN: 19 mg/dL (ref 8–23)
CO2: 21 mmol/L — ABNORMAL LOW (ref 22–32)
Calcium: 8.9 mg/dL (ref 8.9–10.3)
Chloride: 108 mmol/L (ref 98–111)
Creatinine, Ser: 1.49 mg/dL — ABNORMAL HIGH (ref 0.44–1.00)
GFR, Estimated: 38 mL/min — ABNORMAL LOW (ref >60)
Glucose, Bld: 141 mg/dL — ABNORMAL HIGH (ref 70–99)
Potassium: 3.6 mmol/L (ref 3.5–5.1)
Sodium: 139 mmol/L (ref 135–145)
Total Bilirubin: 0.6 mg/dL (ref 0.3–1.2)
Total Protein: 5.5 g/dL — ABNORMAL LOW (ref 6.5–8.1)

## 2020-07-04 LAB — BLOOD GAS, ARTERIAL
Acid-base deficit: 0.9 mmol/L (ref 0.0–2.0)
Bicarbonate: 23.5 mmol/L (ref 20.0–28.0)
FIO2: 35
O2 Saturation: 96.3 %
Patient temperature: 37
pCO2 arterial: 43.9 mmHg (ref 32.0–48.0)
pH, Arterial: 7.356 (ref 7.350–7.450)
pO2, Arterial: 81 mmHg — ABNORMAL LOW (ref 83.0–108.0)

## 2020-07-04 LAB — CBC
HCT: 28.8 % — ABNORMAL LOW (ref 36.0–46.0)
Hemoglobin: 8.9 g/dL — ABNORMAL LOW (ref 12.0–15.0)
MCH: 33.7 pg (ref 26.0–34.0)
MCHC: 30.9 g/dL (ref 30.0–36.0)
MCV: 109.1 fL — ABNORMAL HIGH (ref 80.0–100.0)
Platelets: 199 10*3/uL (ref 150–400)
RBC: 2.64 MIL/uL — ABNORMAL LOW (ref 3.87–5.11)
RDW: 13.2 % (ref 11.5–15.5)
WBC: 4.9 10*3/uL (ref 4.0–10.5)
nRBC: 0 % (ref 0.0–0.2)

## 2020-07-04 LAB — ECHOCARDIOGRAM COMPLETE
Area-P 1/2: 3.15 cm2
Height: 63 in
S' Lateral: 2.56 cm
Weight: 2261.04 oz

## 2020-07-04 LAB — GLUCOSE, CAPILLARY
Glucose-Capillary: 113 mg/dL — ABNORMAL HIGH (ref 70–99)
Glucose-Capillary: 117 mg/dL — ABNORMAL HIGH (ref 70–99)
Glucose-Capillary: 122 mg/dL — ABNORMAL HIGH (ref 70–99)
Glucose-Capillary: 128 mg/dL — ABNORMAL HIGH (ref 70–99)
Glucose-Capillary: 16 mg/dL — CL (ref 70–99)
Glucose-Capillary: 179 mg/dL — ABNORMAL HIGH (ref 70–99)
Glucose-Capillary: 187 mg/dL — ABNORMAL HIGH (ref 70–99)

## 2020-07-04 LAB — STREP PNEUMONIAE URINARY ANTIGEN: Strep Pneumo Urinary Antigen: NEGATIVE

## 2020-07-04 LAB — HIV ANTIBODY (ROUTINE TESTING W REFLEX): HIV Screen 4th Generation wRfx: NONREACTIVE

## 2020-07-04 MED ORDER — CHLORHEXIDINE GLUCONATE CLOTH 2 % EX PADS
6.0000 | MEDICATED_PAD | Freq: Every day | CUTANEOUS | Status: DC
  Administered 2020-07-04 – 2020-07-06 (×3): 6 via TOPICAL

## 2020-07-04 MED ORDER — VANCOMYCIN HCL 750 MG/150ML IV SOLN
750.0000 mg | INTRAVENOUS | Status: DC
  Administered 2020-07-04: 750 mg via INTRAVENOUS
  Filled 2020-07-04: qty 150

## 2020-07-04 MED ORDER — IPRATROPIUM-ALBUTEROL 0.5-2.5 (3) MG/3ML IN SOLN
3.0000 mL | Freq: Three times a day (TID) | RESPIRATORY_TRACT | Status: DC
  Administered 2020-07-05 – 2020-07-06 (×4): 3 mL via RESPIRATORY_TRACT
  Filled 2020-07-04 (×4): qty 3

## 2020-07-04 NOTE — Progress Notes (Signed)
Pharmacy Antibiotic Note  Tara [name] is a 70 y.o. female admitted on 07/03/2020 with pneumonia.  Pharmacy has been consulted for Vancomycin dosing.  Plan: Vancomycin 1250 mg IV x 1 dose. Vancomycin 750 mg IV every 24 hours. Cefepime 2000 mg IV x 1 dose. Monitor labs, c/s, and vanco level as indicated.  Height: '5\' 3"'$  (160 cm) Weight: 64.1 kg (141 lb 5 oz) IBW/kg (Calculated) : 52.4  Temp (24hrs), Avg:98.5 F (36.9 C), Min:97.8 F (36.6 C), Max:99.4 F (37.4 C)  Recent Labs  Lab 07/03/20 1215 07/03/20 1440 07/04/20 0412  WBC 6.1  --  4.9  CREATININE 1.44*  --  1.49*  LATICACIDVEN 0.7 1.0  --     Estimated Creatinine Clearance: 32.6 mL/min (A) (by C-G formula based on SCr of 1.49 mg/dL (H)).    Allergies  Allergen Reactions  . Codeine     Antimicrobials this admission: Vanco 4/29 >>  Cefepime 4/29 Ertapenem PTA >>   Microbiology results: 4/29 BCx: pending 4/29 MRSA PCR: pending  Thank you for allowing pharmacy to be a part of this patient's care.  Tara [name] 07/04/2020 1:10 PM

## 2020-07-04 NOTE — Progress Notes (Signed)
  Echocardiogram 2D Echocardiogram has been performed.  Matilde Bash 07/04/2020, 10:00 AM

## 2020-07-04 NOTE — Progress Notes (Signed)
PROGRESS NOTE    Tara [name]  [id number] DOB: 1951-02-14 DOA: 07/03/2020 PCP: Neale Burly, MD    Brief Narrative:  70 y/o female with history of COPD on home oxygen at 2L, admitted with acute on chronic hypoxic and hypercapnic respiratory failure.  She had associated metabolic encephalopathy from hypercapnia.  She was started on BiPAP, steroids/bronchodilators for COPD as well as antibiotics for possible pneumonia.  She did have some pleural effusions and was also treated with Lasix.  PCCM following.   Assessment & Plan:   Active Problems:   Acute respiratory failure with hypoxia and hypercapnia (HCC)   COPD exacerbation (HCC)   Pleural effusion   Acute on chronic respiratory failure (HCC)   Acute on chronic hypoxic and hypercapnic respiratory failure -Chronically on 2 L of oxygen -On admission, noted to be hypoxic at 52% on 2 L -ABG showed hypoxia and hypercapnia -Started on BiPAP with improved blood gas -pH is now normalized and PCO2 is improved -Mental status appears to be doing better -We will give a trial off of BiPAP -Appreciate PCCM input -CTA chest negative for pulmonary embolism  COPD exacerbation -Continue on IV steroids, bronchodilators  Acute metabolic encephalopathy secondary to hypercapnia -Mental status has improved with BiPAP treatment  HCAP -Currently on intravenous vancomycin and Invanz -MRSA PCR is negative, will discontinue vancomycin  Acute on chronic congestive heart failure with bilateral pleural effusions -She is on intravenous Lasix -Echocardiogram has been ordered -Monitor intake and output  Atrial fibrillation -Chronically on diltiazem which was held due to soft blood pressures -Resume as blood pressure will allow -Anticoagulated with Eliquis  Hyperlipidemia -Continue statin  Recent admission at [facility] for acute diverticulitis and concern for sigmoid colon abscess -She was discharged on Invanz to complete a total of  14 days (this will be completed on 5/11)  Hypothyroidism -Continue Synthroid   DVT prophylaxis:  apixaban (ELIQUIS) tablet 5 mg  Code Status: Partial code Family Communication: Discussed with daughter at the bedside Disposition Plan: Status is: Inpatient  Remains inpatient appropriate because:Inpatient level of care appropriate due to severity of illness   Dispo: The patient is from: Home              Anticipated d/c is to: TBD, may need placement              Patient currently is not medically stable to d/c.   Difficult to place patient No    Consultants:   PCCM  Procedures:     Antimicrobials:   Vancomycin 4/29 >  Ertapenem 4/29 >   Subjective: She is sleeping, on BiPAP, easily wakes up to voice and is able to answer questions.  Objective: Vitals:   07/04/20 0418 07/04/20 0458 07/04/20 0500 07/04/20 0809  BP:  (!) 107/54 93/78   Pulse:  71 74   Resp:  20 (!) 23   Temp: 97.8 F (36.6 C)  98 F (36.7 C) 98.4 F (36.9 C)  TempSrc: Axillary  Oral Axillary  SpO2:  100% 100%   Weight: 64.1 kg     Height:        Intake/Output Summary (Last 24 hours) at 07/04/2020 1000 Last data filed at 07/03/2020 1938 Gross per 24 hour  Intake 436.06 ml  Output --  Net 436.06 ml   Filed Weights   07/03/20 1152 07/03/20 2226 07/04/20 0418  Weight: 53.5 kg 65.1 kg 64.1 kg    Examination:  General exam: Appears calm and comfortable, currently on BiPAP  Respiratory system: Clear to auscultation. Respiratory effort normal. Cardiovascular system: S1 & S2 heard, RRR. No JVD, murmurs, rubs, gallops or clicks. No pedal edema. Gastrointestinal system: Abdomen is nondistended, soft and nontender. No organomegaly or masses felt. Normal bowel sounds heard. Central nervous system: Alert and oriented. No focal neurological deficits. Extremities: Symmetric 5 x 5 power. Skin: No rashes, lesions or ulcers Psychiatry: Judgement and insight appear normal. Mood & affect appropriate.      Data Reviewed: I have personally reviewed following labs and imaging studies  CBC: Recent Labs  Lab 07/03/20 1215 07/04/20 0412  WBC 6.1 4.9  NEUTROABS 4.2  --   HGB 10.2* 8.9*  HCT 33.2* 28.8*  MCV 109.6* 109.1*  PLT 233 [id number]   Basic Metabolic Panel: Recent Labs  Lab 07/03/20 1215 07/04/20 0412  NA 138 139  K 4.1 3.6  CL 106 108  CO2 26 21*  GLUCOSE 125* 141*  BUN 16 19  CREATININE 1.44* 1.49*  CALCIUM 9.1 8.9   GFR: Estimated Creatinine Clearance: 32.6 mL/min (A) (by C-G formula based on SCr of 1.49 mg/dL (H)). Liver Function Tests: Recent Labs  Lab 07/03/20 1215 07/04/20 0412  AST 12* 11*  ALT 13 11  ALKPHOS 56 48  BILITOT 0.5 0.6  PROT 6.1* 5.5*  ALBUMIN 3.2* 2.8*   No results for input(s): LIPASE, AMYLASE in the last 168 hours. No results for input(s): AMMONIA in the last 168 hours. Coagulation Profile: No results for input(s): INR, PROTIME in the last 168 hours. Cardiac Enzymes: No results for input(s): CKTOTAL, CKMB, CKMBINDEX, TROPONINI in the last 168 hours. BNP (last 3 results) No results for input(s): PROBNP in the last 8760 hours. HbA1C: No results for input(s): HGBA1C in the last 72 hours. CBG: Recent Labs  Lab 07/03/20 2238 07/04/20 0518 07/04/20 0821  GLUCAP 97 128* 117*   Lipid Profile: No results for input(s): CHOL, HDL, LDLCALC, TRIG, CHOLHDL, LDLDIRECT in the last 72 hours. Thyroid Function Tests: No results for input(s): TSH, T4TOTAL, FREET4, T3FREE, THYROIDAB in the last 72 hours. Anemia Panel: No results for input(s): VITAMINB12, FOLATE, FERRITIN, TIBC, IRON, RETICCTPCT in the last 72 hours. Sepsis Labs: Recent Labs  Lab 07/03/20 1215 07/03/20 1440  LATICACIDVEN 0.7 1.0    Recent Results (from the past 240 hour(s))  Culture, blood (routine x 2)     Status: None (Preliminary result)   Collection Time: 07/03/20 12:16 PM   Specimen: BLOOD  Result Value Ref Range Status   Specimen Description BLOOD LEFT WRIST   Final   Special Requests   Final    BOTTLES DRAWN AEROBIC AND ANAEROBIC Blood Culture adequate volume   Culture   Final    NO GROWTH < 24 HOURS Performed at [facility], [address], [place], [place] [address]    Report Status PENDING  Incomplete  Resp Panel by RT-PCR (Flu A&B, Covid) Nasopharyngeal Swab     Status: None   Collection Time: 07/03/20 12:16 PM   Specimen: Nasopharyngeal Swab; Nasopharyngeal(NP) swabs in vial transport medium  Result Value Ref Range Status   SARS Coronavirus 2 by RT PCR NEGATIVE NEGATIVE Final    Comment: (NOTE) SARS-CoV-2 target nucleic acids are NOT DETECTED.  The SARS-CoV-2 RNA is generally detectable in upper respiratory specimens during the acute phase of infection. The lowest concentration of SARS-CoV-2 viral copies this assay can detect is 138 copies/mL. A negative result does not preclude SARS-Cov-2 infection and should not be used as the sole basis for treatment  or other patient management decisions. A negative result may occur with  improper specimen collection/handling, submission of specimen other than nasopharyngeal swab, presence of viral mutation(s) within the areas targeted by this assay, and inadequate number of viral copies(<138 copies/mL). A negative result must be combined with clinical observations, patient history, and epidemiological information. The expected result is Negative.  Fact Sheet for Patients:  EntrepreneurPulse.com.au  Fact Sheet for Healthcare Providers:  IncredibleEmployment.be  This test is no t yet approved or cleared by the [place] FDA and  has been authorized for detection and/or diagnosis of SARS-CoV-2 by FDA under an Emergency Use Authorization (EUA). This EUA will remain  in effect (meaning this test can be used) for the duration of the COVID-19 declaration under Section 564(b)(1) of the Act, 21 U.S.C.section 360bbb-3(b)(1), unless the authorization is  terminated  or revoked sooner.       Influenza A by PCR NEGATIVE NEGATIVE Final   Influenza B by PCR NEGATIVE NEGATIVE Final    Comment: (NOTE) The Xpert Xpress SARS-CoV-2/FLU/RSV plus assay is intended as an aid in the diagnosis of influenza from Nasopharyngeal swab specimens and should not be used as a sole basis for treatment. Nasal washings and aspirates are unacceptable for Xpert Xpress SARS-CoV-2/FLU/RSV testing.  Fact Sheet for Patients: EntrepreneurPulse.com.au  Fact Sheet for Healthcare Providers: IncredibleEmployment.be  This test is not yet approved or cleared by the [place] FDA and has been authorized for detection and/or diagnosis of SARS-CoV-2 by FDA under an Emergency Use Authorization (EUA). This EUA will remain in effect (meaning this test can be used) for the duration of the COVID-19 declaration under Section 564(b)(1) of the Act, 21 U.S.C. section 360bbb-3(b)(1), unless the authorization is terminated or revoked.  Performed at [facility], [address], [place], [place] [address]   Culture, blood (routine x 2)     Status: None (Preliminary result)   Collection Time: 07/03/20 12:19 PM   Specimen: BLOOD  Result Value Ref Range Status   Specimen Description BLOOD RIGHT ARM  Final   Special Requests   Final    BOTTLES DRAWN AEROBIC AND ANAEROBIC Blood Culture adequate volume   Culture   Final    NO GROWTH < 24 HOURS Performed at [facility], [address], [place],  [place] [address]    Report Status PENDING  Incomplete  MRSA PCR Screening     Status: None   Collection Time: 07/03/20  2:50 PM   Specimen: Nasal Mucosa; Nasopharyngeal  Result Value Ref Range Status   MRSA by PCR NEGATIVE NEGATIVE Final    Comment:        The GeneXpert MRSA Assay (FDA approved for NASAL specimens only), is one component of a comprehensive MRSA colonization surveillance program. It is not intended to diagnose MRSA infection  nor to guide or monitor treatment for MRSA infections. Performed at [facility], [address], [place], [place] [address]          Radiology Studies: DG Chest 2 View  Result Date: 07/03/2020 CLINICAL DATA:  Shortness of breath with altered mental status EXAM: CHEST - 2 VIEW COMPARISON:  May 28, 2020 FINDINGS: There are pleural effusions bilaterally with airspace opacity in the lung bases. Heart is mildly prominent with pulmonary vascularity normal. No adenopathy. There is degenerative change in each shoulder. IMPRESSION: Bilateral pleural effusions with airspace opacity in the lung bases, concerning for pneumonia. Heart mildly prominent. There may be a degree of underlying congestive heart failure. Electronically Signed  By: Lowella Grip III M.D.   On: 07/03/2020 14:38   CT Angio Chest PE W/Cm &/Or Wo Cm  Result Date: 07/03/2020 CLINICAL DATA:  Decreased oxygen saturation chest radiograph July 03, 2020; chest CT September 24, 2019 EXAM: CT ANGIOGRAPHY CHEST WITH CONTRAST TECHNIQUE: Multidetector CT imaging of the chest was performed using the standard protocol during bolus administration of intravenous contrast. Multiplanar CT image reconstructions and MIPs were obtained to evaluate the vascular anatomy. CONTRAST:  45m OMNIPAQUE IOHEXOL 350 MG/ML SOLN COMPARISON:  Chest radiograph July 03, 2020; chest CT September 24, 2019 FINDINGS: Cardiovascular: There is no demonstrable pulmonary embolus. There is no thoracic aortic aneurysm or dissection. There are scattered foci of calcification in proximal visualized great vessels. There are foci of aortic atherosclerosis. There are occasional foci of coronary artery calcification. No pericardial effusion or pericardial thickening. Mediastinum/Nodes: Thyroid appears unremarkable. Several subcentimeter mediastinal lymph nodes present which do not meet size criteria for pathologic significance. No esophageal lesions are evident. Lungs/Pleura: There are  pleural effusions bilaterally, larger on the right than on the left. There is consolidation in the right lower lobe with a lesser degree of atelectasis and consolidation left lower lobe. No pneumothorax. Small bullae in the apices. Trachea and major bronchial structures appear patent. Upper Abdomen: Gallbladder is absent. There is hepatic steatosis. Foci of upper abdominal aortic atherosclerosis noted. Musculoskeletal: Generalized anasarca noted with soft tissue edema throughout the chest. There is multifocal degenerative change in the thoracic spine. No blastic or lytic bone lesions. Review of the MIP images confirms the above findings. IMPRESSION: 1. No evident pulmonary embolus. No thoracic aortic aneurysm or dissection. There is aortic atherosclerosis as well as foci of great vessel and coronary artery calcification. 2. Pleural effusions bilaterally, larger on the right than the left. Atelectasis with consolidation in both lower lobes concerning for superimposed pneumonia, more severe on the right than the left. Mild bullous disease in the apices. 3. Generalized anasarca. Question a degree of underlying congestive heart failure given the anasarca with sizable pleural effusions. 4.  Hepatic steatosis. 5.  Gallbladder absent. 6.  No evident adenopathy. Aortic Atherosclerosis (ICD10-I70.0). Electronically Signed   By: WLowella GripIII M.D.   On: 07/03/2020 14:37   DG Chest Port 1 View  Result Date: 07/04/2020 CLINICAL DATA:  70year old female with bilateral pleural effusions and atelectasis versus pneumonia. EXAM: PORTABLE CHEST 1 VIEW COMPARISON:  Chest CTA 07/03/2020 and earlier. FINDINGS: Portable AP semi upright view at 0429 hours. Right PICC line appears stable. Improved lung volumes from portable exam yesterday. Continued veiling opacity at the lung bases greater on the right. Mediastinal contours within normal limits. No pneumothorax. No pulmonary edema. No areas of worsening ventilation.  IMPRESSION: Improved lung volumes since yesterday. Stable right greater than left pleural effusions with associated lower lobe atelectasis versus infection. No new cardiopulmonary abnormality. Electronically Signed   By: HGenevie AnnM.D.   On: 07/04/2020 06:42        Scheduled Meds: . acetaZOLAMIDE  250 mg Oral Daily  . apixaban  5 mg Oral BID  . atorvastatin  20 mg Oral Daily  . Chlorhexidine Gluconate Cloth  6 each Topical Daily  . furosemide  40 mg Intravenous Daily  . insulin aspart  0-15 Units Subcutaneous Q4H  . ipratropium-albuterol  3 mL Nebulization Q6H  . levothyroxine  50 mcg Oral Q0600  . methylPREDNISolone (SOLU-MEDROL) injection  60 mg Intravenous Q12H   Followed by  . [START ON 07/05/2020] predniSONE  40  mg Oral Q breakfast  . pantoprazole  40 mg Oral Daily   Continuous Infusions: . ertapenem Stopped (07/03/20 1938)  . vancomycin       LOS: 1 day    Time spent: 35 mins    Kathie Dike, MD [facility]   If 7PM-7AM, please contact night-coverage [email or web address]  07/04/2020, 10:00 AM

## 2020-07-04 NOTE — Progress Notes (Signed)
Patient CBG 16... patient asymptomatic. Orange juice with 3 sugar packets given and patient eating. Will recheck.

## 2020-07-05 DIAGNOSIS — J189 Pneumonia, unspecified organism: Secondary | ICD-10-CM | POA: Diagnosis not present

## 2020-07-05 DIAGNOSIS — J441 Chronic obstructive pulmonary disease with (acute) exacerbation: Secondary | ICD-10-CM | POA: Diagnosis not present

## 2020-07-05 DIAGNOSIS — J9621 Acute and chronic respiratory failure with hypoxia: Secondary | ICD-10-CM | POA: Diagnosis not present

## 2020-07-05 DIAGNOSIS — J90 Pleural effusion, not elsewhere classified: Secondary | ICD-10-CM | POA: Diagnosis not present

## 2020-07-05 LAB — CBC
HCT: 26.6 % — ABNORMAL LOW (ref 36.0–46.0)
Hemoglobin: 8.6 g/dL — ABNORMAL LOW (ref 12.0–15.0)
MCH: 33.7 pg (ref 26.0–34.0)
MCHC: 32.3 g/dL (ref 30.0–36.0)
MCV: 104.3 fL — ABNORMAL HIGH (ref 80.0–100.0)
Platelets: 220 10*3/uL (ref 150–400)
RBC: 2.55 MIL/uL — ABNORMAL LOW (ref 3.87–5.11)
RDW: 13.2 % (ref 11.5–15.5)
WBC: 8.1 10*3/uL (ref 4.0–10.5)
nRBC: 0 % (ref 0.0–0.2)

## 2020-07-05 LAB — GLUCOSE, CAPILLARY
Glucose-Capillary: 111 mg/dL — ABNORMAL HIGH (ref 70–99)
Glucose-Capillary: 105 mg/dL — ABNORMAL HIGH (ref 70–99)
Glucose-Capillary: 157 mg/dL — ABNORMAL HIGH (ref 70–99)
Glucose-Capillary: 132 mg/dL — ABNORMAL HIGH (ref 70–99)
Glucose-Capillary: 163 mg/dL — ABNORMAL HIGH (ref 70–99)
Glucose-Capillary: 162 mg/dL — ABNORMAL HIGH (ref 70–99)

## 2020-07-05 LAB — COMPREHENSIVE METABOLIC PANEL
ALT: 10 U/L (ref 0–44)
AST: 9 U/L — ABNORMAL LOW (ref 15–41)
Albumin: 2.7 g/dL — ABNORMAL LOW (ref 3.5–5.0)
Alkaline Phosphatase: 45 U/L (ref 38–126)
Anion gap: 8 (ref 5–15)
BUN: 29 mg/dL — ABNORMAL HIGH (ref 8–23)
CO2: 27 mmol/L (ref 22–32)
Calcium: 8.7 mg/dL — ABNORMAL LOW (ref 8.9–10.3)
Chloride: 104 mmol/L (ref 98–111)
Creatinine, Ser: 1.54 mg/dL — ABNORMAL HIGH (ref 0.44–1.00)
GFR, Estimated: 37 mL/min — ABNORMAL LOW (ref >60)
Glucose, Bld: 111 mg/dL — ABNORMAL HIGH (ref 70–99)
Potassium: 3.1 mmol/L — ABNORMAL LOW (ref 3.5–5.1)
Sodium: 139 mmol/L (ref 135–145)
Total Bilirubin: 0.6 mg/dL (ref 0.3–1.2)
Total Protein: 5.4 g/dL — ABNORMAL LOW (ref 6.5–8.1)

## 2020-07-05 NOTE — Progress Notes (Signed)
PROGRESS NOTE    Tara [name]  [id number] DOB: [birth year] DOA: 07/03/2020 PCP: Neale Burly, MD    Brief Narrative:  71 y/o female with history of COPD on home oxygen at 2L, admitted with acute on chronic hypoxic and hypercapnic respiratory failure.  She had associated metabolic encephalopathy from hypercapnia.  She was started on BiPAP, steroids/bronchodilators for COPD as well as antibiotics for possible pneumonia.  She did have some pleural effusions and was also treated with Lasix.  PCCM following.   Assessment & Plan:   Active Problems:   Acute respiratory failure with hypoxia and hypercapnia (HCC)   COPD exacerbation (HCC)   Pleural effusion   Acute on chronic respiratory failure (HCC)   Acute on chronic hypoxic and hypercapnic respiratory failure -Chronically on 2 L of oxygen -On admission, noted to be hypoxic at 52% on 2 L -ABG showed hypoxia and hypercapnia -Started on BiPAP with improved blood gas -pH is now normalized and PCO2 is improved -Mental status appears to be doing better -she is currently back on 2L oxygen -Appreciate PCCM input -CTA chest negative for pulmonary embolism  OSA -reports having a CPAP machine at home, but has not been able to use it in quite some time due to defective tubing -will ask TOC to help with CPAP supplies on discharge -will start on cpap qhs  COPD exacerbation -Continue on IV steroids, bronchodilators -transition to prednisone taper tomorrow  Acute metabolic encephalopathy secondary to hypercapnia -Mental status has improved with BiPAP treatment -now back to baseline  Possible HCAP -Currently on intravenous Invanz -MRSA PCR is negative, vancomycin discontinued  Acute on chronic diastolic congestive heart failure with bilateral pleural effusions -She was started on intravenous Lasix -she had 2.3L of urine output yesterday -Echocardiogram shows EF of 65-70% with grade 1 diastolic dysfunction -she has had a mild  bump in creatinine so will hold further diuretics for today -Monitor intake and output  Atrial fibrillation -Chronically on diltiazem which was held due to soft blood pressures -Resume as blood pressure will allow -Anticoagulated with Eliquis  Hyperlipidemia -Continue statin  Recent admission at [facility] for acute diverticulitis and concern for sigmoid colon abscess -She was discharged on Invanz to complete a total of 14 days (this will be completed on 5/11)  Hypothyroidism -Continue Synthroid  Generalized weakness -physical therapy evaluation   DVT prophylaxis:  apixaban (ELIQUIS) tablet 5 mg  Code Status: Partial code Family Communication: Discussed with daughter at the bedside Disposition Plan: Status is: Inpatient  Remains inpatient appropriate because:Inpatient level of care appropriate due to severity of illness   Dispo: The patient is from: Home              Anticipated d/c is to: TBD, may need placement              Patient currently is not medically stable to d/c.   Difficult to place patient No    Consultants:   PCCM  Procedures:     Antimicrobials:   Vancomycin 4/29 >4/30  Ertapenem 4/29 >   Subjective: Patient reports wearing bipap overnight. Feels that her breathing is improving today. She has a mild cough  Objective: Vitals:   07/05/20 0500 07/05/20 0700 07/05/20 0739 07/05/20 0800  BP: (!) 118/55 119/62  126/61  Pulse: (!) 51 (!) 48  62  Resp: '15 17  15  '$ Temp:   98.2 F (36.8 C)   TempSrc:   Oral   SpO2: 98% 98%  100%  Weight:      Height:        Intake/Output Summary (Last 24 hours) at 07/05/2020 1156 Last data filed at 07/05/2020 0500 Gross per 24 hour  Intake 1030 ml  Output 1950 ml  Net -920 ml   Filed Weights   07/03/20 2226 07/04/20 0418 07/05/20 0350  Weight: 65.1 kg 64.1 kg 63.8 kg    Examination:  General exam: Alert, awake, oriented x 3 Respiratory system: Clear to auscultation. Respiratory effort  normal. Cardiovascular system:RRR. No murmurs, rubs, gallops. Gastrointestinal system: Abdomen is nondistended, soft and nontender. No organomegaly or masses felt. Normal bowel sounds heard. Central nervous system: Alert and oriented. No focal neurological deficits. Extremities: No C/C/E, +pedal pulses Skin: No rashes, lesions or ulcers Psychiatry: Judgement and insight appear normal. Mood & affect appropriate.     Data Reviewed: I have personally reviewed following labs and imaging studies  CBC: Recent Labs  Lab 07/03/20 1215 07/04/20 0412 07/05/20 0343  WBC 6.1 4.9 8.1  NEUTROABS 4.2  --   --   HGB 10.2* 8.9* 8.6*  HCT 33.2* 28.8* 26.6*  MCV 109.6* 109.1* 104.3*  PLT 233 199 [id number]   Basic Metabolic Panel: Recent Labs  Lab 07/03/20 1215 07/04/20 0412 07/05/20 0343  NA 138 139 139  K 4.1 3.6 3.1*  CL 106 108 104  CO2 26 21* 27  GLUCOSE 125* 141* 111*  BUN 16 19 29*  CREATININE 1.44* 1.49* 1.54*  CALCIUM 9.1 8.9 8.7*   GFR: Estimated Creatinine Clearance: 31.5 mL/min (A) (by C-G formula based on SCr of 1.54 mg/dL (H)). Liver Function Tests: Recent Labs  Lab 07/03/20 1215 07/04/20 0412 07/05/20 0343  AST 12* 11* 9*  ALT '13 11 10  '$ ALKPHOS 56 48 45  BILITOT 0.5 0.6 0.6  PROT 6.1* 5.5* 5.4*  ALBUMIN 3.2* 2.8* 2.7*   No results for input(s): LIPASE, AMYLASE in the last 168 hours. No results for input(s): AMMONIA in the last 168 hours. Coagulation Profile: No results for input(s): INR, PROTIME in the last 168 hours. Cardiac Enzymes: No results for input(s): CKTOTAL, CKMB, CKMBINDEX, TROPONINI in the last 168 hours. BNP (last 3 results) No results for input(s): PROBNP in the last 8760 hours. HbA1C: No results for input(s): HGBA1C in the last 72 hours. CBG: Recent Labs  Lab 07/04/20 1942 07/04/20 2332 07/05/20 0345 07/05/20 0741 07/05/20 1139  GLUCAP 179* 122* 111* 105* 157*   Lipid Profile: No results for input(s): CHOL, HDL, LDLCALC, TRIG, CHOLHDL,  LDLDIRECT in the last 72 hours. Thyroid Function Tests: No results for input(s): TSH, T4TOTAL, FREET4, T3FREE, THYROIDAB in the last 72 hours. Anemia Panel: No results for input(s): VITAMINB12, FOLATE, FERRITIN, TIBC, IRON, RETICCTPCT in the last 72 hours. Sepsis Labs: Recent Labs  Lab 07/03/20 1215 07/03/20 1440  LATICACIDVEN 0.7 1.0    Recent Results (from the past 240 hour(s))  Culture, blood (routine x 2)     Status: None (Preliminary result)   Collection Time: 07/03/20 12:16 PM   Specimen: BLOOD  Result Value Ref Range Status   Specimen Description BLOOD LEFT WRIST  Final   Special Requests   Final    BOTTLES DRAWN AEROBIC AND ANAEROBIC Blood Culture adequate volume   Culture   Final    NO GROWTH 2 DAYS Performed at [facility], [address] [place], [place] [address]    Report Status PENDING  Incomplete  Resp Panel by RT-PCR (Flu A&B, Covid) Nasopharyngeal Swab     Status: None  Collection Time: 07/03/20 12:16 PM   Specimen: Nasopharyngeal Swab; Nasopharyngeal(NP) swabs in vial transport medium  Result Value Ref Range Status   SARS Coronavirus 2 by RT PCR NEGATIVE NEGATIVE Final    Comment: (NOTE) SARS-CoV-2 target nucleic acids are NOT DETECTED.  The SARS-CoV-2 RNA is generally detectable in upper respiratory specimens during the acute phase of infection. The lowest concentration of SARS-CoV-2 viral copies this assay can detect is 138 copies/mL. A negative result does not preclude SARS-Cov-2 infection and should not be used as the sole basis for treatment or other patient management decisions. A negative result may occur with  improper specimen collection/handling, submission of specimen other than nasopharyngeal swab, presence of viral mutation(s) within the areas targeted by this assay, and inadequate number of viral copies(<138 copies/mL). A negative result must be combined with clinical observations, patient history, and epidemiological information. The  expected result is Negative.  Fact Sheet for Patients:  EntrepreneurPulse.com.au  Fact Sheet for Healthcare Providers:  IncredibleEmployment.be  This test is no t yet approved or cleared by the [place] FDA and  has been authorized for detection and/or diagnosis of SARS-CoV-2 by FDA under an Emergency Use Authorization (EUA). This EUA will remain  in effect (meaning this test can be used) for the duration of the COVID-19 declaration under Section 564(b)(1) of the Act, 21 U.S.C.section 360bbb-3(b)(1), unless the authorization is terminated  or revoked sooner.       Influenza A by PCR NEGATIVE NEGATIVE Final   Influenza B by PCR NEGATIVE NEGATIVE Final    Comment: (NOTE) The Xpert Xpress SARS-CoV-2/FLU/RSV plus assay is intended as an aid in the diagnosis of influenza from Nasopharyngeal swab specimens and should not be used as a sole basis for treatment. Nasal washings and aspirates are unacceptable for Xpert Xpress SARS-CoV-2/FLU/RSV testing.  Fact Sheet for Patients: EntrepreneurPulse.com.au  Fact Sheet for Healthcare Providers: IncredibleEmployment.be  This test is not yet approved or cleared by the [place] FDA and has been authorized for detection and/or diagnosis of SARS-CoV-2 by FDA under an Emergency Use Authorization (EUA). This EUA will remain in effect (meaning this test can be used) for the duration of the COVID-19 declaration under Section 564(b)(1) of the Act, 21 U.S.C. section 360bbb-3(b)(1), unless the authorization is terminated or revoked.  Performed at [facility], [address], [place], [place] [address]   Culture, blood (routine x 2)     Status: None (Preliminary result)   Collection Time: 07/03/20 12:19 PM   Specimen: BLOOD  Result Value Ref Range Status   Specimen Description BLOOD RIGHT ARM  Final   Special Requests   Final    BOTTLES DRAWN AEROBIC AND ANAEROBIC  Blood Culture adequate volume   Culture   Final    NO GROWTH 2 DAYS Performed at [facility], [address], [place], [place] [address]    Report Status PENDING  Incomplete  MRSA PCR Screening     Status: None   Collection Time: 07/03/20  2:50 PM   Specimen: Nasal Mucosa; Nasopharyngeal  Result Value Ref Range Status   MRSA by PCR NEGATIVE NEGATIVE Final    Comment:        The GeneXpert MRSA Assay (FDA approved for NASAL specimens only), is one component of a comprehensive MRSA colonization surveillance program. It is not intended to diagnose MRSA infection nor to guide or monitor treatment for MRSA infections. Performed at [facility], [address], [place], [place] [address]  Radiology Studies: DG Chest 2 View  Result Date: 07/03/2020 CLINICAL DATA:  Shortness of breath with altered mental status EXAM: CHEST - 2 VIEW COMPARISON:  May 28, 2020 FINDINGS: There are pleural effusions bilaterally with airspace opacity in the lung bases. Heart is mildly prominent with pulmonary vascularity normal. No adenopathy. There is degenerative change in each shoulder. IMPRESSION: Bilateral pleural effusions with airspace opacity in the lung bases, concerning for pneumonia. Heart mildly prominent. There may be a degree of underlying congestive heart failure. Electronically Signed   By: Lowella Grip III M.D.   On: 07/03/2020 14:38   CT Angio Chest PE W/Cm &/Or Wo Cm  Result Date: 07/03/2020 CLINICAL DATA:  Decreased oxygen saturation chest radiograph July 03, 2020; chest CT September 24, 2019 EXAM: CT ANGIOGRAPHY CHEST WITH CONTRAST TECHNIQUE: Multidetector CT imaging of the chest was performed using the standard protocol during bolus administration of intravenous contrast. Multiplanar CT image reconstructions and MIPs were obtained to evaluate the vascular anatomy. CONTRAST:  17m OMNIPAQUE IOHEXOL 350 MG/ML SOLN COMPARISON:  Chest radiograph July 03, 2020; chest CT September 24, 2019  FINDINGS: Cardiovascular: There is no demonstrable pulmonary embolus. There is no thoracic aortic aneurysm or dissection. There are scattered foci of calcification in proximal visualized great vessels. There are foci of aortic atherosclerosis. There are occasional foci of coronary artery calcification. No pericardial effusion or pericardial thickening. Mediastinum/Nodes: Thyroid appears unremarkable. Several subcentimeter mediastinal lymph nodes present which do not meet size criteria for pathologic significance. No esophageal lesions are evident. Lungs/Pleura: There are pleural effusions bilaterally, larger on the right than on the left. There is consolidation in the right lower lobe with a lesser degree of atelectasis and consolidation left lower lobe. No pneumothorax. Small bullae in the apices. Trachea and major bronchial structures appear patent. Upper Abdomen: Gallbladder is absent. There is hepatic steatosis. Foci of upper abdominal aortic atherosclerosis noted. Musculoskeletal: Generalized anasarca noted with soft tissue edema throughout the chest. There is multifocal degenerative change in the thoracic spine. No blastic or lytic bone lesions. Review of the MIP images confirms the above findings. IMPRESSION: 1. No evident pulmonary embolus. No thoracic aortic aneurysm or dissection. There is aortic atherosclerosis as well as foci of great vessel and coronary artery calcification. 2. Pleural effusions bilaterally, larger on the right than the left. Atelectasis with consolidation in both lower lobes concerning for superimposed pneumonia, more severe on the right than the left. Mild bullous disease in the apices. 3. Generalized anasarca. Question a degree of underlying congestive heart failure given the anasarca with sizable pleural effusions. 4.  Hepatic steatosis. 5.  Gallbladder absent. 6.  No evident adenopathy. Aortic Atherosclerosis (ICD10-I70.0). Electronically Signed   By: WLowella GripIII M.D.    On: 07/03/2020 14:37   DG Chest Port 1 View  Result Date: 07/04/2020 CLINICAL DATA:  70year old female with bilateral pleural effusions and atelectasis versus pneumonia. EXAM: PORTABLE CHEST 1 VIEW COMPARISON:  Chest CTA 07/03/2020 and earlier. FINDINGS: Portable AP semi upright view at 0429 hours. Right PICC line appears stable. Improved lung volumes from portable exam yesterday. Continued veiling opacity at the lung bases greater on the right. Mediastinal contours within normal limits. No pneumothorax. No pulmonary edema. No areas of worsening ventilation. IMPRESSION: Improved lung volumes since yesterday. Stable right greater than left pleural effusions with associated lower lobe atelectasis versus infection. No new cardiopulmonary abnormality. Electronically Signed   By: HGenevie AnnM.D.   On: 07/04/2020 06:42   ECHOCARDIOGRAM COMPLETE  Result  Date: 07/04/2020    ECHOCARDIOGRAM REPORT   Patient Name:   CHARIYAH WISZ Date of Exam: 07/04/2020 Medical Rec #:  [id number]      Height:       63.0 in Accession #:    [id number]     Weight:       141.3 lb Date of Birth:  September 27, 1950     BSA:          1.668 m Patient Age:    52 years       BP:           107/54 mmHg Patient Gender: F              HR:           74 bpm. Exam Location:  [facility] Procedure: 2D Echo, Cardiac Doppler and Color Doppler Indications:    Dyspnea  History:        Patient has no prior history of Echocardiogram examinations.                 CHF, COPD, Signs/Symptoms:Shortness of Breath; Risk                 Factors:Hypertension and Diabetes. Pneumonia, pleural effusions.  Sonographer:    Dustin Flock RDCS Referring Phys: [place]  1. Left ventricular ejection fraction, by estimation, is 65 to 70%. The left ventricle has normal function. The left ventricle has no regional wall motion abnormalities. Left ventricular diastolic parameters are consistent with Grade I diastolic dysfunction (impaired relaxation).  2.  Right ventricular systolic function is normal. The right ventricular size is normal. There is normal pulmonary artery systolic pressure. The estimated right ventricular systolic pressure is [id number] mmHg.  3. The mitral valve is abnormal. Trivial mitral valve regurgitation.  4. The aortic valve is tricuspid. Aortic valve regurgitation is not visualized.  5. The inferior vena cava is dilated in size with >50% respiratory variability, suggesting right atrial pressure of 8 mmHg. Comparison(s): No prior Echocardiogram. FINDINGS  Left Ventricle: Left ventricular ejection fraction, by estimation, is 65 to 70%. The left ventricle has normal function. The left ventricle has no regional wall motion abnormalities. The left ventricular internal cavity size was normal in size. There is  no left ventricular hypertrophy. Left ventricular diastolic parameters are consistent with Grade I diastolic dysfunction (impaired relaxation). Indeterminate filling pressures. Right Ventricle: The right ventricular size is normal. No increase in right ventricular wall thickness. Right ventricular systolic function is normal. There is normal pulmonary artery systolic pressure. The tricuspid regurgitant velocity is 2.55 m/s, and  with an assumed right atrial pressure of 8 mmHg, the estimated right ventricular systolic pressure is [id number] mmHg. Left Atrium: Left atrial size was normal in size. Right Atrium: Right atrial size was normal in size. Pericardium: There is no evidence of pericardial effusion. Mitral Valve: The mitral valve is abnormal. There is mild thickening of the mitral valve leaflet(s). Mild mitral annular calcification. Trivial mitral valve regurgitation. Tricuspid Valve: The tricuspid valve is grossly normal. Tricuspid valve regurgitation is trivial. Aortic Valve: The aortic valve is tricuspid. Aortic valve regurgitation is not visualized. Pulmonic Valve: The pulmonic valve was normal in structure. Pulmonic valve regurgitation is not  visualized. Aorta: The aortic root and ascending aorta are structurally normal, with no evidence of dilitation. Venous: The inferior vena cava is dilated in size with greater than 50% respiratory variability, suggesting right atrial pressure of 8 mmHg. IAS/Shunts: No atrial level shunt detected  by color flow Doppler.  LEFT VENTRICLE PLAX 2D LVIDd:         4.21 cm  Diastology LVIDs:         2.56 cm  LV e' medial:    7.40 cm/s LV PW:         0.99 cm  LV E/e' medial:  14.6 LV IVS:        0.94 cm  LV e' lateral:   6.42 cm/s LVOT diam:     1.90 cm  LV E/e' lateral: 16.8 LV SV:         87 LV SV Index:   52 LVOT Area:     2.84 cm  RIGHT VENTRICLE RV Basal diam:  2.85 cm RV S prime:     8.92 cm/s TAPSE (M-mode): 2.3 cm LEFT ATRIUM             Index       RIGHT ATRIUM           Index LA diam:        3.90 cm 2.34 cm/m  RA Area:     13.60 cm LA Vol (A2C):   46.8 ml 28.05 ml/m RA Volume:   33.30 ml  19.96 ml/m LA Vol (A4C):   28.8 ml 17.26 ml/m LA Biplane Vol: 36.6 ml 21.94 ml/m  AORTIC VALVE LVOT Vmax:   120.00 cm/s LVOT Vmean:  82.800 cm/s LVOT VTI:    0.306 m  AORTA Ao Root diam: 2.50 cm MITRAL VALVE                TRICUSPID VALVE MV Area (PHT): 3.15 cm     TR Peak grad:   26.0 mmHg MV Decel Time: 241 msec     TR Vmax:        255.00 cm/s MV E velocity: 108.00 cm/s MV A velocity: 94.60 cm/s   SHUNTS MV E/A ratio:  1.14         Systemic VTI:  0.31 m                             Systemic Diam: 1.90 cm Lyman Bishop MD Electronically signed by Lyman Bishop MD Signature Date/Time: 07/04/2020/12:43:03 PM    Final         Scheduled Meds: . acetaZOLAMIDE  250 mg Oral Daily  . apixaban  5 mg Oral BID  . atorvastatin  20 mg Oral Daily  . Chlorhexidine Gluconate Cloth  6 each Topical Daily  . insulin aspart  0-15 Units Subcutaneous Q4H  . ipratropium-albuterol  3 mL Nebulization TID  . levothyroxine  50 mcg Oral Q0600  . pantoprazole  40 mg Oral Daily  . predniSONE  40 mg Oral Q breakfast   Continuous  Infusions: . ertapenem 1,000 mg (07/04/20 1738)     LOS: 2 days    Time spent: 35 mins    Kathie Dike, MD [facility]   If 7PM-7AM, please contact night-coverage [email or web address]  07/05/2020, 11:56 AM

## 2020-07-05 NOTE — Plan of Care (Signed)
  Problem: Acute Rehab PT Goals(only PT should resolve) Goal: Pt Will Go Sit To Supine/Side Outcome: Progressing Flowsheets (Taken 07/05/2020 1351) Pt will go Sit to Supine/Side: Independently Goal: Patient Will Transfer Sit To/From Stand Outcome: Progressing Flowsheets (Taken 07/05/2020 1351) Patient will transfer sit to/from stand: Independently Goal: Pt Will Transfer Bed To Chair/Chair To Bed Outcome: Progressing Flowsheets (Taken 07/05/2020 1351) Pt will Transfer Bed to Chair/Chair to Bed: Independently Goal: Pt Will Ambulate Outcome: Progressing Flowsheets (Taken 07/05/2020 1351) Pt will Ambulate:  100 feet  with supervision  with rolling walker Goal: Pt Will Go Up/Down Stairs Outcome: Progressing Flowsheets (Taken 07/05/2020 1351) Pt will Go Up / Down Stairs:  3-5 stairs  with supervision  with rail(s)   1:52 PM, 07/05/20 M. Sherlyn Lees, PT, DPT Physical Therapist- [place] Office Number: [phone]

## 2020-07-05 NOTE — Evaluation (Signed)
Physical Therapy Evaluation Patient Details Name: Tara [name] MRN: [id number] DOB: [birth year] Today's Date: 07/05/2020   History of Present Illness  Tara [name]  is a 70 y.o. female, with past medical history of CHF, COPD, on 2 L nasal cannula, diabetes, A. fib on Eliquis, hypertension, patient presents to ED secondary to shortness of breath, she was recently discharged on Wednesday from [facility], for colitis/diverticulitis, with possible microabscesses, to finish total of 2 weeks of Invanz at home, patient was noted to be hypoxic 58% on 2 L at home per EMS, she was 80% on 2 L in ED, patient is somnolent, confused, cannot provide significant history, it was obtained from daughter at bedside, reports mother has been weak, frail, with poor appetite since discharge, and today he is less interactive and responsive which prompted her to call ED.  - in ED her ABG  with pH of 7.1, creatinine of 1.4, BNP of 453, CTA chest with no evidence of PE, but significant for pleural effusion and pneumonia, triage hospitalist consulted to admit   Clinical Impression   Pt exhibits generalized weakness, reduced functional activity tolerance, increased need for caregiver assistance, unsteadiness on feet, decreased ability to safely ambulate, and as a result higher risk for falls.  PT services indicated to improve general conditioning and facilitate safe ambulation practices to improve capabilities to PLOF to facilitate safe D/C to home environment    Follow Up Recommendations Home health PT    Equipment Recommendations  None recommended by PT    Recommendations for Other Services       Precautions / Restrictions        Mobility  Bed Mobility Overal bed mobility: Needs Assistance Bed Mobility: Supine to Sit;Sit to Supine     Supine to sit: Min guard Sit to supine: Min assist     Patient Response: Cooperative  Transfers Overall transfer level: Needs assistance Equipment used:  Rolling walker (2 wheeled) Transfers: Sit to/from [facility] Sit to Stand: Min guard Stand pivot transfers: Min guard          Ambulation/Gait Ambulation/Gait assistance: Min guard Gait Distance (Feet): 15 Feet Assistive device: Rolling walker (2 wheeled) Gait Pattern/deviations: Wide base of support     General Gait Details: fatigue/exertion  Stairs            Wheelchair Mobility    Modified Rankin (Stroke Patients Only)       Balance Overall balance assessment: Mild deficits observed, not formally tested                                           Pertinent Vitals/Pain Pain Assessment: No/denies pain    Home Living Family/patient expects to be discharged to:: Private residence Living Arrangements: Children Available Help at Discharge: Family Type of Home: House Home Access: Stairs to enter Entrance Stairs-Rails: Can reach both Entrance Stairs-Number of Steps: 6 Home Layout: One level Home Equipment: Environmental consultant - 2 wheels;Cane - single point      Prior Function Level of Independence: Independent with assistive device(s)               Hand Dominance        Extremity/Trunk Assessment   Upper Extremity Assessment Upper Extremity Assessment: Defer to OT evaluation    Lower Extremity Assessment Lower Extremity Assessment: Generalized weakness       Communication  Communication: No difficulties  Cognition Arousal/Alertness: Awake/alert Behavior During Therapy: WFL for tasks assessed/performed Overall Cognitive Status: Within Functional Limits for tasks assessed                                        General Comments      Exercises General Exercises - Lower Extremity Ankle Circles/Pumps: AROM;Both;20 reps Long Arc Quad: AROM;Both;20 reps Heel Slides: AROM;Both;20 reps   Assessment/Plan    PT Assessment Patient needs continued PT services  PT Problem List Decreased strength;Decreased  activity tolerance;Decreased balance;Decreased mobility;Cardiopulmonary status limiting activity       PT Treatment Interventions Gait training;Stair training;Functional mobility training;Therapeutic activities;Patient/family education;Balance training;Therapeutic exercise;Manual techniques    PT Goals (Current goals can be found in the Care Plan section)  Acute Rehab PT Goals Patient Stated Goal: return home PT Goal Formulation: With patient Time For Goal Achievement: 07/11/20 Potential to Achieve Goals: Good    Frequency Min 3X/week   Barriers to discharge   none at this time    Co-evaluation               AM-PAC PT "6 Clicks" Mobility  Outcome Measure Help needed turning from your back to your side while in a flat bed without using bedrails?: A Little Help needed moving from lying on your back to sitting on the side of a flat bed without using bedrails?: A Little Help needed moving to and from a bed to a chair (including a wheelchair)?: A Little Help needed standing up from a chair using your arms (e.g., wheelchair or bedside chair)?: A Little Help needed to walk in hospital room?: A Little Help needed climbing 3-5 steps with a railing? : A Little 6 Click Score: 18    End of Session Equipment Utilized During Treatment: Gait belt Activity Tolerance: Patient limited by fatigue Patient left: in bed;with family/visitor present Nurse Communication: Mobility status PT Visit Diagnosis: Unsteadiness on feet (R26.81);Other abnormalities of gait and mobility (R26.89);Muscle weakness (generalized) (M62.81)    Time: [id number] PT Time Calculation (min) (ACUTE ONLY): 30 min   Charges:   PT Evaluation $PT Eval Low Complexity: 1 Low PT Treatments $Therapeutic Exercise: 8-22 mins      1:50 PM, 07/05/20 M. Sherlyn Lees, PT, DPT Physical Therapist- [place] Office Number: [phone]

## 2020-07-06 DIAGNOSIS — J441 Chronic obstructive pulmonary disease with (acute) exacerbation: Secondary | ICD-10-CM | POA: Diagnosis not present

## 2020-07-06 DIAGNOSIS — J9622 Acute and chronic respiratory failure with hypercapnia: Secondary | ICD-10-CM | POA: Diagnosis not present

## 2020-07-06 DIAGNOSIS — J9621 Acute and chronic respiratory failure with hypoxia: Secondary | ICD-10-CM | POA: Diagnosis not present

## 2020-07-06 LAB — CBC
HCT: 27.5 % — ABNORMAL LOW (ref 36.0–46.0)
Hemoglobin: 8.9 g/dL — ABNORMAL LOW (ref 12.0–15.0)
MCH: 34.2 pg — ABNORMAL HIGH (ref 26.0–34.0)
MCHC: 32.4 g/dL (ref 30.0–36.0)
MCV: 105.8 fL — ABNORMAL HIGH (ref 80.0–100.0)
Platelets: 220 10*3/uL (ref 150–400)
RBC: 2.6 MIL/uL — ABNORMAL LOW (ref 3.87–5.11)
RDW: 13.5 % (ref 11.5–15.5)
WBC: 9.4 10*3/uL (ref 4.0–10.5)
nRBC: 0 % (ref 0.0–0.2)

## 2020-07-06 LAB — BASIC METABOLIC PANEL
Anion gap: 5 (ref 5–15)
BUN: 32 mg/dL — ABNORMAL HIGH (ref 8–23)
CO2: 30 mmol/L (ref 22–32)
Calcium: 8.8 mg/dL — ABNORMAL LOW (ref 8.9–10.3)
Chloride: 106 mmol/L (ref 98–111)
Creatinine, Ser: 1.51 mg/dL — ABNORMAL HIGH (ref 0.44–1.00)
GFR, Estimated: 37 mL/min — ABNORMAL LOW (ref >60)
Glucose, Bld: 95 mg/dL (ref 70–99)
Potassium: 3.1 mmol/L — ABNORMAL LOW (ref 3.5–5.1)
Sodium: 141 mmol/L (ref 135–145)

## 2020-07-06 LAB — HEMOGLOBIN A1C
Hgb A1c MFr Bld: 5.4 % (ref 4.8–5.6)
Mean Plasma Glucose: 108.28 mg/dL

## 2020-07-06 LAB — GLUCOSE, CAPILLARY
Glucose-Capillary: 108 mg/dL — ABNORMAL HIGH (ref 70–99)
Glucose-Capillary: 120 mg/dL — ABNORMAL HIGH (ref 70–99)
Glucose-Capillary: 82 mg/dL (ref 70–99)
Glucose-Capillary: 99 mg/dL (ref 70–99)

## 2020-07-06 LAB — LEGIONELLA PNEUMOPHILA SEROGP 1 UR AG: L. pneumophila Serogp 1 Ur Ag: NEGATIVE

## 2020-07-06 MED ORDER — ACETAZOLAMIDE 250 MG PO TABS
250.0000 mg | ORAL_TABLET | Freq: Every day | ORAL | Status: DC
  Filled 2020-07-06 (×2): qty 1

## 2020-07-06 MED ORDER — LIVING BETTER WITH HEART FAILURE BOOK: Freq: Once | Status: AC

## 2020-07-06 MED ORDER — IPRATROPIUM-ALBUTEROL 0.5-2.5 (3) MG/3ML IN SOLN: 3.0000 mL | Freq: Two times a day (BID) | RESPIRATORY_TRACT | Status: DC

## 2020-07-06 MED ORDER — POTASSIUM CHLORIDE CRYS ER 20 MEQ PO TBCR
40.0000 meq | EXTENDED_RELEASE_TABLET | Freq: Once | ORAL | Status: AC
  Administered 2020-07-06: 40 meq via ORAL
  Filled 2020-07-06: qty 2

## 2020-07-06 MED ORDER — FUROSEMIDE 20 MG PO TABS: 20.0000 mg | ORAL_TABLET | Freq: Every day | ORAL | 11 refills | Status: AC

## 2020-07-06 MED ORDER — METHYLPREDNISOLONE 4 MG PO TBPK: ORAL_TABLET | ORAL | 0 refills | Status: DC

## 2020-07-06 NOTE — Care Management Important Message (Signed)
Important Message  Patient Details  Name: Tara [name] MRN: [id number] Date of Birth: 07-Dec-1950   Medicare Important Message Given:  Yes     Tommy Medal 07/06/2020, 1:01 PM

## 2020-07-06 NOTE — Progress Notes (Signed)
Physical Therapy Treatment Patient Details Name: Tara [name] MRN: [id number] DOB: 1950-12-08 Today's Date: 07/06/2020    History of Present Illness Tara [name]  is a 70 y.o. female, with past medical history of CHF, COPD, on 2 L nasal cannula, diabetes, A. fib on Eliquis, hypertension, patient presents to ED secondary to shortness of breath, she was recently discharged on Wednesday from [facility], for colitis/diverticulitis, with possible microabscesses, to finish total of 2 weeks of Invanz at home, patient was noted to be hypoxic 58% on 2 L at home per EMS, she was 80% on 2 L in ED, patient is somnolent, confused, cannot provide significant history, it was obtained from daughter at bedside, reports mother has been weak, frail, with poor appetite since discharge, and today he is less interactive and responsive which prompted her to call ED.  - in ED her ABG  with pH of 7.1, creatinine of 1.4, BNP of 453, CTA chest with no evidence of PE, but significant for pleural effusion and pneumonia, triage hospitalist consulted to admit    PT Comments    Patient demonstrates slow labored movement for sitting up at bedside requiring use of bed rail, slightly unsteady when initially standing, balance improved after a few steps and able to ambulate in hallway without loss of balance, on 2 LPM with SpO2 dropping from 93% to 80% while walking, SpO2 increased to 92% after returning to room and resting chair.  Patient tolerated sitting up in chair to eat lunch after therapy.  Patient will benefit from continued physical therapy in hospital and recommended venue below to increase strength, balance, endurance for safe ADLs and gait.    Follow Up Recommendations  Home health PT     Equipment Recommendations  None recommended by PT    Recommendations for Other Services       Precautions / Restrictions Precautions Precautions: Fall Restrictions Weight Bearing Restrictions: No    Mobility   Bed Mobility Overal bed mobility: Needs Assistance Bed Mobility: Supine to Sit     Supine to sit: Supervision     General bed mobility comments: increased time, labored movement, required use of bed rail with HOB flat    Transfers Overall transfer level: Needs assistance Equipment used: Rolling walker (2 wheeled) Transfers: Sit to/from [facility] Sit to Stand: Supervision;Min guard Stand pivot transfers: Supervision;Min guard       General transfer comment: increased time, labored movement  Ambulation/Gait Ambulation/Gait assistance: Min guard Gait Distance (Feet): 50 Feet Assistive device: Rolling walker (2 wheeled) Gait Pattern/deviations: Decreased step length - left;Decreased stride length Gait velocity: decreased   General Gait Details: slightly labored cadence without loss of balance, on 2 LPM dropping from 93% to 80%, limited mostly due to fatigue   Stairs             Wheelchair Mobility    Modified Rankin (Stroke Patients Only)       Balance Overall balance assessment: Needs assistance Sitting-balance support: Feet supported;No upper extremity supported Sitting balance-Leahy Scale: Good Sitting balance - Comments: seated at EOB   Standing balance support: During functional activity;Bilateral upper extremity supported Standing balance-Leahy Scale: Fair Standing balance comment: using RW                            Cognition Arousal/Alertness: Awake/alert Behavior During Therapy: WFL for tasks assessed/performed Overall Cognitive Status: Within Functional Limits for tasks assessed  Exercises      General Comments        Pertinent Vitals/Pain Pain Assessment: No/denies pain    Home Living                      Prior Function            PT Goals (current goals can now be found in the care plan section) Acute Rehab PT Goals Patient Stated Goal:  return home PT Goal Formulation: With patient Time For Goal Achievement: 07/11/20 Potential to Achieve Goals: Good Progress towards PT goals: Progressing toward goals    Frequency    Min 3X/week      PT Plan Current plan remains appropriate    Co-evaluation              AM-PAC PT "6 Clicks" Mobility   Outcome Measure  Help needed turning from your back to your side while in a flat bed without using bedrails?: None Help needed moving from lying on your back to sitting on the side of a flat bed without using bedrails?: A Little Help needed moving to and from a bed to a chair (including a wheelchair)?: A Little Help needed standing up from a chair using your arms (e.g., wheelchair or bedside chair)?: A Little Help needed to walk in hospital room?: A Little Help needed climbing 3-5 steps with a railing? : A Little 6 Click Score: 19    End of Session Equipment Utilized During Treatment: Oxygen Activity Tolerance: Patient tolerated treatment well;Patient limited by fatigue Patient left: in chair;with call bell/phone within reach Nurse Communication: Mobility status PT Visit Diagnosis: Unsteadiness on feet (R26.81);Other abnormalities of gait and mobility (R26.89);Muscle weakness (generalized) (M62.81)     Time: [id number] PT Time Calculation (min) (ACUTE ONLY): 23 min  Charges:  $Gait Training: 8-22 mins $Therapeutic Activity: 8-22 mins                     12:28 PM, 07/06/20 Lonell Grandchild, MPT Physical Therapist with [facility] [phone] office [phone] mobile phone

## 2020-07-06 NOTE — Progress Notes (Signed)
Nsg Discharge Note  Admit Date:  07/03/2020 Discharge date: 07/06/2020   Tara [name] to be D/C'd Home per MD order.  AVS completed.  Copy for chart, and copy for patient signed, and dated. Patient/caregiver able to verbalize understanding. IV removed. PICC  line in place for home antibiotic infusion. Discharge paper work given and reviewed with patient by [facility] nurse. Patient stable upon discharge.  Discharge Medication: Allergies as of 07/06/2020      Reactions   Codeine       Medication List    TAKE these medications   acetaZOLAMIDE 250 MG tablet Commonly known as: DIAMOX Take 250 mg by mouth daily.   atorvastatin 20 MG tablet Commonly known as: LIPITOR Take 20 mg by mouth daily.   diltiazem 120 MG 12 hr capsule Commonly known as: CARDIZEM SR Take 120 mg by mouth daily.   Eliquis 5 MG Tabs tablet Generic drug: apixaban Take 1 tablet by mouth 2 (two) times daily.   ertapenem  IVPB Commonly known as: INVANZ Inject 1 g into the vein daily.   Euthyrox 50 MCG tablet Generic drug: levothyroxine Take 50 mcg by mouth daily.   Fish Oil 1000 MG Caps Take 1 capsule by mouth 3 (three) times daily.   furosemide 20 MG tablet Commonly known as: Lasix Take 1 tablet (20 mg total) by mouth daily.   levalbuterol 0.63 MG/3ML nebulizer solution Commonly known as: XOPENEX Take 0.63 mg by nebulization every 4 (four) hours as needed for wheezing or shortness of breath.   LORazepam 0.5 MG tablet Commonly known as: ATIVAN Take 0.5 mg by mouth 2 (two) times daily.   methylPREDNISolone 4 MG Tbpk tablet Commonly known as: MEDROL DOSEPAK Medrol Dosepak take as instructed. Make skip higher dose start at 40 mg, follow instruction per Dosepak for tapering down   pantoprazole 40 MG tablet Commonly known as: PROTONIX Take 1 tablet by mouth daily.   tiZANidine 4 MG tablet Commonly known as: ZANAFLEX Take 4 mg by mouth at bedtime.       Discharge Assessment: Vitals:   07/06/20 0712  07/06/20 0804  BP:  115/60  Pulse:  71  Resp:  20  Temp:  98.2 F (36.8 C)  SpO2: 95% 94%   Skin clean, dry and intact without evidence of skin break down, no evidence of skin tears noted. IV catheter discontinued intact. Site without signs and symptoms of complications - no redness or edema noted at insertion site, patient denies c/o pain - only slight tenderness at site.  Dressing with slight pressure applied.  D/c Instructions-Education: Discharge instructions given to patient/family with verbalized understanding. D/c education completed with patient/family including follow up instructions, medication list, d/c activities limitations if indicated, with other d/c instructions as indicated by MD - patient able to verbalize understanding, all questions fully answered. Patient instructed to return to ED, call 911, or call MD for any changes in condition.  Patient escorted via [place], and D/C home via private auto.  Zenaida Deed, RN 07/06/2020 1:38 PM

## 2020-07-06 NOTE — Progress Notes (Signed)
CBG 108. 

## 2020-07-06 NOTE — Discharge Summary (Signed)
Physician Discharge Summary [facility]    Patient: Tara [name]                   Admit date: 07/03/2020   DOB: 02-Jan-1951             Discharge date:07/06/2020/10:16 AM [id number]                          PCP: Neale Burly, MD  Disposition:  HOME with Home Health   Recommendations for Outpatient Follow-up:   Follow up: in 2 weeks  Please follow-up with his PCP, in 1-2 weeks, continue to need IV antibiotics, PICC line needs to be discontinued immediately post antibiotics use. Patient needs a referral to pulmonologist Continue use of CPAP per instructions   Discharge Condition: Stable   Code Status:   Code Status: Partial Code  Diet recommendation: Regular healthy diet   Discharge Diagnoses:    Active Problems:   Acute respiratory failure with hypoxia and hypercapnia (HCC)   COPD exacerbation (HCC)   Pleural effusion   Acute on chronic respiratory failure (HCC)   History of Present Illness/ Hospital Course Tara [name] Summary:   70 y/o female with history of COPD on home oxygen at 2L, admitted with acute on chronic hypoxic and hypercapnic respiratory failure.  She had associated metabolic encephalopathy from hypercapnia.  She was started on BiPAP, steroids/bronchodilators for COPD as well as antibiotics for possible pneumonia.  She did have some pleural effusions and was also treated with Lasix.  PCCM following.     Acute on chronic hypoxic and hypercapnic respiratory failure -Chronically on 2 L of oxygen -On admission, noted to be hypoxic at 52% on 2 L -ABG showed hypoxia and hypercapnia -Started on BiPAP with improved blood gas -pH is now normalized and PCO2 is improved -Mental status appears to be doing better -she is currently back on 2L oxygen---we will try to wean off, she will be evaluated for home O2 need if she meets the criteria O2 for home use will be arranged -Appreciate PCCM input -CTA chest negative for pulmonary  embolism  OSA -reports having a CPAP machine at home, but has not been able to use it in quite some time due to defective tubing -TOC to help with CPAP supplies on discharge - started on cpap qhs  COPD exacerbation -Continue on IV steroids, bronchodilators---transition to p.o. tapered steroid -transition to prednisone taper tomorrow  Acute metabolic encephalopathy secondary to hypercapnia -Mental status has improved with BiPAP treatment --discontinued -now back to baseline  Possible HCAP -Currently on intravenous Invanz -MRSA PCR is negative, vancomycin discontinued  Acute on chronic diastolic congestive heart failure with bilateral pleural effusions -She was started on intravenous Lasix --- -she had 2.3L of urine output yesterday -Echocardiogram shows EF of 65-70% with grade 1 diastolic dysfunction - hold further diuretics for today -switch to p.o. Lasix 20 mg daily prescription given   Atrial fibrillation -Chronically on diltiazem which was held due to soft blood pressures -Resume as blood pressure will allow -Anticoagulated with Eliquis  Hyperlipidemia -Continue statin  Recent admission at [facility] for acute diverticulitis and concern for sigmoid colon abscess -She was discharged on Invanz to complete a total of 14 days (this will be completed on 5/11) -Subsequently central line to be discontinued  Hypothyroidism -Continue Synthroid  Generalized weakness -physical therapy evaluation -arranging home health-PT    Code Status: Partial code Family Communication: Discussed with daughte  Dispo: The patient is from: Home  Anticipated d/c is to: Home with home health    The patient's BMI is: Body mass index is 24.72 kg/m. I agree with the assessment and plan as outlined Risk of unplanned readmission Score:     Discharge Instructions:   Discharge Instructions    (HEART FAILURE PATIENTS) Call MD:  Anytime you have any of  the following symptoms: 1) 3 pound weight gain in 24 hours or 5 pounds in 1 week 2) shortness of breath, with or without a dry hacking cough 3) swelling in the hands, feet or stomach 4) if you have to sleep on extra pillows at night in order to breathe.   Complete by: As directed    AMB referral to pulmonary rehabilitation   Complete by: As directed    Please select a program: Respiratory Care Services   Respiratory Care Services Diagnosis:  COPD-Gold 1: Mild FEV1  > 80% Predicted Dyspnea     After initial evaluation and assessments completed: Virtual Based Care may be provided alone or in conjunction with Pulmonary Rehab/Respiratory Care services based on patient barriers.: Yes   Activity as tolerated - No restrictions   Complete by: As directed    Call MD for:  persistant nausea and vomiting   Complete by: As directed    Diet - low sodium heart healthy   Complete by: As directed    Discharge instructions   Complete by: As directed    Increase activity slowly   Complete by: As directed        Medication List    TAKE these medications   acetaZOLAMIDE 250 MG tablet Commonly known as: DIAMOX Take 250 mg by mouth daily.   atorvastatin 20 MG tablet Commonly known as: LIPITOR Take 20 mg by mouth daily.   diltiazem 120 MG 12 hr capsule Commonly known as: CARDIZEM SR Take 120 mg by mouth daily.   Eliquis 5 MG Tabs tablet Generic drug: apixaban Take 1 tablet by mouth 2 (two) times daily.   ertapenem  IVPB Commonly known as: INVANZ Inject 1 g into the vein daily.   Euthyrox 50 MCG tablet Generic drug: levothyroxine Take 50 mcg by mouth daily.   Fish Oil 1000 MG Caps Take 1 capsule by mouth 3 (three) times daily.   furosemide 20 MG tablet Commonly known as: Lasix Take 1 tablet (20 mg total) by mouth daily.   levalbuterol 0.63 MG/3ML nebulizer solution Commonly known as: XOPENEX Take 0.63 mg by nebulization every 4 (four) hours as needed for wheezing or shortness of  breath.   LORazepam 0.5 MG tablet Commonly known as: ATIVAN Take 0.5 mg by mouth 2 (two) times daily.   methylPREDNISolone 4 MG Tbpk tablet Commonly known as: MEDROL DOSEPAK Medrol Dosepak take as instructed. Make skip higher dose start at 40 mg, follow instruction per Dosepak for tapering down   pantoprazole 40 MG tablet Commonly known as: PROTONIX Take 1 tablet by mouth daily.   tiZANidine 4 MG tablet Commonly known as: ZANAFLEX Take 4 mg by mouth at bedtime.       Allergies  Allergen Reactions  . Codeine      Procedures /Studies:   DG Chest 2 View  Result Date: 07/03/2020 CLINICAL DATA:  Shortness of breath with altered mental status EXAM: CHEST - 2 VIEW COMPARISON:  May 28, 2020 FINDINGS: There are pleural effusions bilaterally with airspace opacity in the lung bases. Heart is mildly prominent with pulmonary vascularity normal. No adenopathy.  There is degenerative change in each shoulder. IMPRESSION: Bilateral pleural effusions with airspace opacity in the lung bases, concerning for pneumonia. Heart mildly prominent. There may be a degree of underlying congestive heart failure. Electronically Signed   By: Lowella Grip III M.D.   On: 07/03/2020 14:38   CT Angio Chest PE W/Cm &/Or Wo Cm  Result Date: 07/03/2020 CLINICAL DATA:  Decreased oxygen saturation chest radiograph July 03, 2020; chest CT September 24, 2019 EXAM: CT ANGIOGRAPHY CHEST WITH CONTRAST TECHNIQUE: Multidetector CT imaging of the chest was performed using the standard protocol during bolus administration of intravenous contrast. Multiplanar CT image reconstructions and MIPs were obtained to evaluate the vascular anatomy. CONTRAST:  57m OMNIPAQUE IOHEXOL 350 MG/ML SOLN COMPARISON:  Chest radiograph July 03, 2020; chest CT September 24, 2019 FINDINGS: Cardiovascular: There is no demonstrable pulmonary embolus. There is no thoracic aortic aneurysm or dissection. There are scattered foci of calcification in proximal  visualized great vessels. There are foci of aortic atherosclerosis. There are occasional foci of coronary artery calcification. No pericardial effusion or pericardial thickening. Mediastinum/Nodes: Thyroid appears unremarkable. Several subcentimeter mediastinal lymph nodes present which do not meet size criteria for pathologic significance. No esophageal lesions are evident. Lungs/Pleura: There are pleural effusions bilaterally, larger on the right than on the left. There is consolidation in the right lower lobe with a lesser degree of atelectasis and consolidation left lower lobe. No pneumothorax. Small bullae in the apices. Trachea and major bronchial structures appear patent. Upper Abdomen: Gallbladder is absent. There is hepatic steatosis. Foci of upper abdominal aortic atherosclerosis noted. Musculoskeletal: Generalized anasarca noted with soft tissue edema throughout the chest. There is multifocal degenerative change in the thoracic spine. No blastic or lytic bone lesions. Review of the MIP images confirms the above findings. IMPRESSION: 1. No evident pulmonary embolus. No thoracic aortic aneurysm or dissection. There is aortic atherosclerosis as well as foci of great vessel and coronary artery calcification. 2. Pleural effusions bilaterally, larger on the right than the left. Atelectasis with consolidation in both lower lobes concerning for superimposed pneumonia, more severe on the right than the left. Mild bullous disease in the apices. 3. Generalized anasarca. Question a degree of underlying congestive heart failure given the anasarca with sizable pleural effusions. 4.  Hepatic steatosis. 5.  Gallbladder absent. 6.  No evident adenopathy. Aortic Atherosclerosis (ICD10-I70.0). Electronically Signed   By: WLowella GripIII M.D.   On: 07/03/2020 14:37   DG Chest Port 1 View  Result Date: 07/04/2020 CLINICAL DATA:  70year old female with bilateral pleural effusions and atelectasis versus pneumonia.  EXAM: PORTABLE CHEST 1 VIEW COMPARISON:  Chest CTA 07/03/2020 and earlier. FINDINGS: Portable AP semi upright view at 0429 hours. Right PICC line appears stable. Improved lung volumes from portable exam yesterday. Continued veiling opacity at the lung bases greater on the right. Mediastinal contours within normal limits. No pneumothorax. No pulmonary edema. No areas of worsening ventilation. IMPRESSION: Improved lung volumes since yesterday. Stable right greater than left pleural effusions with associated lower lobe atelectasis versus infection. No new cardiopulmonary abnormality. Electronically Signed   By: HGenevie AnnM.D.   On: 07/04/2020 06:42   ECHOCARDIOGRAM COMPLETE  Result Date: 07/04/2020    ECHOCARDIOGRAM REPORT   Patient Name:   DTODD GAMBELDate of Exam: 07/04/2020 Medical Rec #:  [id number]     Height:       63.0 in Accession #:    [id number]    Weight:  141.3 lb Date of Birth:  December 17, 1950     BSA:          1.668 m Patient Age:    69 years       BP:           107/54 mmHg Patient Gender: F              HR:           74 bpm. Exam Location:  [facility] Procedure: 2D Echo, Cardiac Doppler and Color Doppler Indications:    Dyspnea  History:        Patient has no prior history of Echocardiogram examinations.                 CHF, COPD, Signs/Symptoms:Shortness of Breath; Risk                 Factors:Hypertension and Diabetes. Pneumonia, pleural effusions.  Sonographer:    Dustin Flock RDCS Referring Phys: [place]  1. Left ventricular ejection fraction, by estimation, is 65 to 70%. The left ventricle has normal function. The left ventricle has no regional wall motion abnormalities. Left ventricular diastolic parameters are consistent with Grade I diastolic dysfunction (impaired relaxation).  2. Right ventricular systolic function is normal. The right ventricular size is normal. There is normal pulmonary artery systolic pressure. The estimated right ventricular systolic  pressure is [id number] mmHg.  3. The mitral valve is abnormal. Trivial mitral valve regurgitation.  4. The aortic valve is tricuspid. Aortic valve regurgitation is not visualized.  5. The inferior vena cava is dilated in size with >50% respiratory variability, suggesting right atrial pressure of 8 mmHg. Comparison(s): No prior Echocardiogram. FINDINGS  Left Ventricle: Left ventricular ejection fraction, by estimation, is 65 to 70%. The left ventricle has normal function. The left ventricle has no regional wall motion abnormalities. The left ventricular internal cavity size was normal in size. There is  no left ventricular hypertrophy. Left ventricular diastolic parameters are consistent with Grade I diastolic dysfunction (impaired relaxation). Indeterminate filling pressures. Right Ventricle: The right ventricular size is normal. No increase in right ventricular wall thickness. Right ventricular systolic function is normal. There is normal pulmonary artery systolic pressure. The tricuspid regurgitant velocity is 2.55 m/s, and  with an assumed right atrial pressure of 8 mmHg, the estimated right ventricular systolic pressure is [id number] mmHg. Left Atrium: Left atrial size was normal in size. Right Atrium: Right atrial size was normal in size. Pericardium: There is no evidence of pericardial effusion. Mitral Valve: The mitral valve is abnormal. There is mild thickening of the mitral valve leaflet(s). Mild mitral annular calcification. Trivial mitral valve regurgitation. Tricuspid Valve: The tricuspid valve is grossly normal. Tricuspid valve regurgitation is trivial. Aortic Valve: The aortic valve is tricuspid. Aortic valve regurgitation is not visualized. Pulmonic Valve: The pulmonic valve was normal in structure. Pulmonic valve regurgitation is not visualized. Aorta: The aortic root and ascending aorta are structurally normal, with no evidence of dilitation. Venous: The inferior vena cava is dilated in size with greater than  50% respiratory variability, suggesting right atrial pressure of 8 mmHg. IAS/Shunts: No atrial level shunt detected by color flow Doppler.  LEFT VENTRICLE PLAX 2D LVIDd:         4.21 cm  Diastology LVIDs:         2.56 cm  LV e' medial:    7.40 cm/s LV PW:         0.99 cm  LV  E/e' medial:  14.6 LV IVS:        0.94 cm  LV e' lateral:   6.42 cm/s LVOT diam:     1.90 cm  LV E/e' lateral: 16.8 LV SV:         87 LV SV Index:   52 LVOT Area:     2.84 cm  RIGHT VENTRICLE RV Basal diam:  2.85 cm RV S prime:     8.92 cm/s TAPSE (M-mode): 2.3 cm LEFT ATRIUM             Index       RIGHT ATRIUM           Index LA diam:        3.90 cm 2.34 cm/m  RA Area:     13.60 cm LA Vol (A2C):   46.8 ml 28.05 ml/m RA Volume:   33.30 ml  19.96 ml/m LA Vol (A4C):   28.8 ml 17.26 ml/m LA Biplane Vol: 36.6 ml 21.94 ml/m  AORTIC VALVE LVOT Vmax:   120.00 cm/s LVOT Vmean:  82.800 cm/s LVOT VTI:    0.306 m  AORTA Ao Root diam: 2.50 cm MITRAL VALVE                TRICUSPID VALVE MV Area (PHT): 3.15 cm     TR Peak grad:   26.0 mmHg MV Decel Time: 241 msec     TR Vmax:        255.00 cm/s MV E velocity: 108.00 cm/s MV A velocity: 94.60 cm/s   SHUNTS MV E/A ratio:  1.14         Systemic VTI:  0.31 m                             Systemic Diam: 1.90 cm Lyman Bishop MD Electronically signed by Lyman Bishop MD Signature Date/Time: 07/04/2020/12:43:03 PM    Final     Subjective:   Patient was seen and examined 07/06/2020, 10:16 AM Patient stable today. No acute distress.  No issues overnight Stable for discharge.  Discharge Exam:    Vitals:   07/06/20 0027 07/06/20 0435 07/06/20 0712 07/06/20 0804  BP: 118/64 119/71  115/60  Pulse: (!) 57 (!) 58  71  Resp: 20   20  Temp: 97.9 F (36.6 C) 97.7 F (36.5 C)  98.2 F (36.8 C)  TempSrc: Oral Oral  Oral  SpO2: 94% 94% 95% 94%  Weight:      Height:        General: Pt lying comfortably in bed & appears in no obvious distress. Cardiovascular: S1 & S2 heard, RRR, S1/S2 +. No murmurs,  rubs, gallops or clicks. No JVD or pedal edema. Respiratory: Clear to auscultation without wheezing, rhonchi or crackles. No increased work of breathing. Abdominal:  Non-distended, non-tender & soft. No organomegaly or masses appreciated. Normal bowel sounds heard. CNS: Alert and oriented. No focal deficits. Extremities: no edema, no cyanosis      The results of significant diagnostics from this hospitalization (including imaging, microbiology, ancillary and laboratory) are listed below for reference.      Microbiology:   Recent Results (from the past 240 hour(s))  Culture, blood (routine x 2)     Status: None (Preliminary result)   Collection Time: 07/03/20 12:16 PM   Specimen: BLOOD  Result Value Ref Range Status   Specimen Description BLOOD LEFT WRIST  Final   Special Requests  Final    BOTTLES DRAWN AEROBIC AND ANAEROBIC Blood Culture adequate volume   Culture   Final    NO GROWTH 3 DAYS Performed at [facility], [address], [place], [place] [address]    Report Status PENDING  Incomplete  Resp Panel by RT-PCR (Flu A&B, Covid) Nasopharyngeal Swab     Status: None   Collection Time: 07/03/20 12:16 PM   Specimen: Nasopharyngeal Swab; Nasopharyngeal(NP) swabs in vial transport medium  Result Value Ref Range Status   SARS Coronavirus 2 by RT PCR NEGATIVE NEGATIVE Final    Comment: (NOTE) SARS-CoV-2 target nucleic acids are NOT DETECTED.  The SARS-CoV-2 RNA is generally detectable in upper respiratory specimens during the acute phase of infection. The lowest concentration of SARS-CoV-2 viral copies this assay can detect is 138 copies/mL. A negative result does not preclude SARS-Cov-2 infection and should not be used as the sole basis for treatment or other patient management decisions. A negative result may occur with  improper specimen collection/handling, submission of specimen other than nasopharyngeal swab, presence of viral mutation(s) within the areas targeted by  this assay, and inadequate number of viral copies(<138 copies/mL). A negative result must be combined with clinical observations, patient history, and epidemiological information. The expected result is Negative.  Fact Sheet for Patients:  EntrepreneurPulse.com.au  Fact Sheet for Healthcare Providers:  IncredibleEmployment.be  This test is no t yet approved or cleared by the [place] FDA and  has been authorized for detection and/or diagnosis of SARS-CoV-2 by FDA under an Emergency Use Authorization (EUA). This EUA will remain  in effect (meaning this test can be used) for the duration of the COVID-19 declaration under Section 564(b)(1) of the Act, 21 U.S.C.section 360bbb-3(b)(1), unless the authorization is terminated  or revoked sooner.       Influenza A by PCR NEGATIVE NEGATIVE Final   Influenza B by PCR NEGATIVE NEGATIVE Final    Comment: (NOTE) The Xpert Xpress SARS-CoV-2/FLU/RSV plus assay is intended as an aid in the diagnosis of influenza from Nasopharyngeal swab specimens and should not be used as a sole basis for treatment. Nasal washings and aspirates are unacceptable for Xpert Xpress SARS-CoV-2/FLU/RSV testing.  Fact Sheet for Patients: EntrepreneurPulse.com.au  Fact Sheet for Healthcare Providers: IncredibleEmployment.be  This test is not yet approved or cleared by the [place] FDA and has been authorized for detection and/or diagnosis of SARS-CoV-2 by FDA under an Emergency Use Authorization (EUA). This EUA will remain in effect (meaning this test can be used) for the duration of the COVID-19 declaration under Section 564(b)(1) of the Act, 21 U.S.C. section 360bbb-3(b)(1), unless the authorization is terminated or revoked.  Performed at [facility], [address] [place], [place] [address]   Culture, blood (routine x 2)     Status: None (Preliminary result)   Collection Time:  07/03/20 12:19 PM   Specimen: BLOOD  Result Value Ref Range Status   Specimen Description BLOOD RIGHT ARM  Final   Special Requests   Final    BOTTLES DRAWN AEROBIC AND ANAEROBIC Blood Culture adequate volume   Culture   Final    NO GROWTH 3 DAYS Performed at [facility], [address] [place], [place] [address]    Report Status PENDING  Incomplete  MRSA PCR Screening     Status: None   Collection Time: 07/03/20  2:50 PM   Specimen: Nasal Mucosa; Nasopharyngeal  Result Value Ref Range Status   MRSA by PCR NEGATIVE NEGATIVE Final  Comment:        The GeneXpert MRSA Assay (FDA approved for NASAL specimens only), is one component of a comprehensive MRSA colonization surveillance program. It is not intended to diagnose MRSA infection nor to guide or monitor treatment for MRSA infections. Performed at [facility], [address] [place],  [address]      Labs:   CBC: Recent Labs  Lab 07/03/20 1215 07/04/20 0412 07/05/20 0343 07/06/20 0632  WBC 6.1 4.9 8.1 9.4  NEUTROABS 4.2  --   --   --   HGB 10.2* 8.9* 8.6* 8.9*  HCT 33.2* 28.8* 26.6* 27.5*  MCV 109.6* 109.1* 104.3* 105.8*  PLT 233 199 220 [id number]   Basic Metabolic Panel: Recent Labs  Lab 07/03/20 1215 07/04/20 0412 07/05/20 0343 07/06/20 0632  NA 138 139 139 141  K 4.1 3.6 3.1* 3.1*  CL 106 108 104 106  CO2 26 21* 27 30  GLUCOSE 125* 141* 111* 95  BUN 16 19 29* 32*  CREATININE 1.44* 1.49* 1.54* 1.51*  CALCIUM 9.1 8.9 8.7* 8.8*   Liver Function Tests: Recent Labs  Lab 07/03/20 1215 07/04/20 0412 07/05/20 0343  AST 12* 11* 9*  ALT '13 11 10  '$ ALKPHOS 56 48 45  BILITOT 0.5 0.6 0.6  PROT 6.1* 5.5* 5.4*  ALBUMIN 3.2* 2.8* 2.7*   BNP (last 3 results) Recent Labs    07/03/20 1215  BNP 453.0*   Cardiac Enzymes: No results for input(s): CKTOTAL, CKMB, CKMBINDEX, TROPONINI in the last 168 hours. CBG: Recent Labs  Lab 07/05/20 1632 07/05/20 1935 07/05/20 2041 07/06/20 0014  07/06/20 0732  GLUCAP 132* 163* 162* 120* 82   Hgb A1c No results for input(s): HGBA1C in the last 72 hours. Lipid Profile No results for input(s): CHOL, HDL, LDLCALC, TRIG, CHOLHDL, LDLDIRECT in the last 72 hours. Thyroid function studies No results for input(s): TSH, T4TOTAL, T3FREE, THYROIDAB in the last 72 hours.  Invalid input(s): FREET3 Anemia work up No results for input(s): VITAMINB12, FOLATE, FERRITIN, TIBC, IRON, RETICCTPCT in the last 72 hours. Urinalysis    Component Value Date/Time   COLORURINE YELLOW 07/03/2020 2109   APPEARANCEUR CLEAR 07/03/2020 2109   LABSPEC 1.016 07/03/2020 2109   PHURINE 5.0 07/03/2020 2109   GLUCOSEU NEGATIVE 07/03/2020 2109   HGBUR NEGATIVE 07/03/2020 2109   BILIRUBINUR NEGATIVE 07/03/2020 2109   KETONESUR NEGATIVE 07/03/2020 2109   PROTEINUR NEGATIVE 07/03/2020 2109   NITRITE NEGATIVE 07/03/2020 2109   LEUKOCYTESUR NEGATIVE 07/03/2020 2109         Time coordinating discharge: Over 45 minutes  SIGNED: Deatra James, MD, FACP, [facility]. [facility],  Please use [email or web address] to Page If 7PM-7AM, please contact night-coverage [email or web address] [facility] 07/06/2020, 10:16 AM

## 2020-07-06 NOTE — TOC Transition Note (Signed)
Transition of Care [facility]) - CM/SW Discharge Note   Patient Details  Name: Tara [name] MRN: [id number] Date of Birth: 1950-04-20  [facility] CM/SW Contact:  Boneta Lucks, RN Phone Number: 07/06/2020, 10:38 AM   Clinical Narrative:  Patient discharging home today. Active with [facility] health. New orders placed for RN/PT. Sarah updated. Patient has cane, walker, home oxygen. TOC received consult for CHF. TOC spoke with [facility] -daughter. Patient does not weigh daily or follow a restricted diet.  RN to give Living better CHF book. Daughter encourage to review book and ask questions at Cardiology appointment 5/17.    Final next level of care: [place] Barriers to Discharge: Barriers Resolved   Patient Goals and CMS Choice Patient states their goals for this hospitalization and ongoing recovery are:: to go home. CMS Medicare.gov Compare Post Acute Care list provided to:: Patient Represenative (must comment) Choice offered to / list presented to : Adult Children  Discharge Placement              Patient chooses bed at:  (Home) Patient to be transferred to facility by: Family Name of family member notified: [facility] Patient and family notified of of transfer: 07/06/20  Discharge Plan and Services     Readmission Risk Interventions Readmission Risk Prevention Plan 07/06/2020  Transportation Screening Complete  PCP or Specialist Appt within 5-7 Days Complete  Home Care Screening Complete  Medication Review (RN CM) Complete  Some recent data might be hidden

## 2020-07-07 DIAGNOSIS — K529 Noninfective gastroenteritis and colitis, unspecified: Secondary | ICD-10-CM | POA: Diagnosis not present

## 2020-07-07 DIAGNOSIS — J449 Chronic obstructive pulmonary disease, unspecified: Secondary | ICD-10-CM | POA: Diagnosis not present

## 2020-07-07 DIAGNOSIS — I509 Heart failure, unspecified: Secondary | ICD-10-CM | POA: Diagnosis not present

## 2020-07-07 DIAGNOSIS — R69 Illness, unspecified: Secondary | ICD-10-CM | POA: Diagnosis not present

## 2020-07-07 DIAGNOSIS — Z452 Encounter for adjustment and management of vascular access device: Secondary | ICD-10-CM | POA: Diagnosis not present

## 2020-07-07 DIAGNOSIS — R001 Bradycardia, unspecified: Secondary | ICD-10-CM | POA: Diagnosis not present

## 2020-07-07 DIAGNOSIS — Z5181 Encounter for therapeutic drug level monitoring: Secondary | ICD-10-CM | POA: Diagnosis not present

## 2020-07-07 DIAGNOSIS — I130 Hypertensive heart and chronic kidney disease with heart failure and stage 1 through stage 4 chronic kidney disease, or unspecified chronic kidney disease: Secondary | ICD-10-CM | POA: Diagnosis not present

## 2020-07-07 DIAGNOSIS — Z792 Long term (current) use of antibiotics: Secondary | ICD-10-CM | POA: Diagnosis not present

## 2020-07-07 DIAGNOSIS — N183 Chronic kidney disease, stage 3 unspecified: Secondary | ICD-10-CM | POA: Diagnosis not present

## 2020-07-08 DIAGNOSIS — R001 Bradycardia, unspecified: Secondary | ICD-10-CM | POA: Diagnosis not present

## 2020-07-08 DIAGNOSIS — Z452 Encounter for adjustment and management of vascular access device: Secondary | ICD-10-CM | POA: Diagnosis not present

## 2020-07-08 DIAGNOSIS — Z792 Long term (current) use of antibiotics: Secondary | ICD-10-CM | POA: Diagnosis not present

## 2020-07-08 DIAGNOSIS — I130 Hypertensive heart and chronic kidney disease with heart failure and stage 1 through stage 4 chronic kidney disease, or unspecified chronic kidney disease: Secondary | ICD-10-CM | POA: Diagnosis not present

## 2020-07-08 DIAGNOSIS — N183 Chronic kidney disease, stage 3 unspecified: Secondary | ICD-10-CM | POA: Diagnosis not present

## 2020-07-08 DIAGNOSIS — K529 Noninfective gastroenteritis and colitis, unspecified: Secondary | ICD-10-CM | POA: Diagnosis not present

## 2020-07-08 DIAGNOSIS — J449 Chronic obstructive pulmonary disease, unspecified: Secondary | ICD-10-CM | POA: Diagnosis not present

## 2020-07-08 DIAGNOSIS — R69 Illness, unspecified: Secondary | ICD-10-CM | POA: Diagnosis not present

## 2020-07-08 DIAGNOSIS — Z5181 Encounter for therapeutic drug level monitoring: Secondary | ICD-10-CM | POA: Diagnosis not present

## 2020-07-08 DIAGNOSIS — I509 Heart failure, unspecified: Secondary | ICD-10-CM | POA: Diagnosis not present

## 2020-07-08 LAB — CULTURE, BLOOD (ROUTINE X 2)
Culture: NO GROWTH
Culture: NO GROWTH
Special Requests: ADEQUATE
Special Requests: ADEQUATE

## 2020-07-09 DIAGNOSIS — Z5181 Encounter for therapeutic drug level monitoring: Secondary | ICD-10-CM | POA: Diagnosis not present

## 2020-07-09 DIAGNOSIS — N183 Chronic kidney disease, stage 3 unspecified: Secondary | ICD-10-CM | POA: Diagnosis not present

## 2020-07-09 DIAGNOSIS — Z792 Long term (current) use of antibiotics: Secondary | ICD-10-CM | POA: Diagnosis not present

## 2020-07-09 DIAGNOSIS — K52839 Microscopic colitis, unspecified: Secondary | ICD-10-CM | POA: Diagnosis not present

## 2020-07-09 DIAGNOSIS — I130 Hypertensive heart and chronic kidney disease with heart failure and stage 1 through stage 4 chronic kidney disease, or unspecified chronic kidney disease: Secondary | ICD-10-CM | POA: Diagnosis not present

## 2020-07-09 DIAGNOSIS — J449 Chronic obstructive pulmonary disease, unspecified: Secondary | ICD-10-CM | POA: Diagnosis not present

## 2020-07-09 DIAGNOSIS — R69 Illness, unspecified: Secondary | ICD-10-CM | POA: Diagnosis not present

## 2020-07-09 DIAGNOSIS — K529 Noninfective gastroenteritis and colitis, unspecified: Secondary | ICD-10-CM | POA: Diagnosis not present

## 2020-07-09 DIAGNOSIS — R001 Bradycardia, unspecified: Secondary | ICD-10-CM | POA: Diagnosis not present

## 2020-07-09 DIAGNOSIS — Z452 Encounter for adjustment and management of vascular access device: Secondary | ICD-10-CM | POA: Diagnosis not present

## 2020-07-09 DIAGNOSIS — I509 Heart failure, unspecified: Secondary | ICD-10-CM | POA: Diagnosis not present

## 2020-07-12 DIAGNOSIS — J449 Chronic obstructive pulmonary disease, unspecified: Secondary | ICD-10-CM | POA: Diagnosis not present

## 2020-07-13 DIAGNOSIS — N183 Chronic kidney disease, stage 3 unspecified: Secondary | ICD-10-CM | POA: Diagnosis not present

## 2020-07-13 DIAGNOSIS — K5712 Diverticulitis of small intestine without perforation or abscess without bleeding: Secondary | ICD-10-CM | POA: Diagnosis not present

## 2020-07-13 DIAGNOSIS — I130 Hypertensive heart and chronic kidney disease with heart failure and stage 1 through stage 4 chronic kidney disease, or unspecified chronic kidney disease: Secondary | ICD-10-CM | POA: Diagnosis not present

## 2020-07-13 DIAGNOSIS — R69 Illness, unspecified: Secondary | ICD-10-CM | POA: Diagnosis not present

## 2020-07-13 DIAGNOSIS — R001 Bradycardia, unspecified: Secondary | ICD-10-CM | POA: Diagnosis not present

## 2020-07-13 DIAGNOSIS — Z792 Long term (current) use of antibiotics: Secondary | ICD-10-CM | POA: Diagnosis not present

## 2020-07-13 DIAGNOSIS — K529 Noninfective gastroenteritis and colitis, unspecified: Secondary | ICD-10-CM | POA: Diagnosis not present

## 2020-07-13 DIAGNOSIS — Z452 Encounter for adjustment and management of vascular access device: Secondary | ICD-10-CM | POA: Diagnosis not present

## 2020-07-13 DIAGNOSIS — I509 Heart failure, unspecified: Secondary | ICD-10-CM | POA: Diagnosis not present

## 2020-07-13 DIAGNOSIS — Z6822 Body mass index (BMI) 22.0-22.9, adult: Secondary | ICD-10-CM | POA: Diagnosis not present

## 2020-07-13 DIAGNOSIS — J449 Chronic obstructive pulmonary disease, unspecified: Secondary | ICD-10-CM | POA: Diagnosis not present

## 2020-07-13 DIAGNOSIS — Z5181 Encounter for therapeutic drug level monitoring: Secondary | ICD-10-CM | POA: Diagnosis not present

## 2020-07-15 ENCOUNTER — Emergency Department (HOSPITAL_COMMUNITY): Payer: Medicare HMO

## 2020-07-15 ENCOUNTER — Emergency Department (HOSPITAL_COMMUNITY)
Admission: EM | Admit: 2020-07-15 | Discharge: 2020-07-15 | Disposition: A | Discharge status: Home / Self Care | Payer: Medicare HMO | Attending: Emergency Medicine | Admitting: Emergency Medicine

## 2020-07-15 ENCOUNTER — Encounter (HOSPITAL_COMMUNITY): Payer: Self-pay

## 2020-07-15 ENCOUNTER — Other Ambulatory Visit: Payer: Self-pay

## 2020-07-15 DIAGNOSIS — I130 Hypertensive heart and chronic kidney disease with heart failure and stage 1 through stage 4 chronic kidney disease, or unspecified chronic kidney disease: Secondary | ICD-10-CM | POA: Diagnosis not present

## 2020-07-15 DIAGNOSIS — Y92009 Unspecified place in unspecified non-institutional (private) residence as the place of occurrence of the external cause: Secondary | ICD-10-CM | POA: Insufficient documentation

## 2020-07-15 DIAGNOSIS — Z5181 Encounter for therapeutic drug level monitoring: Secondary | ICD-10-CM | POA: Diagnosis not present

## 2020-07-15 DIAGNOSIS — Z792 Long term (current) use of antibiotics: Secondary | ICD-10-CM | POA: Diagnosis not present

## 2020-07-15 DIAGNOSIS — I509 Heart failure, unspecified: Secondary | ICD-10-CM | POA: Diagnosis not present

## 2020-07-15 DIAGNOSIS — F17210 Nicotine dependence, cigarettes, uncomplicated: Secondary | ICD-10-CM | POA: Diagnosis not present

## 2020-07-15 DIAGNOSIS — J449 Chronic obstructive pulmonary disease, unspecified: Secondary | ICD-10-CM | POA: Diagnosis not present

## 2020-07-15 DIAGNOSIS — Z20822 Contact with and (suspected) exposure to covid-19: Secondary | ICD-10-CM | POA: Insufficient documentation

## 2020-07-15 DIAGNOSIS — W01198A Fall on same level from slipping, tripping and stumbling with subsequent striking against other object, initial encounter: Secondary | ICD-10-CM | POA: Diagnosis not present

## 2020-07-15 DIAGNOSIS — Z7901 Long term (current) use of anticoagulants: Secondary | ICD-10-CM | POA: Insufficient documentation

## 2020-07-15 DIAGNOSIS — I6782 Cerebral ischemia: Secondary | ICD-10-CM | POA: Diagnosis not present

## 2020-07-15 DIAGNOSIS — S0001XA Abrasion of scalp, initial encounter: Secondary | ICD-10-CM | POA: Insufficient documentation

## 2020-07-15 DIAGNOSIS — E119 Type 2 diabetes mellitus without complications: Secondary | ICD-10-CM | POA: Insufficient documentation

## 2020-07-15 DIAGNOSIS — R001 Bradycardia, unspecified: Secondary | ICD-10-CM | POA: Diagnosis not present

## 2020-07-15 DIAGNOSIS — Z79899 Other long term (current) drug therapy: Secondary | ICD-10-CM | POA: Insufficient documentation

## 2020-07-15 DIAGNOSIS — S0990XA Unspecified injury of head, initial encounter: Secondary | ICD-10-CM | POA: Diagnosis present

## 2020-07-15 DIAGNOSIS — R69 Illness, unspecified: Secondary | ICD-10-CM | POA: Diagnosis not present

## 2020-07-15 DIAGNOSIS — N183 Chronic kidney disease, stage 3 unspecified: Secondary | ICD-10-CM | POA: Diagnosis not present

## 2020-07-15 DIAGNOSIS — K52839 Microscopic colitis, unspecified: Secondary | ICD-10-CM | POA: Diagnosis not present

## 2020-07-15 DIAGNOSIS — I10 Essential (primary) hypertension: Secondary | ICD-10-CM | POA: Diagnosis not present

## 2020-07-15 DIAGNOSIS — W19XXXA Unspecified fall, initial encounter: Secondary | ICD-10-CM

## 2020-07-15 DIAGNOSIS — Z452 Encounter for adjustment and management of vascular access device: Secondary | ICD-10-CM | POA: Diagnosis not present

## 2020-07-15 DIAGNOSIS — K529 Noninfective gastroenteritis and colitis, unspecified: Secondary | ICD-10-CM | POA: Diagnosis not present

## 2020-07-15 DIAGNOSIS — I110 Hypertensive heart disease with heart failure: Secondary | ICD-10-CM | POA: Insufficient documentation

## 2020-07-15 DIAGNOSIS — R519 Headache, unspecified: Secondary | ICD-10-CM | POA: Diagnosis not present

## 2020-07-15 LAB — DIFFERENTIAL
Abs Immature Granulocytes: 0.02 10*3/uL (ref 0.00–0.07)
Basophils Absolute: 0 10*3/uL (ref 0.0–0.1)
Basophils Relative: 0 %
Eosinophils Absolute: 0.1 10*3/uL (ref 0.0–0.5)
Eosinophils Relative: 2 %
Immature Granulocytes: 0 %
Lymphocytes Relative: 21 %
Lymphs Abs: 1.2 10*3/uL (ref 0.7–4.0)
Monocytes Absolute: 0.6 10*3/uL (ref 0.1–1.0)
Monocytes Relative: 10 %
Neutro Abs: 3.9 10*3/uL (ref 1.7–7.7)
Neutrophils Relative %: 67 %

## 2020-07-15 LAB — RAPID URINE DRUG SCREEN, HOSP PERFORMED
Amphetamines: NOT DETECTED
Barbiturates: NOT DETECTED
Benzodiazepines: NOT DETECTED
Cocaine: NOT DETECTED
Opiates: NOT DETECTED
Tetrahydrocannabinol: NOT DETECTED

## 2020-07-15 LAB — I-STAT CHEM 8, ED
BUN: 17 mg/dL (ref 8–23)
Calcium, Ion: 1.31 mmol/L (ref 1.15–1.40)
Chloride: 98 mmol/L (ref 98–111)
Creatinine, Ser: 1.6 mg/dL — ABNORMAL HIGH (ref 0.44–1.00)
Glucose, Bld: 73 mg/dL (ref 70–99)
HCT: 27 % — ABNORMAL LOW (ref 36.0–46.0)
Hemoglobin: 9.2 g/dL — ABNORMAL LOW (ref 12.0–15.0)
Potassium: 3.5 mmol/L (ref 3.5–5.1)
Sodium: 139 mmol/L (ref 135–145)
TCO2: 32 mmol/L (ref 22–32)

## 2020-07-15 LAB — COMPREHENSIVE METABOLIC PANEL
ALT: 11 U/L (ref 0–44)
AST: 13 U/L — ABNORMAL LOW (ref 15–41)
Albumin: 3 g/dL — ABNORMAL LOW (ref 3.5–5.0)
Alkaline Phosphatase: 59 U/L (ref 38–126)
Anion gap: 6 (ref 5–15)
BUN: 18 mg/dL (ref 8–23)
CO2: 32 mmol/L (ref 22–32)
Calcium: 9.3 mg/dL (ref 8.9–10.3)
Chloride: 99 mmol/L (ref 98–111)
Creatinine, Ser: 1.51 mg/dL — ABNORMAL HIGH (ref 0.44–1.00)
GFR, Estimated: 37 mL/min — ABNORMAL LOW (ref >60)
Glucose, Bld: 77 mg/dL (ref 70–99)
Potassium: 3.4 mmol/L — ABNORMAL LOW (ref 3.5–5.1)
Sodium: 137 mmol/L (ref 135–145)
Total Bilirubin: 1.3 mg/dL — ABNORMAL HIGH (ref 0.3–1.2)
Total Protein: 5.9 g/dL — ABNORMAL LOW (ref 6.5–8.1)

## 2020-07-15 LAB — URINALYSIS, ROUTINE W REFLEX MICROSCOPIC
Bacteria, UA: NONE SEEN
Bilirubin Urine: NEGATIVE
Glucose, UA: NEGATIVE mg/dL
Ketones, ur: NEGATIVE mg/dL
Leukocytes,Ua: NEGATIVE
Nitrite: NEGATIVE
Protein, ur: NEGATIVE mg/dL
Specific Gravity, Urine: 1.009 (ref 1.005–1.030)
pH: 9 — ABNORMAL HIGH (ref 5.0–8.0)

## 2020-07-15 LAB — RESP PANEL BY RT-PCR (FLU A&B, COVID) ARPGX2
Influenza A by PCR: NEGATIVE
Influenza B by PCR: NEGATIVE
SARS Coronavirus 2 by RT PCR: NEGATIVE

## 2020-07-15 LAB — CBC
HCT: 30.3 % — ABNORMAL LOW (ref 36.0–46.0)
Hemoglobin: 9.7 g/dL — ABNORMAL LOW (ref 12.0–15.0)
MCH: 34.4 pg — ABNORMAL HIGH (ref 26.0–34.0)
MCHC: 32 g/dL (ref 30.0–36.0)
MCV: 107.4 fL — ABNORMAL HIGH (ref 80.0–100.0)
Platelets: 148 10*3/uL — ABNORMAL LOW (ref 150–400)
RBC: 2.82 MIL/uL — ABNORMAL LOW (ref 3.87–5.11)
RDW: 14.3 % (ref 11.5–15.5)
WBC: 5.9 10*3/uL (ref 4.0–10.5)
nRBC: 0 % (ref 0.0–0.2)

## 2020-07-15 LAB — PROTIME-INR
INR: 1.6 — ABNORMAL HIGH (ref 0.8–1.2)
Prothrombin Time: 19.1 seconds — ABNORMAL HIGH (ref 11.4–15.2)

## 2020-07-15 LAB — D-DIMER, QUANTITATIVE: D-Dimer, Quant: 1.65 ug/mL-FEU — ABNORMAL HIGH (ref 0.00–0.50)

## 2020-07-15 LAB — ETHANOL: Alcohol, Ethyl (B): <10 mg/dL (ref <10)

## 2020-07-15 LAB — APTT: aPTT: 37 seconds — ABNORMAL HIGH (ref 24–36)

## 2020-07-15 NOTE — ED Notes (Signed)
Family got pt a snack.

## 2020-07-15 NOTE — Discharge Instructions (Addendum)
Please refer to the attached instructions. Follow-up with your providers as scheduled.

## 2020-07-15 NOTE — ED Provider Notes (Signed)
[facility] EMERGENCY DEPARTMENT Provider Note   CSN: [id number] Arrival date & time: 07/15/20  1143     History Chief Complaint  Patient presents with  . Fall         Tara [name] is a 70 y.o. female.  Patient reports to ED after mechanical fall at home earlier today. Patient states she fell backwards, hitting her head on the bed frame. She suffered a scalp wound. She did not lose consciousness, but states she felt light-headed and dizzy after seeing the blood. Patient is on eliquis. History of atrial fibrillation, CHF, COPD. She is on home oxygen. Oxygen saturations currently 100%. Patient denies chest pain, shortness of breath.   The history is provided by the patient and medical records. No language interpreter was used.  Fall This is a new problem. The current episode started 3 to 5 hours ago. Pertinent negatives include no chest pain, no abdominal pain and no shortness of breath.  Head Laceration This is a new problem. The current episode started 3 to 5 hours ago. Pertinent negatives include no chest pain, no abdominal pain and no shortness of breath.       Past Medical History:  Diagnosis Date  . CHF (congestive heart failure) ([place])   . COPD (chronic obstructive pulmonary disease) ([place])   . Diabetes mellitus without complication ([place])   . Hypertension     Patient Active Problem List   Diagnosis Date Noted  . Acute on chronic respiratory failure ([place]) 07/03/2020  . Aspiration pneumonia ([place])   . Acute respiratory failure with hypoxia and hypercapnia ([facility])   . COPD exacerbation ([place])   . Pleural effusion     Past Surgical History:  Procedure Laterality Date  . ABDOMINAL HYSTERECTOMY    . APPENDECTOMY    . BACK SURGERY    . CHOLECYSTECTOMY       OB History   No obstetric history on file.     History reviewed. No pertinent family history.  Social History   Tobacco Use  . Smoking status: Current Some Day Smoker    Packs/day: 0.50    Types:  Cigarettes  . Smokeless tobacco: Never Used  Vaping Use  . Vaping Use: Never used  Substance Use Topics  . Alcohol use: Not Currently  . Drug use: Never    Home Medications Prior to Admission medications   Medication Sig Start Date End Date Taking? Authorizing Provider  acetaZOLAMIDE (DIAMOX) 250 MG tablet Take 250 mg by mouth daily. 04/15/20   [provider]  atorvastatin (LIPITOR) 20 MG tablet Take 20 mg by mouth daily.    [provider]  diltiazem (CARDIZEM SR) 120 MG 12 hr capsule Take 120 mg by mouth daily.    [provider]  ELIQUIS 5 MG TABS tablet Take 1 tablet by mouth 2 (two) times daily. 04/15/20   [provider]  ertapenem (INVANZ) IVPB Inject 1 g into the vein daily. 07/01/20 07/16/20  [provider]  EUTHYROX 50 MCG tablet Take 50 mcg by mouth daily. 07/01/20   [provider]  furosemide (LASIX) 20 MG tablet Take 1 tablet (20 mg total) by mouth daily. 07/06/20 07/06/21  Shahmehdi, Valeria Batman, MD  levalbuterol Penne Lash) 0.63 MG/3ML nebulizer solution Take 0.63 mg by nebulization every 4 (four) hours as needed for wheezing or shortness of breath.    [provider]  LORazepam (ATIVAN) 0.5 MG tablet Take 0.5 mg by mouth 2 (two) times daily. 05/11/20   [provider]  methylPREDNISolone (MEDROL DOSEPAK) 4 MG TBPK tablet Medrol Dosepak take as instructed. Make skip higher dose start at 40 mg, follow instruction per Dosepak for tapering down 07/06/20   Shahmehdi, Valeria Batman, MD  Omega-3 Fatty Acids (FISH OIL) 1000 MG CAPS Take 1 capsule by mouth 3 (three) times daily.    [provider]  pantoprazole (PROTONIX) 40 MG tablet Take 1 tablet by mouth daily. 04/21/20   [provider]  tiZANidine (ZANAFLEX) 4 MG tablet Take 4 mg by mouth at bedtime.    [provider]    Allergies    Codeine  Review of Systems   Review of Systems  Respiratory: Negative for shortness of breath.   Cardiovascular:  Negative for chest pain.  Gastrointestinal: Negative for abdominal pain.  Skin: Positive for wound.  All other systems reviewed and are negative.   Physical Exam Updated Vital Signs BP (!) 103/58 (BP Location: Left Arm)   Pulse 68   Temp 98.6 F (37 C) (Oral)   Resp 16   Ht 5' (1.524 m)   Wt 62.6 kg   SpO2 100%   BMI 26.95 kg/m   Physical Exam HENT:     Head:     Comments: Old bruising noted to soft tissue of right orbit. Patient states she fell several days ago at home.  Superficial wound to occipital scalp.    Nose: Nose normal.     Mouth/Throat:     Mouth: Mucous membranes are moist.  Eyes:     Extraocular Movements: Extraocular movements intact.     Conjunctiva/sclera: Conjunctivae normal.  Cardiovascular:     Rate and Rhythm: Normal rate.  Pulmonary:     Effort: Pulmonary effort is normal.  Abdominal:     Palpations: Abdomen is soft.  Musculoskeletal:        General: Normal range of motion.     Cervical back: Normal range of motion and neck supple.  Skin:    General: Skin is warm and dry.  Neurological:     Mental Status: She is alert and oriented to person, place, and time.     GCS: GCS eye subscore is 4. GCS verbal subscore is 5. GCS motor subscore is 6.     Cranial Nerves: Cranial nerves are intact. No cranial nerve deficit.     Sensory: No sensory deficit.     Motor: Motor function is intact.     Coordination: Coordination normal.  Psychiatric:        Mood and Affect: Mood normal.        Behavior: Behavior normal.     ED Results / Procedures / Treatments   Labs (all labs ordered are listed, but only abnormal results are displayed) Labs Reviewed - No data to display  EKG EKG Interpretation  Date/Time:  Wednesday Jul 15 2020 15:23:40 EDT Ventricular Rate:  50 PR Interval:  151 QRS Duration: 109 QT Interval:  426 QTC Calculation: 389 R Axis:   87 Text Interpretation: Sinus rhythm Borderline right axis deviation Minimal ST elevation, anterior  leads Baseline wander in lead(s) V6 Since last tracing rate slower Otherwise normal ECG Confirmed by Daleen Bo [phone]) on 07/15/2020 3:40:33 PM   Radiology CT Head Wo Contrast  Result Date: 07/15/2020 CLINICAL DATA:  Pain following fall EXAM: CT HEAD WITHOUT CONTRAST TECHNIQUE: Contiguous axial images were obtained from the base of the skull through the vertex without intravenous contrast. COMPARISON:  None. FINDINGS: Brain: Ventricles and sulci are normal in  size and configuration. There is no appreciable intracranial mass, hemorrhage, extra-axial fluid collection, or midline shift. There is decreased attenuation throughout much of the dentate nucleus on the right compared to the left, raising concern for potential recent infarct involving the dentate nucleus of the right cerebellum. Elsewhere brain parenchyma appears unremarkable. Vascular: No hyperdense vessels. No appreciable vascular calcification. Skull: Bony calvarium a appears intact. Sinuses/Orbits: Paranasal sinuses are clear. Orbits appear symmetric bilaterally. Other: Mastoid air cells are clear. IMPRESSION: Decreased attenuation in the dentate nucleus on the right compared to the left. Recent and potentially acute infarct in this area must be of concern given this appearance. Elsewhere brain parenchyma appears unremarkable. No mass or hemorrhage. Electronically Signed   By: Lowella Grip III M.D.   On: 07/15/2020 16:10    Procedures Procedures   Medications Ordered in ED Medications - No data to display  ED Course  I have reviewed the triage vital signs and the nursing notes.  Pertinent labs & imaging results that were available during my care of the patient were reviewed by me and considered in my medical decision making (see chart for details).   Radiology results reviewed. Findings on CT concerning for recent and potentially acute infarct in the dentate nucleus of the right cerebellum. MRI ordered and stroke workup  initiated.  CLINICAL DATA:  Possible stroke on CT  EXAM: MRI HEAD WITHOUT CONTRAST  TECHNIQUE: Multiplanar, multiecho pulse sequences of the brain and surrounding structures were obtained without intravenous contrast.  COMPARISON:  None.  FINDINGS: Brain: There is no acute infarction or intracranial hemorrhage. There is no intracranial mass, mass effect, or edema. There is no hydrocephalus or extra-axial fluid collection. Ventricles and sulci are within normal limits in size and configuration. Patchy foci of T2 hyperintensity in the supratentorial white matter are nonspecific but probably reflect mild chronic microvascular ischemic changes.  Vascular: Major vessel flow voids at the skull base are preserved.  Skull and upper cervical spine: Normal marrow signal is preserved.  Sinuses/Orbits: Paranasal sinuses are aerated. Orbits are unremarkable.  Other: Sella is unremarkable.  Mastoid air cells are clear.  IMPRESSION: No acute infarction, hemorrhage, or mass. Finding on CT is artifact.  Mild chronic microvascular ischemic changes.   Electronically Signed   By: Macy Mis M.D.   On: 07/15/2020 18:25  Results for orders placed or performed during the hospital encounter of 07/15/20  Resp Panel by RT-PCR (Flu A&B, Covid) Nasopharyngeal Swab   Specimen: Nasopharyngeal Swab; Nasopharyngeal(NP) swabs in vial transport medium  Result Value Ref Range   SARS Coronavirus 2 by RT PCR NEGATIVE NEGATIVE   Influenza A by PCR NEGATIVE NEGATIVE   Influenza B by PCR NEGATIVE NEGATIVE  Ethanol  Result Value Ref Range   Alcohol, Ethyl (B) <10 <10 mg/dL  Protime-INR  Result Value Ref Range   Prothrombin Time 19.1 (H) 11.4 - 15.2 seconds   INR 1.6 (H) 0.8 - 1.2  APTT  Result Value Ref Range   aPTT 37 (H) 24 - 36 seconds  CBC  Result Value Ref Range   WBC 5.9 4.0 - 10.5 K/uL   RBC 2.82 (L) 3.87 - 5.11 MIL/uL   Hemoglobin 9.7 (L) 12.0 - 15.0 g/dL   HCT 30.3 (L)  36.0 - 46.0 %   MCV 107.4 (H) 80.0 - 100.0 fL   MCH 34.4 (H) 26.0 - 34.0 pg   MCHC 32.0 30.0 - 36.0 g/dL   RDW 14.3 11.5 - 15.5 %   Platelets 148 (  L) 150 - 400 K/uL   nRBC 0.0 0.0 - 0.2 %  Differential  Result Value Ref Range   Neutrophils Relative % 67 %   Neutro Abs 3.9 1.7 - 7.7 K/uL   Lymphocytes Relative 21 %   Lymphs Abs 1.2 0.7 - 4.0 K/uL   Monocytes Relative 10 %   Monocytes Absolute 0.6 0.1 - 1.0 K/uL   Eosinophils Relative 2 %   Eosinophils Absolute 0.1 0.0 - 0.5 K/uL   Basophils Relative 0 %   Basophils Absolute 0.0 0.0 - 0.1 K/uL   Immature Granulocytes 0 %   Abs Immature Granulocytes 0.02 0.00 - 0.07 K/uL  Comprehensive metabolic panel  Result Value Ref Range   Sodium 137 135 - 145 mmol/L   Potassium 3.4 (L) 3.5 - 5.1 mmol/L   Chloride 99 98 - 111 mmol/L   CO2 32 22 - 32 mmol/L   Glucose, Bld 77 70 - 99 mg/dL   BUN 18 8 - 23 mg/dL   Creatinine, Ser 1.51 (H) 0.44 - 1.00 mg/dL   Calcium 9.3 8.9 - 10.3 mg/dL   Total Protein 5.9 (L) 6.5 - 8.1 g/dL   Albumin 3.0 (L) 3.5 - 5.0 g/dL   AST 13 (L) 15 - 41 U/L   ALT 11 0 - 44 U/L   Alkaline Phosphatase 59 38 - 126 U/L   Total Bilirubin 1.3 (H) 0.3 - 1.2 mg/dL   GFR, Estimated 37 (L) >60 mL/min   Anion gap 6 5 - 15  Urine rapid drug screen (hosp performed)  Result Value Ref Range   Opiates NONE DETECTED NONE DETECTED   Cocaine NONE DETECTED NONE DETECTED   Benzodiazepines NONE DETECTED NONE DETECTED   Amphetamines NONE DETECTED NONE DETECTED   Tetrahydrocannabinol NONE DETECTED NONE DETECTED   Barbiturates NONE DETECTED NONE DETECTED  Urinalysis, Routine w reflex microscopic  Result Value Ref Range   Color, Urine YELLOW YELLOW   APPearance CLEAR CLEAR   Specific Gravity, Urine 1.009 1.005 - 1.030   pH 9.0 (H) 5.0 - 8.0   Glucose, UA NEGATIVE NEGATIVE mg/dL   Hgb urine dipstick SMALL (A) NEGATIVE   Bilirubin Urine NEGATIVE NEGATIVE   Ketones, ur NEGATIVE NEGATIVE mg/dL   Protein, ur NEGATIVE NEGATIVE mg/dL    Nitrite NEGATIVE NEGATIVE   Leukocytes,Ua NEGATIVE NEGATIVE   RBC / HPF 0-5 0 - 5 RBC/hpf   WBC, UA 0-5 0 - 5 WBC/hpf   Bacteria, UA NONE SEEN NONE SEEN   Squamous Epithelial / LPF 0-5 0 - 5  D-dimer, quantitative  Result Value Ref Range   D-Dimer, Quant 1.65 (H) 0.00 - 0.50 ug/mL-FEU  I-stat chem 8, ED  Result Value Ref Range   Sodium 139 135 - 145 mmol/L   Potassium 3.5 3.5 - 5.1 mmol/L   Chloride 98 98 - 111 mmol/L   BUN 17 8 - 23 mg/dL   Creatinine, Ser 1.60 (H) 0.44 - 1.00 mg/dL   Glucose, Bld 73 70 - 99 mg/dL   Calcium, Ion 1.31 1.15 - 1.40 mmol/L   TCO2 32 22 - 32 mmol/L   Hemoglobin 9.2 (L) 12.0 - 15.0 g/dL   HCT 27.0 (L) 36.0 - 46.0 %     CT Head Wo Contrast  Result Date: 07/15/2020 CLINICAL DATA:  Pain following fall EXAM: CT HEAD WITHOUT CONTRAST TECHNIQUE: Contiguous axial images were obtained from the base of the skull through the vertex without intravenous contrast. COMPARISON:  None. FINDINGS: Brain: Ventricles and sulci are  normal in size and configuration. There is no appreciable intracranial mass, hemorrhage, extra-axial fluid collection, or midline shift. There is decreased attenuation throughout much of the dentate nucleus on the right compared to the left, raising concern for potential recent infarct involving the dentate nucleus of the right cerebellum. Elsewhere brain parenchyma appears unremarkable. Vascular: No hyperdense vessels. No appreciable vascular calcification. Skull: Bony calvarium a appears intact. Sinuses/Orbits: Paranasal sinuses are clear. Orbits appear symmetric bilaterally. Other: Mastoid air cells are clear. IMPRESSION: Decreased attenuation in the dentate nucleus on the right compared to the left. Recent and potentially acute infarct in this area must be of concern given this appearance. Elsewhere brain parenchyma appears unremarkable. No mass or hemorrhage. Electronically Signed   By: Lowella Grip III M.D.   On: 07/15/2020 16:10     MR  BRAIN WO CONTRAST  Result Date: 07/15/2020 CLINICAL DATA:  Possible stroke on CT EXAM: MRI HEAD WITHOUT CONTRAST TECHNIQUE: Multiplanar, multiecho pulse sequences of the brain and surrounding structures were obtained without intravenous contrast. COMPARISON:  None. FINDINGS: Brain: There is no acute infarction or intracranial hemorrhage. There is no intracranial mass, mass effect, or edema. There is no hydrocephalus or extra-axial fluid collection. Ventricles and sulci are within normal limits in size and configuration. Patchy foci of T2 hyperintensity in the supratentorial white matter are nonspecific but probably reflect mild chronic microvascular ischemic changes. Vascular: Major vessel flow voids at the skull base are preserved. Skull and upper cervical spine: Normal marrow signal is preserved. Sinuses/Orbits: Paranasal sinuses are aerated. Orbits are unremarkable. Other: Sella is unremarkable.  Mastoid air cells are clear. IMPRESSION: No acute infarction, hemorrhage, or mass. Finding on CT is artifact. Mild chronic microvascular ischemic changes. Electronically Signed   By: Macy Mis M.D.   On: 07/15/2020 18:25   Labs and radiology results reviewed. CT finding of stroke reported as artifact on MRI. She did not exhibit any signs/symptoms of stroke.  Patient is somewhat deconditioned. She is mildly orthostatic when standing. Occasional bradycardia.  She has adequate family support at home.     MDM Rules/Calculators/A&P                          Patient presented for evaluation of injury from a fall. Patient with superficial abrasion to occipital scalp. Wound care provided. CT evaluation of head obtained as patient is on eliquis. CT findings that were suspicious for stroke are noted to be artifactual on MRI evaluation. Patient is somewhat deconditioned due to recent hospitalizations for COPD exacerbation, pneumonia, CHF, and diverticulitis. Final Clinical Impression(s) / ED Diagnoses Final  diagnoses:  Fall, initial encounter  Abrasion of scalp, initial encounter    Rx / DC Orders ED Discharge Orders    None       Etta Quill, NP 07/15/20 [id number]    Milton Ferguson, MD 07/16/20 [phone]

## 2020-07-15 NOTE — ED Notes (Signed)
Pt reports she "got wrapped up"  In something and fell at home striking the back of her head.  CLeaned wound for better visibility.  PA assessed wound.

## 2020-07-15 NOTE — ED Triage Notes (Signed)
Pt to er, pt states that she was dumping her trash and fell forward hitting head, states that she may have passed out.  Pt oriented to person, place and time.

## 2020-07-15 NOTE — ED Provider Notes (Signed)
  Face-to-face evaluation   History: Patient presents for evaluation of injury from fall.  She accidentally hit her head on the bed frame when she fell backwards.  She is not sure why she fell but may have lost her balance.  She complains only of injury to posterior head.  She denies neck pain, back pain, focal weakness or paresthesia.  Physical exam: Alert elderly patient.  Scalp with abrasion posteriorly.  No active bleeding.  No facial crepitation or deformity.  No deformity of the large joints of the arms and legs.  Medical screening examination/treatment/procedure(s) were conducted as a shared visit with non-physician practitioner(s) and myself.  I personally evaluated the patient during the encounter    Daleen Bo, MD 07/17/20 1252

## 2020-07-16 DIAGNOSIS — N261 Atrophy of kidney (terminal): Secondary | ICD-10-CM | POA: Diagnosis not present

## 2020-07-16 DIAGNOSIS — K6389 Other specified diseases of intestine: Secondary | ICD-10-CM | POA: Diagnosis not present

## 2020-07-16 DIAGNOSIS — Z5181 Encounter for therapeutic drug level monitoring: Secondary | ICD-10-CM | POA: Diagnosis not present

## 2020-07-16 DIAGNOSIS — N281 Cyst of kidney, acquired: Secondary | ICD-10-CM | POA: Diagnosis not present

## 2020-07-16 DIAGNOSIS — R69 Illness, unspecified: Secondary | ICD-10-CM | POA: Diagnosis not present

## 2020-07-16 DIAGNOSIS — I509 Heart failure, unspecified: Secondary | ICD-10-CM | POA: Diagnosis not present

## 2020-07-16 DIAGNOSIS — R001 Bradycardia, unspecified: Secondary | ICD-10-CM | POA: Diagnosis not present

## 2020-07-16 DIAGNOSIS — N183 Chronic kidney disease, stage 3 unspecified: Secondary | ICD-10-CM | POA: Diagnosis not present

## 2020-07-16 DIAGNOSIS — J449 Chronic obstructive pulmonary disease, unspecified: Secondary | ICD-10-CM | POA: Diagnosis not present

## 2020-07-16 DIAGNOSIS — K529 Noninfective gastroenteritis and colitis, unspecified: Secondary | ICD-10-CM | POA: Diagnosis not present

## 2020-07-16 DIAGNOSIS — I130 Hypertensive heart and chronic kidney disease with heart failure and stage 1 through stage 4 chronic kidney disease, or unspecified chronic kidney disease: Secondary | ICD-10-CM | POA: Diagnosis not present

## 2020-07-16 DIAGNOSIS — Z792 Long term (current) use of antibiotics: Secondary | ICD-10-CM | POA: Diagnosis not present

## 2020-07-16 DIAGNOSIS — Z95828 Presence of other vascular implants and grafts: Secondary | ICD-10-CM | POA: Diagnosis not present

## 2020-07-16 DIAGNOSIS — Z452 Encounter for adjustment and management of vascular access device: Secondary | ICD-10-CM | POA: Diagnosis not present

## 2020-07-17 DIAGNOSIS — K529 Noninfective gastroenteritis and colitis, unspecified: Secondary | ICD-10-CM | POA: Diagnosis not present

## 2020-07-17 DIAGNOSIS — R69 Illness, unspecified: Secondary | ICD-10-CM | POA: Diagnosis not present

## 2020-07-17 DIAGNOSIS — Z5181 Encounter for therapeutic drug level monitoring: Secondary | ICD-10-CM | POA: Diagnosis not present

## 2020-07-17 DIAGNOSIS — Z452 Encounter for adjustment and management of vascular access device: Secondary | ICD-10-CM | POA: Diagnosis not present

## 2020-07-17 DIAGNOSIS — Z792 Long term (current) use of antibiotics: Secondary | ICD-10-CM | POA: Diagnosis not present

## 2020-07-17 DIAGNOSIS — I130 Hypertensive heart and chronic kidney disease with heart failure and stage 1 through stage 4 chronic kidney disease, or unspecified chronic kidney disease: Secondary | ICD-10-CM | POA: Diagnosis not present

## 2020-07-17 DIAGNOSIS — J449 Chronic obstructive pulmonary disease, unspecified: Secondary | ICD-10-CM | POA: Diagnosis not present

## 2020-07-17 DIAGNOSIS — N183 Chronic kidney disease, stage 3 unspecified: Secondary | ICD-10-CM | POA: Diagnosis not present

## 2020-07-17 DIAGNOSIS — R001 Bradycardia, unspecified: Secondary | ICD-10-CM | POA: Diagnosis not present

## 2020-07-17 DIAGNOSIS — I509 Heart failure, unspecified: Secondary | ICD-10-CM | POA: Diagnosis not present

## 2020-07-20 DIAGNOSIS — R001 Bradycardia, unspecified: Secondary | ICD-10-CM | POA: Diagnosis not present

## 2020-07-20 DIAGNOSIS — I130 Hypertensive heart and chronic kidney disease with heart failure and stage 1 through stage 4 chronic kidney disease, or unspecified chronic kidney disease: Secondary | ICD-10-CM | POA: Diagnosis not present

## 2020-07-20 DIAGNOSIS — K52839 Microscopic colitis, unspecified: Secondary | ICD-10-CM | POA: Diagnosis not present

## 2020-07-20 DIAGNOSIS — Z792 Long term (current) use of antibiotics: Secondary | ICD-10-CM | POA: Diagnosis not present

## 2020-07-20 DIAGNOSIS — N183 Chronic kidney disease, stage 3 unspecified: Secondary | ICD-10-CM | POA: Diagnosis not present

## 2020-07-20 DIAGNOSIS — R69 Illness, unspecified: Secondary | ICD-10-CM | POA: Diagnosis not present

## 2020-07-20 DIAGNOSIS — I509 Heart failure, unspecified: Secondary | ICD-10-CM | POA: Diagnosis not present

## 2020-07-20 DIAGNOSIS — J449 Chronic obstructive pulmonary disease, unspecified: Secondary | ICD-10-CM | POA: Diagnosis not present

## 2020-07-20 DIAGNOSIS — Z5181 Encounter for therapeutic drug level monitoring: Secondary | ICD-10-CM | POA: Diagnosis not present

## 2020-07-20 DIAGNOSIS — K529 Noninfective gastroenteritis and colitis, unspecified: Secondary | ICD-10-CM | POA: Diagnosis not present

## 2020-07-20 DIAGNOSIS — Z452 Encounter for adjustment and management of vascular access device: Secondary | ICD-10-CM | POA: Diagnosis not present

## 2020-07-21 DIAGNOSIS — J431 Panlobular emphysema: Secondary | ICD-10-CM | POA: Insufficient documentation

## 2020-07-21 DIAGNOSIS — I10 Essential (primary) hypertension: Secondary | ICD-10-CM | POA: Insufficient documentation

## 2020-07-21 DIAGNOSIS — F17210 Nicotine dependence, cigarettes, uncomplicated: Secondary | ICD-10-CM | POA: Insufficient documentation

## 2020-07-21 DIAGNOSIS — I480 Paroxysmal atrial fibrillation: Secondary | ICD-10-CM | POA: Diagnosis not present

## 2020-07-21 DIAGNOSIS — E785 Hyperlipidemia, unspecified: Secondary | ICD-10-CM | POA: Diagnosis not present

## 2020-07-22 DIAGNOSIS — Z5181 Encounter for therapeutic drug level monitoring: Secondary | ICD-10-CM | POA: Diagnosis not present

## 2020-07-22 DIAGNOSIS — I509 Heart failure, unspecified: Secondary | ICD-10-CM | POA: Diagnosis not present

## 2020-07-22 DIAGNOSIS — Z452 Encounter for adjustment and management of vascular access device: Secondary | ICD-10-CM | POA: Diagnosis not present

## 2020-07-22 DIAGNOSIS — Z792 Long term (current) use of antibiotics: Secondary | ICD-10-CM | POA: Diagnosis not present

## 2020-07-22 DIAGNOSIS — I130 Hypertensive heart and chronic kidney disease with heart failure and stage 1 through stage 4 chronic kidney disease, or unspecified chronic kidney disease: Secondary | ICD-10-CM | POA: Diagnosis not present

## 2020-07-22 DIAGNOSIS — J449 Chronic obstructive pulmonary disease, unspecified: Secondary | ICD-10-CM | POA: Diagnosis not present

## 2020-07-22 DIAGNOSIS — R001 Bradycardia, unspecified: Secondary | ICD-10-CM | POA: Diagnosis not present

## 2020-07-22 DIAGNOSIS — R69 Illness, unspecified: Secondary | ICD-10-CM | POA: Diagnosis not present

## 2020-07-22 DIAGNOSIS — K529 Noninfective gastroenteritis and colitis, unspecified: Secondary | ICD-10-CM | POA: Diagnosis not present

## 2020-07-22 DIAGNOSIS — N183 Chronic kidney disease, stage 3 unspecified: Secondary | ICD-10-CM | POA: Diagnosis not present

## 2020-07-23 DIAGNOSIS — Z792 Long term (current) use of antibiotics: Secondary | ICD-10-CM | POA: Diagnosis not present

## 2020-07-23 DIAGNOSIS — N183 Chronic kidney disease, stage 3 unspecified: Secondary | ICD-10-CM | POA: Diagnosis not present

## 2020-07-23 DIAGNOSIS — Z5181 Encounter for therapeutic drug level monitoring: Secondary | ICD-10-CM | POA: Diagnosis not present

## 2020-07-23 DIAGNOSIS — R001 Bradycardia, unspecified: Secondary | ICD-10-CM | POA: Diagnosis not present

## 2020-07-23 DIAGNOSIS — K529 Noninfective gastroenteritis and colitis, unspecified: Secondary | ICD-10-CM | POA: Diagnosis not present

## 2020-07-23 DIAGNOSIS — Z452 Encounter for adjustment and management of vascular access device: Secondary | ICD-10-CM | POA: Diagnosis not present

## 2020-07-23 DIAGNOSIS — I130 Hypertensive heart and chronic kidney disease with heart failure and stage 1 through stage 4 chronic kidney disease, or unspecified chronic kidney disease: Secondary | ICD-10-CM | POA: Diagnosis not present

## 2020-07-23 DIAGNOSIS — J449 Chronic obstructive pulmonary disease, unspecified: Secondary | ICD-10-CM | POA: Diagnosis not present

## 2020-07-23 DIAGNOSIS — R69 Illness, unspecified: Secondary | ICD-10-CM | POA: Diagnosis not present

## 2020-07-23 DIAGNOSIS — I509 Heart failure, unspecified: Secondary | ICD-10-CM | POA: Diagnosis not present

## 2020-07-27 ENCOUNTER — Other Ambulatory Visit (HOSPITAL_COMMUNITY)
Admission: RE | Admit: 2020-07-27 | Discharge: 2020-07-27 | Disposition: A | Discharge status: Home / Self Care | Payer: Medicare HMO | Source: Other Acute Inpatient Hospital | Attending: Internal Medicine | Admitting: Internal Medicine

## 2020-07-27 DIAGNOSIS — K529 Noninfective gastroenteritis and colitis, unspecified: Secondary | ICD-10-CM | POA: Diagnosis not present

## 2020-07-27 DIAGNOSIS — R69 Illness, unspecified: Secondary | ICD-10-CM | POA: Diagnosis not present

## 2020-07-27 DIAGNOSIS — J449 Chronic obstructive pulmonary disease, unspecified: Secondary | ICD-10-CM | POA: Diagnosis not present

## 2020-07-27 DIAGNOSIS — R001 Bradycardia, unspecified: Secondary | ICD-10-CM | POA: Diagnosis not present

## 2020-07-27 DIAGNOSIS — Z01812 Encounter for preprocedural laboratory examination: Secondary | ICD-10-CM | POA: Diagnosis not present

## 2020-07-27 DIAGNOSIS — N183 Chronic kidney disease, stage 3 unspecified: Secondary | ICD-10-CM | POA: Diagnosis not present

## 2020-07-27 DIAGNOSIS — I130 Hypertensive heart and chronic kidney disease with heart failure and stage 1 through stage 4 chronic kidney disease, or unspecified chronic kidney disease: Secondary | ICD-10-CM | POA: Diagnosis not present

## 2020-07-27 DIAGNOSIS — Z792 Long term (current) use of antibiotics: Secondary | ICD-10-CM | POA: Diagnosis not present

## 2020-07-27 DIAGNOSIS — I509 Heart failure, unspecified: Secondary | ICD-10-CM | POA: Diagnosis not present

## 2020-07-27 DIAGNOSIS — Z452 Encounter for adjustment and management of vascular access device: Secondary | ICD-10-CM | POA: Diagnosis not present

## 2020-07-27 DIAGNOSIS — Z5181 Encounter for therapeutic drug level monitoring: Secondary | ICD-10-CM | POA: Diagnosis not present

## 2020-07-27 LAB — CBC WITH DIFFERENTIAL/PLATELET
Abs Immature Granulocytes: 0.01 10*3/uL (ref 0.00–0.07)
Basophils Absolute: 0 10*3/uL (ref 0.0–0.1)
Basophils Relative: 1 %
Eosinophils Absolute: 0.2 10*3/uL (ref 0.0–0.5)
Eosinophils Relative: 4 %
HCT: 30.7 % — ABNORMAL LOW (ref 36.0–46.0)
Hemoglobin: 9.9 g/dL — ABNORMAL LOW (ref 12.0–15.0)
Immature Granulocytes: 0 %
Lymphocytes Relative: 24 %
Lymphs Abs: 1.1 10*3/uL (ref 0.7–4.0)
MCH: 34.1 pg — ABNORMAL HIGH (ref 26.0–34.0)
MCHC: 32.2 g/dL (ref 30.0–36.0)
MCV: 105.9 fL — ABNORMAL HIGH (ref 80.0–100.0)
Monocytes Absolute: 0.4 10*3/uL (ref 0.1–1.0)
Monocytes Relative: 8 %
Neutro Abs: 3.1 10*3/uL (ref 1.7–7.7)
Neutrophils Relative %: 63 %
Platelets: 161 10*3/uL (ref 150–400)
RBC: 2.9 MIL/uL — ABNORMAL LOW (ref 3.87–5.11)
RDW: 14.2 % (ref 11.5–15.5)
WBC: 4.8 10*3/uL (ref 4.0–10.5)
nRBC: 0 % (ref 0.0–0.2)

## 2020-07-27 LAB — COMPREHENSIVE METABOLIC PANEL
ALT: 11 U/L (ref 0–44)
AST: 16 U/L (ref 15–41)
Albumin: 3.1 g/dL — ABNORMAL LOW (ref 3.5–5.0)
Alkaline Phosphatase: 73 U/L (ref 38–126)
Anion gap: 9 (ref 5–15)
BUN: 16 mg/dL (ref 8–23)
CO2: 32 mmol/L (ref 22–32)
Calcium: 9.7 mg/dL (ref 8.9–10.3)
Chloride: 98 mmol/L (ref 98–111)
Creatinine, Ser: 1.56 mg/dL — ABNORMAL HIGH (ref 0.44–1.00)
GFR, Estimated: 36 mL/min — ABNORMAL LOW (ref >60)
Glucose, Bld: 90 mg/dL (ref 70–99)
Potassium: 3.4 mmol/L — ABNORMAL LOW (ref 3.5–5.1)
Sodium: 139 mmol/L (ref 135–145)
Total Bilirubin: 0.7 mg/dL (ref 0.3–1.2)
Total Protein: 5.7 g/dL — ABNORMAL LOW (ref 6.5–8.1)

## 2020-07-28 DIAGNOSIS — K52839 Microscopic colitis, unspecified: Secondary | ICD-10-CM | POA: Diagnosis not present

## 2020-07-28 DIAGNOSIS — K529 Noninfective gastroenteritis and colitis, unspecified: Secondary | ICD-10-CM | POA: Diagnosis not present

## 2020-07-29 DIAGNOSIS — Z452 Encounter for adjustment and management of vascular access device: Secondary | ICD-10-CM | POA: Diagnosis not present

## 2020-07-29 DIAGNOSIS — Z792 Long term (current) use of antibiotics: Secondary | ICD-10-CM | POA: Diagnosis not present

## 2020-07-29 DIAGNOSIS — R001 Bradycardia, unspecified: Secondary | ICD-10-CM | POA: Diagnosis not present

## 2020-07-29 DIAGNOSIS — I509 Heart failure, unspecified: Secondary | ICD-10-CM | POA: Diagnosis not present

## 2020-07-29 DIAGNOSIS — Z5181 Encounter for therapeutic drug level monitoring: Secondary | ICD-10-CM | POA: Diagnosis not present

## 2020-07-29 DIAGNOSIS — N183 Chronic kidney disease, stage 3 unspecified: Secondary | ICD-10-CM | POA: Diagnosis not present

## 2020-07-29 DIAGNOSIS — R69 Illness, unspecified: Secondary | ICD-10-CM | POA: Diagnosis not present

## 2020-07-29 DIAGNOSIS — I130 Hypertensive heart and chronic kidney disease with heart failure and stage 1 through stage 4 chronic kidney disease, or unspecified chronic kidney disease: Secondary | ICD-10-CM | POA: Diagnosis not present

## 2020-07-29 DIAGNOSIS — J449 Chronic obstructive pulmonary disease, unspecified: Secondary | ICD-10-CM | POA: Diagnosis not present

## 2020-07-29 DIAGNOSIS — K529 Noninfective gastroenteritis and colitis, unspecified: Secondary | ICD-10-CM | POA: Diagnosis not present

## 2020-07-31 DIAGNOSIS — Z452 Encounter for adjustment and management of vascular access device: Secondary | ICD-10-CM | POA: Diagnosis not present

## 2020-07-31 DIAGNOSIS — N183 Chronic kidney disease, stage 3 unspecified: Secondary | ICD-10-CM | POA: Diagnosis not present

## 2020-07-31 DIAGNOSIS — Z5181 Encounter for therapeutic drug level monitoring: Secondary | ICD-10-CM | POA: Diagnosis not present

## 2020-07-31 DIAGNOSIS — J449 Chronic obstructive pulmonary disease, unspecified: Secondary | ICD-10-CM | POA: Diagnosis not present

## 2020-07-31 DIAGNOSIS — R69 Illness, unspecified: Secondary | ICD-10-CM | POA: Diagnosis not present

## 2020-07-31 DIAGNOSIS — K529 Noninfective gastroenteritis and colitis, unspecified: Secondary | ICD-10-CM | POA: Diagnosis not present

## 2020-07-31 DIAGNOSIS — R001 Bradycardia, unspecified: Secondary | ICD-10-CM | POA: Diagnosis not present

## 2020-07-31 DIAGNOSIS — I509 Heart failure, unspecified: Secondary | ICD-10-CM | POA: Diagnosis not present

## 2020-07-31 DIAGNOSIS — Z792 Long term (current) use of antibiotics: Secondary | ICD-10-CM | POA: Diagnosis not present

## 2020-07-31 DIAGNOSIS — I130 Hypertensive heart and chronic kidney disease with heart failure and stage 1 through stage 4 chronic kidney disease, or unspecified chronic kidney disease: Secondary | ICD-10-CM | POA: Diagnosis not present

## 2020-08-04 DIAGNOSIS — I509 Heart failure, unspecified: Secondary | ICD-10-CM | POA: Diagnosis not present

## 2020-08-04 DIAGNOSIS — J690 Pneumonitis due to inhalation of food and vomit: Secondary | ICD-10-CM | POA: Diagnosis not present

## 2020-08-04 DIAGNOSIS — Z5181 Encounter for therapeutic drug level monitoring: Secondary | ICD-10-CM | POA: Diagnosis not present

## 2020-08-04 DIAGNOSIS — J9622 Acute and chronic respiratory failure with hypercapnia: Secondary | ICD-10-CM | POA: Diagnosis not present

## 2020-08-04 DIAGNOSIS — Z792 Long term (current) use of antibiotics: Secondary | ICD-10-CM | POA: Diagnosis not present

## 2020-08-04 DIAGNOSIS — I130 Hypertensive heart and chronic kidney disease with heart failure and stage 1 through stage 4 chronic kidney disease, or unspecified chronic kidney disease: Secondary | ICD-10-CM | POA: Diagnosis not present

## 2020-08-04 DIAGNOSIS — K529 Noninfective gastroenteritis and colitis, unspecified: Secondary | ICD-10-CM | POA: Diagnosis not present

## 2020-08-04 DIAGNOSIS — J441 Chronic obstructive pulmonary disease with (acute) exacerbation: Secondary | ICD-10-CM | POA: Diagnosis not present

## 2020-08-04 DIAGNOSIS — Z452 Encounter for adjustment and management of vascular access device: Secondary | ICD-10-CM | POA: Diagnosis not present

## 2020-08-04 DIAGNOSIS — J9621 Acute and chronic respiratory failure with hypoxia: Secondary | ICD-10-CM | POA: Diagnosis not present

## 2020-08-05 DIAGNOSIS — I130 Hypertensive heart and chronic kidney disease with heart failure and stage 1 through stage 4 chronic kidney disease, or unspecified chronic kidney disease: Secondary | ICD-10-CM | POA: Diagnosis not present

## 2020-08-05 DIAGNOSIS — J9621 Acute and chronic respiratory failure with hypoxia: Secondary | ICD-10-CM | POA: Diagnosis not present

## 2020-08-05 DIAGNOSIS — J690 Pneumonitis due to inhalation of food and vomit: Secondary | ICD-10-CM | POA: Diagnosis not present

## 2020-08-05 DIAGNOSIS — J441 Chronic obstructive pulmonary disease with (acute) exacerbation: Secondary | ICD-10-CM | POA: Diagnosis not present

## 2020-08-05 DIAGNOSIS — K529 Noninfective gastroenteritis and colitis, unspecified: Secondary | ICD-10-CM | POA: Diagnosis not present

## 2020-08-05 DIAGNOSIS — J9622 Acute and chronic respiratory failure with hypercapnia: Secondary | ICD-10-CM | POA: Diagnosis not present

## 2020-08-05 DIAGNOSIS — Z792 Long term (current) use of antibiotics: Secondary | ICD-10-CM | POA: Diagnosis not present

## 2020-08-05 DIAGNOSIS — I509 Heart failure, unspecified: Secondary | ICD-10-CM | POA: Diagnosis not present

## 2020-08-05 DIAGNOSIS — Z452 Encounter for adjustment and management of vascular access device: Secondary | ICD-10-CM | POA: Diagnosis not present

## 2020-08-05 DIAGNOSIS — Z5181 Encounter for therapeutic drug level monitoring: Secondary | ICD-10-CM | POA: Diagnosis not present

## 2020-08-06 DIAGNOSIS — J441 Chronic obstructive pulmonary disease with (acute) exacerbation: Secondary | ICD-10-CM | POA: Diagnosis not present

## 2020-08-06 DIAGNOSIS — I130 Hypertensive heart and chronic kidney disease with heart failure and stage 1 through stage 4 chronic kidney disease, or unspecified chronic kidney disease: Secondary | ICD-10-CM | POA: Diagnosis not present

## 2020-08-06 DIAGNOSIS — J9621 Acute and chronic respiratory failure with hypoxia: Secondary | ICD-10-CM | POA: Diagnosis not present

## 2020-08-06 DIAGNOSIS — Z792 Long term (current) use of antibiotics: Secondary | ICD-10-CM | POA: Diagnosis not present

## 2020-08-06 DIAGNOSIS — I509 Heart failure, unspecified: Secondary | ICD-10-CM | POA: Diagnosis not present

## 2020-08-06 DIAGNOSIS — Z5181 Encounter for therapeutic drug level monitoring: Secondary | ICD-10-CM | POA: Diagnosis not present

## 2020-08-06 DIAGNOSIS — J690 Pneumonitis due to inhalation of food and vomit: Secondary | ICD-10-CM | POA: Diagnosis not present

## 2020-08-06 DIAGNOSIS — K529 Noninfective gastroenteritis and colitis, unspecified: Secondary | ICD-10-CM | POA: Diagnosis not present

## 2020-08-06 DIAGNOSIS — J9622 Acute and chronic respiratory failure with hypercapnia: Secondary | ICD-10-CM | POA: Diagnosis not present

## 2020-08-06 DIAGNOSIS — Z452 Encounter for adjustment and management of vascular access device: Secondary | ICD-10-CM | POA: Diagnosis not present

## 2020-08-10 DIAGNOSIS — Z792 Long term (current) use of antibiotics: Secondary | ICD-10-CM | POA: Diagnosis not present

## 2020-08-10 DIAGNOSIS — Z452 Encounter for adjustment and management of vascular access device: Secondary | ICD-10-CM | POA: Diagnosis not present

## 2020-08-10 DIAGNOSIS — J9621 Acute and chronic respiratory failure with hypoxia: Secondary | ICD-10-CM | POA: Diagnosis not present

## 2020-08-10 DIAGNOSIS — K529 Noninfective gastroenteritis and colitis, unspecified: Secondary | ICD-10-CM | POA: Diagnosis not present

## 2020-08-10 DIAGNOSIS — Z5181 Encounter for therapeutic drug level monitoring: Secondary | ICD-10-CM | POA: Diagnosis not present

## 2020-08-10 DIAGNOSIS — J441 Chronic obstructive pulmonary disease with (acute) exacerbation: Secondary | ICD-10-CM | POA: Diagnosis not present

## 2020-08-10 DIAGNOSIS — J9622 Acute and chronic respiratory failure with hypercapnia: Secondary | ICD-10-CM | POA: Diagnosis not present

## 2020-08-10 DIAGNOSIS — I130 Hypertensive heart and chronic kidney disease with heart failure and stage 1 through stage 4 chronic kidney disease, or unspecified chronic kidney disease: Secondary | ICD-10-CM | POA: Diagnosis not present

## 2020-08-10 DIAGNOSIS — R109 Unspecified abdominal pain: Secondary | ICD-10-CM | POA: Diagnosis not present

## 2020-08-10 DIAGNOSIS — J690 Pneumonitis due to inhalation of food and vomit: Secondary | ICD-10-CM | POA: Diagnosis not present

## 2020-08-10 DIAGNOSIS — I509 Heart failure, unspecified: Secondary | ICD-10-CM | POA: Diagnosis not present

## 2020-08-12 DIAGNOSIS — J449 Chronic obstructive pulmonary disease, unspecified: Secondary | ICD-10-CM | POA: Diagnosis not present

## 2020-08-13 DIAGNOSIS — J9621 Acute and chronic respiratory failure with hypoxia: Secondary | ICD-10-CM | POA: Diagnosis not present

## 2020-08-13 DIAGNOSIS — J9622 Acute and chronic respiratory failure with hypercapnia: Secondary | ICD-10-CM | POA: Diagnosis not present

## 2020-08-13 DIAGNOSIS — I509 Heart failure, unspecified: Secondary | ICD-10-CM | POA: Diagnosis not present

## 2020-08-13 DIAGNOSIS — J690 Pneumonitis due to inhalation of food and vomit: Secondary | ICD-10-CM | POA: Diagnosis not present

## 2020-08-13 DIAGNOSIS — Z792 Long term (current) use of antibiotics: Secondary | ICD-10-CM | POA: Diagnosis not present

## 2020-08-13 DIAGNOSIS — J441 Chronic obstructive pulmonary disease with (acute) exacerbation: Secondary | ICD-10-CM | POA: Diagnosis not present

## 2020-08-13 DIAGNOSIS — Z5181 Encounter for therapeutic drug level monitoring: Secondary | ICD-10-CM | POA: Diagnosis not present

## 2020-08-13 DIAGNOSIS — I130 Hypertensive heart and chronic kidney disease with heart failure and stage 1 through stage 4 chronic kidney disease, or unspecified chronic kidney disease: Secondary | ICD-10-CM | POA: Diagnosis not present

## 2020-08-13 DIAGNOSIS — K529 Noninfective gastroenteritis and colitis, unspecified: Secondary | ICD-10-CM | POA: Diagnosis not present

## 2020-08-13 DIAGNOSIS — Z452 Encounter for adjustment and management of vascular access device: Secondary | ICD-10-CM | POA: Diagnosis not present

## 2020-08-18 DIAGNOSIS — Z792 Long term (current) use of antibiotics: Secondary | ICD-10-CM | POA: Diagnosis not present

## 2020-08-18 DIAGNOSIS — Z5181 Encounter for therapeutic drug level monitoring: Secondary | ICD-10-CM | POA: Diagnosis not present

## 2020-08-18 DIAGNOSIS — J9622 Acute and chronic respiratory failure with hypercapnia: Secondary | ICD-10-CM | POA: Diagnosis not present

## 2020-08-18 DIAGNOSIS — Z452 Encounter for adjustment and management of vascular access device: Secondary | ICD-10-CM | POA: Diagnosis not present

## 2020-08-18 DIAGNOSIS — J690 Pneumonitis due to inhalation of food and vomit: Secondary | ICD-10-CM | POA: Diagnosis not present

## 2020-08-18 DIAGNOSIS — I130 Hypertensive heart and chronic kidney disease with heart failure and stage 1 through stage 4 chronic kidney disease, or unspecified chronic kidney disease: Secondary | ICD-10-CM | POA: Diagnosis not present

## 2020-08-18 DIAGNOSIS — K529 Noninfective gastroenteritis and colitis, unspecified: Secondary | ICD-10-CM | POA: Diagnosis not present

## 2020-08-18 DIAGNOSIS — J441 Chronic obstructive pulmonary disease with (acute) exacerbation: Secondary | ICD-10-CM | POA: Diagnosis not present

## 2020-08-18 DIAGNOSIS — I509 Heart failure, unspecified: Secondary | ICD-10-CM | POA: Diagnosis not present

## 2020-08-18 DIAGNOSIS — J9621 Acute and chronic respiratory failure with hypoxia: Secondary | ICD-10-CM | POA: Diagnosis not present

## 2020-08-24 DIAGNOSIS — K529 Noninfective gastroenteritis and colitis, unspecified: Secondary | ICD-10-CM | POA: Diagnosis not present

## 2020-08-25 DIAGNOSIS — J9622 Acute and chronic respiratory failure with hypercapnia: Secondary | ICD-10-CM | POA: Diagnosis not present

## 2020-08-25 DIAGNOSIS — Z452 Encounter for adjustment and management of vascular access device: Secondary | ICD-10-CM | POA: Diagnosis not present

## 2020-08-25 DIAGNOSIS — I509 Heart failure, unspecified: Secondary | ICD-10-CM | POA: Diagnosis not present

## 2020-08-25 DIAGNOSIS — K529 Noninfective gastroenteritis and colitis, unspecified: Secondary | ICD-10-CM | POA: Diagnosis not present

## 2020-08-25 DIAGNOSIS — J441 Chronic obstructive pulmonary disease with (acute) exacerbation: Secondary | ICD-10-CM | POA: Diagnosis not present

## 2020-08-25 DIAGNOSIS — Z792 Long term (current) use of antibiotics: Secondary | ICD-10-CM | POA: Diagnosis not present

## 2020-08-25 DIAGNOSIS — I130 Hypertensive heart and chronic kidney disease with heart failure and stage 1 through stage 4 chronic kidney disease, or unspecified chronic kidney disease: Secondary | ICD-10-CM | POA: Diagnosis not present

## 2020-08-25 DIAGNOSIS — J690 Pneumonitis due to inhalation of food and vomit: Secondary | ICD-10-CM | POA: Diagnosis not present

## 2020-08-25 DIAGNOSIS — J9621 Acute and chronic respiratory failure with hypoxia: Secondary | ICD-10-CM | POA: Diagnosis not present

## 2020-08-25 DIAGNOSIS — Z5181 Encounter for therapeutic drug level monitoring: Secondary | ICD-10-CM | POA: Diagnosis not present

## 2020-08-27 DIAGNOSIS — N181 Chronic kidney disease, stage 1: Secondary | ICD-10-CM | POA: Diagnosis not present

## 2020-08-27 DIAGNOSIS — K5712 Diverticulitis of small intestine without perforation or abscess without bleeding: Secondary | ICD-10-CM | POA: Diagnosis not present

## 2020-08-27 DIAGNOSIS — Z6821 Body mass index (BMI) 21.0-21.9, adult: Secondary | ICD-10-CM | POA: Diagnosis not present

## 2020-08-27 DIAGNOSIS — J449 Chronic obstructive pulmonary disease, unspecified: Secondary | ICD-10-CM | POA: Diagnosis not present

## 2020-08-27 DIAGNOSIS — Z0000 Encounter for general adult medical examination without abnormal findings: Secondary | ICD-10-CM | POA: Diagnosis not present

## 2020-08-27 DIAGNOSIS — E119 Type 2 diabetes mellitus without complications: Secondary | ICD-10-CM | POA: Diagnosis not present

## 2020-09-11 DIAGNOSIS — J449 Chronic obstructive pulmonary disease, unspecified: Secondary | ICD-10-CM | POA: Diagnosis not present

## 2020-09-21 ENCOUNTER — Other Ambulatory Visit (INDEPENDENT_AMBULATORY_CARE_PROVIDER_SITE_OTHER): Payer: Self-pay

## 2020-09-21 ENCOUNTER — Encounter (INDEPENDENT_AMBULATORY_CARE_PROVIDER_SITE_OTHER): Payer: Self-pay | Admitting: Gastroenterology

## 2020-09-21 ENCOUNTER — Ambulatory Visit (INDEPENDENT_AMBULATORY_CARE_PROVIDER_SITE_OTHER): Payer: Medicare HMO | Admitting: Gastroenterology

## 2020-09-21 ENCOUNTER — Other Ambulatory Visit: Payer: Self-pay

## 2020-09-21 DIAGNOSIS — K5732 Diverticulitis of large intestine without perforation or abscess without bleeding: Secondary | ICD-10-CM | POA: Diagnosis not present

## 2020-09-21 DIAGNOSIS — J9601 Acute respiratory failure with hypoxia: Secondary | ICD-10-CM

## 2020-09-21 NOTE — Patient Instructions (Addendum)
Schedule CT abdomen/pelvis with IV contrast Based on studies will decide timing of her colonoscopy and EGD Take Imodium as needed for episodes of fecal soiling/diarrhea

## 2020-09-21 NOTE — Progress Notes (Signed)
Tara [name], M.D. Gastroenterology & Hepatology [facility] [address] [place], [place] [address] Primary Care Physician: Tara Burly, MD [place] [id number]  Referring MD: PCP  Chief Complaint: Diverticulitis and abscess  History of Present Illness: Tara [name] is a 70 y.o. 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  Patient reports that since Christmas 2021 she was presenting episodes of RLQ pain. She had intermittent abdominal pain since then, but she reports that on 06/04/2020 she presented severe worsening of her abdominal pain along with diarrhea which made her go to [facility] on that day.  The patient had initial management at this hospital where she was found to have presence of sigmoid diverticulitis with presence of retention of stool proximal to mid segment which was managed with antibiotic treatment.  However, despite broad antibiotic coverage she had repeat scan x2 (most recent on 07/16/2020) which showed presence of persistent long segment diverticulitis in her sigmoid colon but also presence of intramural abscess and presence of abdominal pelvic ascites.  Due to this, the patient was managed by her surgeon Dr. Ladona Horns with broad-spectrum antibiotics through a PICC line (patient was on ciprofloxacin and metronidazole, then switched to ertapenem).  She no longer has her PICC line. The patient finished her antibiotics close to 3 weeks ago.  The patient reports that overall she is feeling better as her abdominal pain is much milder compared to the past.  Reports that when she tries to pass gas she may pass mucus or a little of stool. She reports that if she eats anything she will have an urgent bowel movement. Currently she has very occasional mild pain in her lower abdomen. She  is having one meal per day. Since April her weight has been stable, although she lost significant weight in the last 3 years as she changed the amount of food she was eating.  Her daughter states that she has noticed she is doing much better compared to prior. Is not taking any antidiarrheals at the moment.  She was supposed to have a repeat EGD and colonoscopy 8 weeks after her last visit with Dr. Ladona Horns but states that she would rather have it performed by gastroenterologist.  The patient also reports that she has had off and on episodes of choking with different textures of food, either soft or liquids. Has presented issues with choking with the intake of ice cream yesterday.  Does not remember exactly for how long the symptoms have been present but is causing some significant discomfort.  The patient denies having any nausea, vomiting, fever, chills, hematochezia, melena, hematemesis, jaundice, pruritus.  Last [id number] Last Colonoscopy:at age 13,had hemorrhoids but no other findings per the patient  FHx: neg for any gastrointestinal/liver disease,grandfather stomach cancer, father lung cancer, uncle prostate cancer, aunts breast cancer x2 Social: smokes a pack of cigs per day, neg alcohol or illicit drug use Surgical:tubal ligation, cholecystectomy  Past Medical History: Past Medical History:  Diagnosis Date   CHF (congestive heart failure) (HCC)    COPD (chronic obstructive pulmonary disease) (Ages)    Diabetes mellitus without complication ([place])    Hypertension     Past Surgical History: Past Surgical History:  Procedure Laterality Date   ABDOMINAL HYSTERECTOMY     APPENDECTOMY     BACK SURGERY     CHOLECYSTECTOMY  Family History:History reviewed. No pertinent family history.  Social History: Social History   Tobacco Use  Smoking Status Some Days   Packs/day: 0.50   Types: Cigarettes  Smokeless Tobacco Never   Social History   Substance and Sexual Activity   Alcohol Use Not Currently   Social History   Substance and Sexual Activity  Drug Use Never    Allergies: Allergies  Allergen Reactions   Codeine     Medications: Current Outpatient Medications  Medication Sig Dispense Refill   aspirin EC 81 MG tablet Take 81 mg by mouth daily. Swallow whole.     atorvastatin (LIPITOR) 20 MG tablet Take 20 mg by mouth daily.     diltiazem (CARDIZEM SR) 120 MG 12 hr capsule Take 120 mg by mouth daily.     ELIQUIS 5 MG TABS tablet Take 1 tablet by mouth 2 (two) times daily.     EUTHYROX 50 MCG tablet Take 50 mcg by mouth daily.     furosemide (LASIX) 20 MG tablet Take 1 tablet (20 mg total) by mouth daily. 30 tablet 11   levalbuterol (XOPENEX) 0.63 MG/3ML nebulizer solution Take 0.63 mg by nebulization every 4 (four) hours as needed for wheezing or shortness of breath.     LORazepam (ATIVAN) 0.5 MG tablet Take 0.5 mg by mouth 2 (two) times daily.     Omega-3 Fatty Acids (FISH OIL) 1000 MG CAPS Take 1 capsule by mouth 3 (three) times daily.     pantoprazole (PROTONIX) 40 MG tablet Take 1 tablet by mouth daily.     tiZANidine (ZANAFLEX) 4 MG tablet Take 4 mg by mouth at bedtime.     VITAMIN D PO Take by mouth. 10 mcg 2 qam     No current facility-administered medications for this visit.    Review of Systems: GENERAL: negative for malaise, night sweats HEENT: No changes in hearing or vision, no nose bleeds or other nasal problems. NECK: Negative for lumps, goiter, pain and significant neck swelling RESPIRATORY: Negative for cough, wheezing CARDIOVASCULAR: Negative for chest pain, leg swelling, palpitations, orthopnea GI: SEE HPI MUSCULOSKELETAL: Negative for joint pain or swelling, back pain, and muscle pain. SKIN: Negative for lesions, rash PSYCH: Negative for sleep disturbance, mood disorder and recent psychosocial stressors. HEMATOLOGY Negative for prolonged bleeding, bruising easily, and swollen nodes. ENDOCRINE: Negative for cold or heat  intolerance, polyuria, polydipsia and goiter. NEURO: negative for tremor, gait imbalance, syncope and seizures. The remainder of the review of systems is noncontributory.   Physical Exam: BP 126/81 (BP Location: Right Arm, Patient Position: Sitting, Cuff Size: Normal)   Pulse 81   Temp 98.7 F (37.1 C) (Oral)   Ht 5' (1.524 m)   Wt 116 lb 14.4 oz (53 kg)   BMI 22.83 kg/m  GENERAL: The patient is AO x3, in no acute distress. HEENT: Head is normocephalic and atraumatic. EOMI are intact. Mouth is well hydrated and without lesions. NECK: Supple. No masses LUNGS: Clear to auscultation. No presence of rhonchi/wheezing/rales. Adequate chest expansion HEART: RRR, normal s1 and s2. ABDOMEN: tender to palpation in the LLQ, no guarding, no peritoneal signs, and nondistended. BS +. No masses. EXTREMITIES: Without any cyanosis, clubbing, rash, lesions or edema. NEUROLOGIC: AOx3, no focal motor deficit. SKIN: no jaundice, no rashes   Imaging/Labs: as above  I personally reviewed and interpreted the available labs, imaging and endoscopic files.  Impression and Plan: LYNESHA KRAHN is a 70 y.o. 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  The patient has presented complex history of diverticulitis complicated by presence of intramural abscess requiring broad-spectrum antibiotics through a PICC line with course of ertapenem and ciprofloxacin and metronidazole.  Clinically it seems that she has much more improved with the antibiotic course and is having very mild pain in the left lower quadrant.  Given the complexity of her abscess, I would like to repeat the scan prior to scheduling any endoscopic investigations.  If the inflammation and more importantly the abscess has improved weight may proceed safely to perform this but if there is persistence of the abscess I will prefer  obtain a surgical consultation given persistence of the infection.  Both the patient and the daughter agree understood.  For now she can take Imodium as needed for episodes of fecal soiling and diarrhea.  - Schedule CT abdomen/pelvis with IV contrast - Based on studies will decide timing of her colonoscopy and EGD - Take Imodium as needed for episodes of fecal soiling/diarrhea  All questions were answered.      Tara Peppers, MD Gastroenterology and Hepatology [facility]

## 2020-10-12 DIAGNOSIS — J449 Chronic obstructive pulmonary disease, unspecified: Secondary | ICD-10-CM | POA: Diagnosis not present

## 2020-10-13 ENCOUNTER — Ambulatory Visit (HOSPITAL_COMMUNITY): Payer: Medicare HMO

## 2020-11-05 ENCOUNTER — Telehealth: Payer: Self-pay | Admitting: *Deleted

## 2020-11-05 ENCOUNTER — Telehealth (INDEPENDENT_AMBULATORY_CARE_PROVIDER_SITE_OTHER): Payer: Self-pay

## 2020-11-05 NOTE — Telephone Encounter (Signed)
CPT Code: [id number]  Authorization Number: [id number] Review Date: 11/05/2020 2:03:55 PM Expiration Date: 05/04/2021 Status: Your case has been Approved.

## 2020-11-05 NOTE — Telephone Encounter (Signed)
Per Daugther Dr. Nicholes Stairs states he did not order anything on this patient . We have gotten approval on CT at [facility] for 11/10/2020. A uth # [id number]. Daughter aware all ok and proceed with the appointment as planned.

## 2020-11-05 NOTE — Telephone Encounter (Signed)
Patient has a CT Abd Pelvis scheduled for [id number], per [employer] precert they have no auth on file for [employer] with the insurance it is with [facility]. I called patient insurance and was switched to [facility] spoke with Sindy Messing there whom states a Dr. Karlyn Agee was the ordering Dr and she could not discuss this with me any further due to [place]. I have called the patient daughter and asked that she please call Dr. Ladona Horns office and see what the issue is with and see if they also order it. I asked that she please let know what she finds out and call us back so we can cancel it on 11/10/2020. She states understanding.

## 2020-11-10 ENCOUNTER — Other Ambulatory Visit: Payer: Self-pay

## 2020-11-10 ENCOUNTER — Other Ambulatory Visit (INDEPENDENT_AMBULATORY_CARE_PROVIDER_SITE_OTHER): Payer: Self-pay | Admitting: Gastroenterology

## 2020-11-10 ENCOUNTER — Ambulatory Visit (HOSPITAL_COMMUNITY)
Admission: RE | Admit: 2020-11-10 | Discharge: 2020-11-10 | Disposition: A | Discharge status: Home / Self Care | Payer: Medicare HMO | Source: Ambulatory Visit | Attending: Gastroenterology | Admitting: Gastroenterology

## 2020-11-10 DIAGNOSIS — N3289 Other specified disorders of bladder: Secondary | ICD-10-CM | POA: Diagnosis not present

## 2020-11-10 DIAGNOSIS — K5732 Diverticulitis of large intestine without perforation or abscess without bleeding: Secondary | ICD-10-CM

## 2020-11-10 DIAGNOSIS — K5730 Diverticulosis of large intestine without perforation or abscess without bleeding: Secondary | ICD-10-CM | POA: Diagnosis not present

## 2020-11-10 DIAGNOSIS — K6389 Other specified diseases of intestine: Secondary | ICD-10-CM | POA: Diagnosis not present

## 2020-11-10 DIAGNOSIS — I700 Atherosclerosis of aorta: Secondary | ICD-10-CM | POA: Diagnosis not present

## 2020-11-10 LAB — POCT I-STAT CREATININE: Creatinine, Ser: 1.9 mg/dL — ABNORMAL HIGH (ref 0.44–1.00)

## 2020-11-10 MED ORDER — IOHEXOL 350 MG/ML SOLN
100.0000 mL | Freq: Once | INTRAVENOUS | Status: AC | PRN
  Administered 2020-11-10: 80 mL via INTRAVENOUS

## 2020-11-11 ENCOUNTER — Telehealth (INDEPENDENT_AMBULATORY_CARE_PROVIDER_SITE_OTHER): Payer: Self-pay | Admitting: Internal Medicine

## 2020-11-11 ENCOUNTER — Other Ambulatory Visit: Payer: Self-pay

## 2020-11-11 ENCOUNTER — Emergency Department (HOSPITAL_COMMUNITY)
Admission: EM | Admit: 2020-11-11 | Discharge: 2020-11-11 | Disposition: A | Discharge status: Home / Self Care | Payer: Medicare HMO | Attending: Emergency Medicine | Admitting: Emergency Medicine

## 2020-11-11 ENCOUNTER — Encounter (HOSPITAL_COMMUNITY): Payer: Self-pay

## 2020-11-11 DIAGNOSIS — Z5321 Procedure and treatment not carried out due to patient leaving prior to being seen by health care provider: Secondary | ICD-10-CM | POA: Insufficient documentation

## 2020-11-11 DIAGNOSIS — R109 Unspecified abdominal pain: Secondary | ICD-10-CM | POA: Diagnosis not present

## 2020-11-11 LAB — CBC WITH DIFFERENTIAL/PLATELET
Abs Immature Granulocytes: 0.02 10*3/uL (ref 0.00–0.07)
Basophils Absolute: 0.1 10*3/uL (ref 0.0–0.1)
Basophils Relative: 1 %
Eosinophils Absolute: 0.2 10*3/uL (ref 0.0–0.5)
Eosinophils Relative: 3 %
HCT: 32.3 % — ABNORMAL LOW (ref 36.0–46.0)
Hemoglobin: 10.4 g/dL — ABNORMAL LOW (ref 12.0–15.0)
Immature Granulocytes: 0 %
Lymphocytes Relative: 25 %
Lymphs Abs: 1.5 10*3/uL (ref 0.7–4.0)
MCH: 32.6 pg (ref 26.0–34.0)
MCHC: 32.2 g/dL (ref 30.0–36.0)
MCV: 101.3 fL — ABNORMAL HIGH (ref 80.0–100.0)
Monocytes Absolute: 0.5 10*3/uL (ref 0.1–1.0)
Monocytes Relative: 8 %
Neutro Abs: 3.7 10*3/uL (ref 1.7–7.7)
Neutrophils Relative %: 63 %
Platelets: 193 10*3/uL (ref 150–400)
RBC: 3.19 MIL/uL — ABNORMAL LOW (ref 3.87–5.11)
RDW: 14.2 % (ref 11.5–15.5)
WBC: 5.9 10*3/uL (ref 4.0–10.5)
nRBC: 0 % (ref 0.0–0.2)

## 2020-11-11 LAB — BASIC METABOLIC PANEL
Anion gap: 7 (ref 5–15)
BUN: 28 mg/dL — ABNORMAL HIGH (ref 8–23)
CO2: 31 mmol/L (ref 22–32)
Calcium: 9.3 mg/dL (ref 8.9–10.3)
Chloride: 97 mmol/L — ABNORMAL LOW (ref 98–111)
Creatinine, Ser: 1.87 mg/dL — ABNORMAL HIGH (ref 0.44–1.00)
GFR, Estimated: 29 mL/min — ABNORMAL LOW (ref >60)
Glucose, Bld: 88 mg/dL (ref 70–99)
Potassium: 3.8 mmol/L (ref 3.5–5.1)
Sodium: 135 mmol/L (ref 135–145)

## 2020-11-11 NOTE — ED Triage Notes (Signed)
Pt to er, daughter with pt, states that MD Corbin Ade called to tell them to come to the er.   Per md pt has an obstruction

## 2020-11-11 NOTE — Telephone Encounter (Signed)
I reviewed patient's abdominal CT with Dr. Thornton Papas earlier today. CT was done yesterday but read today. Patient with history of diverticulitis.  She had follow-up CT which reveals air in the bladder of wall thickening to segment of sigmoid colon and focus of gas outside the lumen concerning for walled off abscess. I reviewed findings with patient's daughter Carloyn Jaeger recommended that she be brought to emergency room for evaluation.  She will need to be hospitalized for IV antibiotics and further management.

## 2020-11-11 NOTE — ED Notes (Signed)
Left d/t wait times

## 2020-11-12 ENCOUNTER — Ambulatory Visit (INDEPENDENT_AMBULATORY_CARE_PROVIDER_SITE_OTHER): Payer: Medicare HMO | Admitting: Internal Medicine

## 2020-11-12 ENCOUNTER — Encounter (INDEPENDENT_AMBULATORY_CARE_PROVIDER_SITE_OTHER): Payer: Self-pay | Admitting: Internal Medicine

## 2020-11-12 VITALS — BP 122/73 | HR 55 | Temp 98.5°F | Ht 60.0 in | Wt 117.2 lb

## 2020-11-12 DIAGNOSIS — K5732 Diverticulitis of large intestine without perforation or abscess without bleeding: Secondary | ICD-10-CM

## 2020-11-12 DIAGNOSIS — J449 Chronic obstructive pulmonary disease, unspecified: Secondary | ICD-10-CM | POA: Diagnosis not present

## 2020-11-12 DIAGNOSIS — N321 Vesicointestinal fistula: Secondary | ICD-10-CM | POA: Diagnosis not present

## 2020-11-12 DIAGNOSIS — N390 Urinary tract infection, site not specified: Secondary | ICD-10-CM | POA: Diagnosis not present

## 2020-11-12 MED ORDER — SULFAMETHOXAZOLE-TRIMETHOPRIM 800-160 MG PO TABS: 1.0000 | ORAL_TABLET | Freq: Two times a day (BID) | ORAL | 0 refills | Status: DC

## 2020-11-12 MED ORDER — METRONIDAZOLE 500 MG PO TABS: 500.0000 mg | ORAL_TABLET | Freq: Two times a day (BID) | ORAL | 0 refills | Status: DC

## 2020-11-12 NOTE — Patient Instructions (Signed)
Begin antibiotic as soon as urine sample provided to the lab. Report to emergency room if you have fever over 100F abdominal pain recurs.

## 2020-11-12 NOTE — Progress Notes (Signed)
Presenting complaint;  Patient is here to discuss results of CT and management of complicated diverticulitis.  Database and subjective:  Patient is a 70 year old Caucasian female with multiple medical problems including O2 dependent COPD, continued cigarette smoking hypertension diastolic dysfunction/CHF was also anticoagulated(she does not know why) who was seen in our office by Dr. Jenetta Downer on 09/21/2020 as a follow-up to complicated diverticulitis for which she was hospitalized at [facility] in in May this year and was treated with 4 weeks of IV antibiotics followed by p.o. antibiotics 1 to 2 weeks.  He recommended follow-up CT prior to considering colonoscopy.  CT was scheduled to be performed on 10/13/2020 but canceled and finally performed 2 days ago.  CT results are communicated to me by Dr. Lavonia Dana.  Given findings of air in the bladder as well as small focus of air outside the colon I recommended she be evaluated in the emergency room. Patient went to emergency room and left after waiting for several hours.  She did have a CBC and her WBC was normal. I therefore asked patient to come to office for evaluation. She is accompanied by her daughter Carloyn Jaeger with whom patient lives now.  Patient denies abdominal pain fever or chills.  She says she feels cold all the time but that is not new.  She has not lost any weight since she was last seen in our office about 7 weeks ago.  On most days she has 1-2 stools but yesterday and during the night she had multiple stools.  She also complains of passing black stools yesterday and early this morning.  She has not experienced rectal bleeding. Patient states she has been passing here as well as cloudy urine.  This started about 3 weeks ago.  She denies hematuria. Patient is on 2 L of O2 via nasal cannula.  She says she would has been trying hard to quit.  She was down to 10 cigarettes/day but since her sickness she has gone back to smoking a pack a day.  She says  she is not able to walk long distance because of muscle weakness and not necessarily because of shortness of breath.  She does not use nebulizer every day. Patient states that her last colonoscopy was 3 to 4 years ago but does not remember anything else.   Current Medications: Outpatient Encounter Medications as of 11/12/2020  Medication Sig   aspirin EC 81 MG tablet Take 81 mg by mouth daily. Swallow whole.   atorvastatin (LIPITOR) 20 MG tablet Take 20 mg by mouth daily.   diltiazem (CARDIZEM SR) 120 MG 12 hr capsule Take 120 mg by mouth daily.   ELIQUIS 5 MG TABS tablet Take 5 mg by mouth 2 (two) times daily. 2.5 mg bid per daughter.   EUTHYROX 50 MCG tablet Take 50 mcg by mouth daily.   furosemide (LASIX) 20 MG tablet Take 1 tablet (20 mg total) by mouth daily.   levalbuterol (XOPENEX) 0.63 MG/3ML nebulizer solution Take 0.63 mg by nebulization every 4 (four) hours as needed for wheezing or shortness of breath.   LORazepam (ATIVAN) 0.5 MG tablet Take 0.5 mg by mouth 2 (two) times daily.   Omega-3 Fatty Acids (FISH OIL) 1000 MG CAPS Take 1 capsule by mouth 3 (three) times daily.   pantoprazole (PROTONIX) 40 MG tablet Take 1 tablet by mouth daily.   tiZANidine (ZANAFLEX) 4 MG tablet Take 4 mg by mouth at bedtime.   VITAMIN D PO Take by mouth. 10 mcg  2 qam   No facility-administered encounter medications on file as of 11/12/2020.   Past Medical History:  Diagnosis Date   CHF (congestive heart failure) (HCC)    COPD (chronic obstructive pulmonary disease) (HCC)    Diabetes mellitus without complication ([place])    Hypertension    Past Surgical History:  Procedure Laterality Date   ABDOMINAL HYSTERECTOMY     APPENDECTOMY     BACK SURGERY     CHOLECYSTECTOMY        Objective: Blood pressure 122/73, pulse (!) 55, temperature 98.5 F (36.9 C), temperature source Oral, height 5' (1.524 m), weight 117 lb 3.2 oz (53.2 kg). Thin Caucasian female in no acute distress. She is on nasal  O2. Conjunctiva is pink. Sclera is nonicteric Oropharyngeal mucosa is normal. She is edentulous. No neck masses or thyromegaly noted. Cardiac exam with regular rhythm normal S1 and S2. No murmur or gallop noted. Lungs are clear to auscultation. Abdomen is symmetrical.  Bowel sounds are normal.  On palpation she has mild tenderness in left lower quadrant without guarding or rebound.  No organomegaly or masses. Rectal examination reveals dark brown stool in the vault and stool is guaiac negative. She has trace edema around ankles.  Labs/studies Results:   CBC Latest Ref Rng & Units 11/11/2020 07/27/2020 07/15/2020  WBC 4.0 - 10.5 K/uL 5.9 4.8 -  Hemoglobin 12.0 - 15.0 g/dL 10.4(L) 9.9(L) 9.2(L)  Hematocrit 36.0 - 46.0 % 32.3(L) 30.7(L) 27.0(L)  Platelets 150 - 400 K/uL 193 161 -    CMP Latest Ref Rng & Units 11/11/2020 11/10/2020 07/27/2020  Glucose 70 - 99 mg/dL 88 - 90  BUN 8 - 23 mg/dL 28(H) - 16  Creatinine 0.44 - 1.00 mg/dL 1.87(H) 1.90(H) 1.56(H)  Sodium 135 - 145 mmol/L 135 - 139  Potassium 3.5 - 5.1 mmol/L 3.8 - 3.4(L)  Chloride 98 - 111 mmol/L 97(L) - 98  CO2 22 - 32 mmol/L 31 - 32  Calcium 8.9 - 10.3 mg/dL 9.3 - 9.7  Total Protein 6.5 - 8.1 g/dL - - 5.7(L)  Total Bilirubin 0.3 - 1.2 mg/dL - - 0.7  Alkaline Phos 38 - 126 U/L - - 73  AST 15 - 41 U/L - - 16  ALT 0 - 44 U/L - - 11    Hepatic Function Latest Ref Rng & Units 07/27/2020 07/15/2020 07/05/2020  Total Protein 6.5 - 8.1 g/dL 5.7(L) 5.9(L) 5.4(L)  Albumin 3.5 - 5.0 g/dL 3.1(L) 3.0(L) 2.7(L)  AST 15 - 41 U/L 16 13(L) 9(L)  ALT 0 - 44 U/L _0 Alk Phosphatase 38 - 126 U/L 73 59 45  Total Bilirubin 0.3 - 1.2 mg/dL 0.7 1.3(H) 0.6    CLINICAL DATA:  LEFT lower quadrant abdominal pain, history of diverticulitis with abscess   EXAM: CT ABDOMEN AND PELVIS WITHOUT CONTRAST   TECHNIQUE: Multidetector CT imaging of the abdomen and pelvis was performed following the standard protocol without IV contrast. Sagittal  and coronal MPR images reconstructed from axial data set. Patient drank dilute oral contrast for exam   COMPARISON:  07/16/2020   FINDINGS: Lower chest: Nodular density LEFT lower lobe 5 mm diameter unchanged since 06/28/2020. Lung bases otherwise clear.   Hepatobiliary: Gallbladder surgically absent.  Liver unremarkable.   Pancreas: Normal appearance   Spleen: Normal appearance   Adrenals/Urinary Tract: Mild thickening of LEFT adrenal gland without mass. Air within urinary bladder. Wall thickening of the LEFT superior urinary bladder. Adrenal glands, kidneys, ureters, and bladder  otherwise unremarkable   Stomach/Bowel: Low lying cecum in pelvis. Appendix surgically absent. Stomach and small bowel loops unremarkable. Distal colonic diverticulosis. Wall thickening of the sigmoid colon again identified consistent with diverticulitis seen previously. Small focal gas collection identified extraluminal adjacent between the sigmoid colon and the LEFT superior margin of the urinary bladder, could represent sequela of prior diverticular abscess though cannot exclude fistula to the urinary bladder due to the presence of air within the bladder and wall thickening of the adjacent bladder. Remainder of colon unremarkable.   Vascular/Lymphatic: Atherosclerotic calcifications aorta and iliac arteries without aneurysm. No adenopathy.   Reproductive: Uterus surgically absent with nonvisualization of ovaries   Other: No free intraperitoneal air or fluid.  No hernia.   Musculoskeletal: Prior lumbar fusion L4-L5. Degenerative disc disease changes thoracolumbar spine.   IMPRESSION: Persistent wall thickening of the sigmoid colon consistent with diverticulitis.   Small focal gas collection identified adjacent to the sigmoid colon and the LEFT superior margin of the urinary bladder, could represent sequela of prior diverticular abscess though cannot exclude fistula to the urinary bladder  due to the presence of air within the bladder and wall thickening of the adjacent superior LEFT bladder wall.   Aortic Atherosclerosis (ICD10-I70.0).   Findings discussed with Dr. Laural Golden on 11/11/2020 at 1037 hours.     Electronically Signed   By: Lavonia Dana MD  Please note that I reviewed current CT with Dr. Thornton Papas and we also reviewed CT from 07/16/2020 which she had at [facility].  Abscess cavity no appears to contain gas.  There was no air and bladder on May 2022 study.   Assessment:  #1.  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  She does not appear to be acutely ill.  Therefore she can be managed on an outpatient basis however if she develops abdominal pain or fever she will immediately need to be hospitalized.  Given her condition she will need a colostomy which can be reversed at a later date if her condition allows.  #2.  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  #3.  Anemia.  Anemia is possibly due to chronic disease.  Stool is guaiac negative.  #4.  O2 dependent COPD.  Patient is still smoking and unfortunately high risk for intervention.   Plan:  Patient was about urinalysis and culture. Begin Bactrim DS 1 tablet p.o. twice daily for 10 days and metronidazole 500 mg by mouth twice daily for 10 days. Patient advised to report to emergency room if she starts having abdominal pain or fever. Surgical consultation with Dr. Michael Boston of [facility] surgery.

## 2020-11-14 LAB — URINALYSIS
Bilirubin Urine: NEGATIVE
Glucose, UA: NEGATIVE
Ketones, ur: NEGATIVE
Nitrite: POSITIVE — AB
Specific Gravity, Urine: 1.008 (ref 1.001–1.035)
pH: 6 (ref 5.0–8.0)

## 2020-11-14 LAB — URINE CULTURE
MICRO NUMBER:: 12348291
SPECIMEN QUALITY:: ADEQUATE

## 2020-11-18 DIAGNOSIS — H5213 Myopia, bilateral: Secondary | ICD-10-CM | POA: Diagnosis not present

## 2020-12-02 ENCOUNTER — Telehealth: Payer: Self-pay | Admitting: Primary Care

## 2020-12-02 DIAGNOSIS — E1121 Type 2 diabetes mellitus with diabetic nephropathy: Secondary | ICD-10-CM | POA: Diagnosis not present

## 2020-12-02 DIAGNOSIS — J449 Chronic obstructive pulmonary disease, unspecified: Secondary | ICD-10-CM | POA: Diagnosis not present

## 2020-12-02 DIAGNOSIS — Z1331 Encounter for screening for depression: Secondary | ICD-10-CM | POA: Diagnosis not present

## 2020-12-02 DIAGNOSIS — K5712 Diverticulitis of small intestine without perforation or abscess without bleeding: Secondary | ICD-10-CM | POA: Diagnosis not present

## 2020-12-02 DIAGNOSIS — Z0000 Encounter for general adult medical examination without abnormal findings: Secondary | ICD-10-CM | POA: Diagnosis not present

## 2020-12-02 DIAGNOSIS — N1832 Chronic kidney disease, stage 3b: Secondary | ICD-10-CM | POA: Diagnosis not present

## 2020-12-02 DIAGNOSIS — I700 Atherosclerosis of aorta: Secondary | ICD-10-CM | POA: Diagnosis not present

## 2020-12-02 DIAGNOSIS — I10 Essential (primary) hypertension: Secondary | ICD-10-CM | POA: Diagnosis not present

## 2020-12-02 DIAGNOSIS — Z6821 Body mass index (BMI) 21.0-21.9, adult: Secondary | ICD-10-CM | POA: Diagnosis not present

## 2020-12-02 DIAGNOSIS — N181 Chronic kidney disease, stage 1: Secondary | ICD-10-CM | POA: Diagnosis not present

## 2020-12-02 NOTE — Telephone Encounter (Signed)
Fax received from [facility] to perform Colon surgery for Diverticulitis with possible colovesical fistula on patient.  Patient needs surgery clearance. Patient has never been seen in the office but saw Dr. Halford Chessman in hospital in April. Office protocol is a risk assessment can be sent to surgeon if patient has been seen in 60 days or less.   Sending to [facility] for risk assessment, Patient has been scheduled for OV on 12/07/20 at 9:30.

## 2020-12-03 NOTE — Telephone Encounter (Signed)
She needs full assessment for COPD, sleep apnea, recurrent pneumonia and hypoxic/hypercapnic respiratory failure.  She has not had any outpt pulmonary assessment for any of these issues.

## 2020-12-03 NOTE — Telephone Encounter (Signed)
I'd have to see her in office for assessment

## 2020-12-03 NOTE — Telephone Encounter (Signed)
Patient is already scheduled for OV and clearance was marked as URGENT/ASAP  Next Appt With Pulmonology Martyn Ehrich, NP)12/07/2020 at  9:30 AM

## 2020-12-04 DIAGNOSIS — I480 Paroxysmal atrial fibrillation: Secondary | ICD-10-CM | POA: Diagnosis not present

## 2020-12-04 DIAGNOSIS — N181 Chronic kidney disease, stage 1: Secondary | ICD-10-CM | POA: Diagnosis not present

## 2020-12-04 DIAGNOSIS — J449 Chronic obstructive pulmonary disease, unspecified: Secondary | ICD-10-CM | POA: Diagnosis not present

## 2020-12-04 DIAGNOSIS — I10 Essential (primary) hypertension: Secondary | ICD-10-CM | POA: Diagnosis not present

## 2020-12-04 DIAGNOSIS — E1121 Type 2 diabetes mellitus with diabetic nephropathy: Secondary | ICD-10-CM | POA: Diagnosis not present

## 2020-12-04 DIAGNOSIS — N1832 Chronic kidney disease, stage 3b: Secondary | ICD-10-CM | POA: Diagnosis not present

## 2020-12-04 DIAGNOSIS — I700 Atherosclerosis of aorta: Secondary | ICD-10-CM | POA: Diagnosis not present

## 2020-12-07 ENCOUNTER — Encounter: Payer: Self-pay | Admitting: Primary Care

## 2020-12-07 ENCOUNTER — Ambulatory Visit (INDEPENDENT_AMBULATORY_CARE_PROVIDER_SITE_OTHER): Payer: Medicare HMO

## 2020-12-07 ENCOUNTER — Other Ambulatory Visit: Payer: Self-pay

## 2020-12-07 ENCOUNTER — Encounter: Payer: Self-pay | Admitting: Surgery

## 2020-12-07 ENCOUNTER — Ambulatory Visit: Payer: Medicare HMO | Admitting: Primary Care

## 2020-12-07 VITALS — BP 110/70 | HR 85 | Temp 97.9°F | Ht 60.0 in | Wt 117.8 lb

## 2020-12-07 DIAGNOSIS — N183 Chronic kidney disease, stage 3 unspecified: Secondary | ICD-10-CM | POA: Diagnosis not present

## 2020-12-07 DIAGNOSIS — Z01811 Encounter for preprocedural respiratory examination: Secondary | ICD-10-CM

## 2020-12-07 DIAGNOSIS — J441 Chronic obstructive pulmonary disease with (acute) exacerbation: Secondary | ICD-10-CM | POA: Diagnosis not present

## 2020-12-07 DIAGNOSIS — Z8669 Personal history of other diseases of the nervous system and sense organs: Secondary | ICD-10-CM | POA: Diagnosis not present

## 2020-12-07 DIAGNOSIS — J431 Panlobular emphysema: Secondary | ICD-10-CM | POA: Diagnosis not present

## 2020-12-07 DIAGNOSIS — R69 Illness, unspecified: Secondary | ICD-10-CM | POA: Diagnosis not present

## 2020-12-07 DIAGNOSIS — J449 Chronic obstructive pulmonary disease, unspecified: Secondary | ICD-10-CM | POA: Diagnosis not present

## 2020-12-07 DIAGNOSIS — Z9981 Dependence on supplemental oxygen: Secondary | ICD-10-CM | POA: Diagnosis not present

## 2020-12-07 DIAGNOSIS — K5792 Diverticulitis of intestine, part unspecified, without perforation or abscess without bleeding: Secondary | ICD-10-CM | POA: Diagnosis not present

## 2020-12-07 DIAGNOSIS — I482 Chronic atrial fibrillation, unspecified: Secondary | ICD-10-CM | POA: Diagnosis not present

## 2020-12-07 DIAGNOSIS — Z01818 Encounter for other preprocedural examination: Secondary | ICD-10-CM | POA: Diagnosis not present

## 2020-12-07 DIAGNOSIS — J9611 Chronic respiratory failure with hypoxia: Secondary | ICD-10-CM | POA: Diagnosis not present

## 2020-12-07 DIAGNOSIS — Z23 Encounter for immunization: Secondary | ICD-10-CM

## 2020-12-07 DIAGNOSIS — Z7901 Long term (current) use of anticoagulants: Secondary | ICD-10-CM | POA: Diagnosis not present

## 2020-12-07 DIAGNOSIS — N321 Vesicointestinal fistula: Secondary | ICD-10-CM | POA: Diagnosis not present

## 2020-12-07 HISTORY — DX: Personal history of other diseases of the nervous system and sense organs: Z86.69

## 2020-12-07 MED ORDER — SPIRIVA RESPIMAT 2.5 MCG/ACT IN AERS: 2.0000 | INHALATION_SPRAY | Freq: Every day | RESPIRATORY_TRACT | 3 refills | Status: DC

## 2020-12-07 MED ORDER — SPIRIVA RESPIMAT 2.5 MCG/ACT IN AERS
2.0000 | INHALATION_SPRAY | Freq: Every day | RESPIRATORY_TRACT | 0 refills | Status: AC
Start: 2020-12-07 — End: ?

## 2020-12-07 NOTE — Progress Notes (Signed)
Spoke with pt's daughter Cristela Blue she voices understanding. Pt's daughter [facility]) did not have any further questions.

## 2020-12-07 NOTE — Assessment & Plan Note (Addendum)
-   Pulmonary function testing in March 2021 showed moderately severe obstructive airways disease with air trapping.  She is minimally symptomatic. No recent exacerbations. She is not on maintenance inhaler for COPD. Recommend adding Spiriva Respimat 2 puffs once daily as bronchodilator regimen. Strongly encourage smoking cessation. She received high dose influenza vaccine today. Needs follow-up in 3 months.

## 2020-12-07 NOTE — Progress Notes (Signed)
$'@Patient'r$  ID: Tara [name], female    DOB: [birth year], 70 y.o.   MRN: [id number]  Chief Complaint  Patient presents with   Follow-up    Patient reports that she needs surgical clearance for her intestine surgery.      Referring provider: Neale Burly, MD  HPI: 70 year old female, current someday smoker.  Past medical history significant for COPD, OSA, pleural effusion, aspiration pneumonia, chronic respiratory failure, diastolic heart failure, afib, hyperlipidemia, hypothyroidism.   12/07/2020- Interim hx  Patient was seen by PCCM, Dr. Halford Chessman, during an admission back in April 2022 for acute respiratory failure secondary to COPD exacerbation. She presents today for surgical risk assessment, she planning on GI surgery (colovesical fistula repair) with Dr. Johney Maine - date to be determined. She is not on a maintenance inhaler for COPD. She rarely requires levalbuterol nebulizer. She has a chronic cough in the mornings, no purulence. No respiratory infections in the last month. She gets tired walking, she often has to stop d/t fatigue and not shortness of breath. She is not limited doing activities at home. She wears 2L oxygen chronically. She uses POC during the day and wears oxygen at night.  She is not currently on CPAP, has not used this in several years. She is still smoking 1/2-1 ppd. She is seeing cardiology this week for clearance.   Pulmonmary function testing: 06/03/19 - FVC 1.96 (71%), FEV1 1.15 (55%), ratio 59, TLC 138% Moderately severe obstructive airway disease with air trapping    Allergies  Allergen Reactions   Codeine Nausea And Vomiting    Patient reports that it makes her nauseated.     Immunization History  Administered Date(s) Administered   Fluad Quad(high Dose 65+) 12/07/2020   Moderna Sars-Covid-2 Vaccination 05/16/2019, 06/15/2019, 02/13/2020    Past Medical History:  Diagnosis Date   CHF (congestive heart failure) (HCC)    COPD (chronic obstructive  pulmonary disease) ([place])    Diabetes mellitus without complication ([place])    Hypertension     Tobacco History: Social History   Tobacco Use  Smoking Status Some Days   Packs/day: 0.50   Types: Cigarettes  Smokeless Tobacco Never   Ready to quit: Not Answered Counseling given: Not Answered   Outpatient Medications Prior to Visit  Medication Sig Dispense Refill   aspirin EC 81 MG tablet Take 81 mg by mouth daily. Swallow whole.     atorvastatin (LIPITOR) 20 MG tablet Take 20 mg by mouth daily.     diltiazem (CARDIZEM SR) 120 MG 12 hr capsule Take 120 mg by mouth daily.     ELIQUIS 5 MG TABS tablet Take 5 mg by mouth 2 (two) times daily. 2.5 mg bid per daughter.     EUTHYROX 50 MCG tablet Take 50 mcg by mouth daily.     furosemide (LASIX) 20 MG tablet Take 1 tablet (20 mg total) by mouth daily. 30 tablet 11   levalbuterol (XOPENEX) 0.63 MG/3ML nebulizer solution Take 0.63 mg by nebulization every 4 (four) hours as needed for wheezing or shortness of breath.     LORazepam (ATIVAN) 0.5 MG tablet Take 0.5 mg by mouth 2 (two) times daily.     Omega-3 Fatty Acids (FISH OIL) 1000 MG CAPS Take 1 capsule by mouth 3 (three) times daily.     pantoprazole (PROTONIX) 40 MG tablet Take 1 tablet by mouth daily.     potassium chloride (KLOR-CON) 20 MEQ packet Take 20 mEq by mouth daily.     tiZANidine (  ZANAFLEX) 4 MG tablet Take 4 mg by mouth at bedtime.     VITAMIN D PO Take by mouth. 10 mcg 2 qam     ciprofloxacin (CIPRO) 500 MG tablet Take by mouth.     metroNIDAZOLE (FLAGYL) 500 MG tablet Take 1 tablet (500 mg total) by mouth 2 (two) times daily. 20 tablet 0   sulfamethoxazole-trimethoprim (BACTRIM DS) 800-160 MG tablet Take 1 tablet by mouth 2 (two) times daily. 20 tablet 0   digoxin (LANOXIN) 0.125 MG tablet Take 125 mcg by mouth daily. (Patient not taking: Reported on 12/07/2020)     diltiazem (CARDIZEM CD) 120 MG 24 hr capsule Take 120 mg by mouth daily. (Patient not taking: Reported on  12/07/2020)     No facility-administered medications prior to visit.    Review of Systems  Review of Systems  Constitutional:  Positive for fatigue.  HENT: Negative.    Respiratory:  Positive for cough. Negative for chest tightness, shortness of breath and wheezing.   Cardiovascular: Negative.     Physical Exam  BP 110/70 (BP Location: Left Arm, Patient Position: Sitting, Cuff Size: Normal)   Pulse 85   Temp 97.9 F (36.6 C) (Oral)   Ht 5' (1.524 m)   Wt 117 lb 12.8 oz (53.4 kg)   SpO2 94%   BMI 23.01 kg/m  Physical Exam Constitutional:      Appearance: Normal appearance.  HENT:     Head: Normocephalic and atraumatic.     Mouth/Throat:     Mouth: Mucous membranes are moist.     Pharynx: Oropharynx is clear.     Comments: Mallampati class I Cardiovascular:     Rate and Rhythm: Normal rate and regular rhythm.  Pulmonary:     Effort: Pulmonary effort is normal.     Breath sounds: Normal breath sounds. No wheezing, rhonchi or rales.     Comments: CTA Musculoskeletal:        General: Normal range of motion.     Comments: Amb with cane   Skin:    General: Skin is warm and dry.  Neurological:     General: No focal deficit present.     Mental Status: She is alert and oriented to person, place, and time. Mental status is at baseline.  Psychiatric:        Mood and Affect: Mood normal.        Behavior: Behavior normal.        Thought Content: Thought content normal.        Judgment: Judgment normal.     Lab Results:  CBC    Component Value Date/Time   WBC 5.9 11/11/2020 1828   RBC 3.19 (L) 11/11/2020 1828   HGB 10.4 (L) 11/11/2020 1828   HCT 32.3 (L) 11/11/2020 1828   PLT 193 11/11/2020 1828   MCV 101.3 (H) 11/11/2020 1828   MCH 32.6 11/11/2020 1828   MCHC 32.2 11/11/2020 1828   RDW 14.2 11/11/2020 1828   LYMPHSABS 1.5 11/11/2020 1828   MONOABS 0.5 11/11/2020 1828   EOSABS 0.2 11/11/2020 1828   BASOSABS 0.1 11/11/2020 1828    BMET    Component Value  Date/Time   NA 135 11/11/2020 1828   K 3.8 11/11/2020 1828   CL 97 (L) 11/11/2020 1828   CO2 31 11/11/2020 1828   GLUCOSE 88 11/11/2020 1828   BUN 28 (H) 11/11/2020 1828   CREATININE 1.87 (H) 11/11/2020 1828   CALCIUM 9.3 11/11/2020 1828   GFRNONAA 29 (L)  11/11/2020 1828   GFRAA  08/09/2007 1226    >60        The eGFR has been calculated using the MDRD equation. This calculation has not been validated in all clinical    BNP    Component Value Date/Time   BNP 453.0 (H) 07/03/2020 1215    ProBNP No results found for: PROBNP  Imaging: CT ABDOMEN PELVIS WO CONTRAST  Result Date: 11/11/2020 CLINICAL DATA:  LEFT lower quadrant abdominal pain, history of diverticulitis with abscess EXAM: CT ABDOMEN AND PELVIS WITHOUT CONTRAST TECHNIQUE: Multidetector CT imaging of the abdomen and pelvis was performed following the standard protocol without IV contrast. Sagittal and coronal MPR images reconstructed from axial data set. Patient drank dilute oral contrast for exam COMPARISON:  07/16/2020 FINDINGS: Lower chest: Nodular density LEFT lower lobe 5 mm diameter unchanged since 06/28/2020. Lung bases otherwise clear. Hepatobiliary: Gallbladder surgically absent.  Liver unremarkable. Pancreas: Normal appearance Spleen: Normal appearance Adrenals/Urinary Tract: Mild thickening of LEFT adrenal gland without mass. Air within urinary bladder. Wall thickening of the LEFT superior urinary bladder. Adrenal glands, kidneys, ureters, and bladder otherwise unremarkable Stomach/Bowel: Low lying cecum in pelvis. Appendix surgically absent. Stomach and small bowel loops unremarkable. Distal colonic diverticulosis. Wall thickening of the sigmoid colon again identified consistent with diverticulitis seen previously. Small focal gas collection identified extraluminal adjacent between the sigmoid colon and the LEFT superior margin of the urinary bladder, could represent sequela of prior diverticular abscess though cannot  exclude fistula to the urinary bladder due to the presence of air within the bladder and wall thickening of the adjacent bladder. Remainder of colon unremarkable. Vascular/Lymphatic: Atherosclerotic calcifications aorta and iliac arteries without aneurysm. No adenopathy. Reproductive: Uterus surgically absent with nonvisualization of ovaries Other: No free intraperitoneal air or fluid.  No hernia. Musculoskeletal: Prior lumbar fusion L4-L5. Degenerative disc disease changes thoracolumbar spine. IMPRESSION: Persistent wall thickening of the sigmoid colon consistent with diverticulitis. Small focal gas collection identified adjacent to the sigmoid colon and the LEFT superior margin of the urinary bladder, could represent sequela of prior diverticular abscess though cannot exclude fistula to the urinary bladder due to the presence of air within the bladder and wall thickening of the adjacent superior LEFT bladder wall. Aortic Atherosclerosis (ICD10-I70.0). Findings discussed with Dr. Laural Golden on 11/11/2020 at 1037 hours. Electronically Signed   By: Lavonia Dana M.D.   On: 11/11/2020 10:37     Assessment & Plan:   Moderate COPD (chronic obstructive pulmonary disease) ([place]) - Pulmonary function testing in March 2021 showed moderately severe obstructive airways disease with air trapping.  She is minimally symptomatic. No recent exacerbations. She is not on maintenance inhaler for COPD. Recommend adding Spiriva Respimat 2 puffs once daily as bronchodilator regimen. Strongly encourage smoking cessation. She received high dose influenza vaccine today. Needs follow-up in 3 months.   Chronic respiratory failure with hypoxia (HCC) - Chronically on 2L oxygen 24/7  - Ambulatory walk test today on 2L showed no evidence of oxygen desaturations, she was able to maintain O2 92%    Pre-operative respiratory examination - Patient will be considered intermediate to high risk for prolonged mechanical ventilation and/or post-op  pulmonary complications d/t patient's hx moderate-severe COPD, chronic respiratory failure and documented history OSA not on CPAP. She is optimized for surgery from pulmonary standpoint. We will check pre-op CXR today and will be are starting maintenance bronchodilator regimen. Ultimate clearance will be decided upon by surgeon and anesthesiologist.   Major Pulmonary risks identified in the  multifactorial risk analysis are but not limited to a) pneumonia; b) recurrent intubation risk; c) prolonged or recurrent acute respiratory failure needing mechanical ventilation; d) prolonged hospitalization; e) DVT/Pulmonary embolism; f) Acute Pulmonary edema  Recommend 1. Short duration of surgery as much as possible and avoid paralytic if possible 2. Recovery in step down or ICU with Pulmonary consultation if needed  3. DVT prophylaxis 4. Aggressive pulmonary toilet with o2, bronchodilatation, and incentive spirometry and early ambulation     1) RISK FOR PROLONGED MECHANICAL VENTILAION - > 48h  1A) Arozullah - Prolonged mech ventilation risk Arozullah Postperative Pulmonary Risk Score - for mech ventilation dependence >48h [employer], [facility] 2000, major non-cardiac surgery) Comment Score  Type of surgery - abd ao aneurysm (27), thoracic (21), neurosurgery / upper abdominal / vascular (21), neck (11) GI surgery/colostomy  21  Emergency Surgery - (11)  0  ALbumin < 3 or poor nutritional state - (9)  0  BUN > 30 -  (8)  0  Partial or completely dependent functional status - (7)  0  COPD -  (6)  6  Age - 60 to 70 (4), > 70  (6)  4  TOTAL  31  Risk Stratifcation scores  - < 10 (0.5%), 11-19 (1.8%), 20-27 (4.2%), 28-40 (10.1%), >40 (26.6%)  10.1% risk mech ventilation > 48 hours      1B) GUPTA - Prolonged Mech Vent Risk Score source Risk  Guptal post op prolonged mech ventilation > 48h or reintubation < 30 days - ACS 2007-2008 dataset -  http://lewis-perez.info/ 4.8 % Risk of postoperative pneumonia    2) RISK FOR POST OP PNEUMONIA Score source Risk  Lyndel Safe - Post Op Pnemounia risk  [employer] 3.4 % Risk of postoperative pneumonia    R3) ISK FOR ANY POST-OP PULMONARY COMPLICATION Score source Risk  CANET/ARISCAT Score - risk for ANY/ALl pulmonary complications - > risk of in-hospital post-op pulmonary complications (composite including respiratory failure, respiratory infection, pleural effusion, atelectasis, pneumothorax, bronchospasm, aspiration pneumonitis) [employer] - based on age, anemia, pulse ox, resp infection prior 30d, incision site, duration of surgery, and emergency v elective surgery Intermediate risk 13.3% risk of in-hospital post-op pulmonary complications (composite including respiratory failure, respiratory infection, pleural effusion, atelectasis, pneumothorax, bronchospasm, aspiration pneumonitis)     Martyn Ehrich, NP 12/07/2020

## 2020-12-07 NOTE — Assessment & Plan Note (Signed)
-   Chronically on 2L oxygen 24/7  - Ambulatory walk test today on 2L showed no evidence of oxygen desaturations, she was able to maintain O2 92%

## 2020-12-07 NOTE — Progress Notes (Signed)
Please let patient know CXR showed clear lungs, mild bronchial thickening likely d.t chronic bronchitis/COPD. No acute process.

## 2020-12-07 NOTE — Assessment & Plan Note (Signed)
-   Patient will be considered intermediate to high risk for prolonged mechanical ventilation and/or post-op pulmonary complications d/t patient's hx moderate-severe COPD, chronic respiratory failure and documented history OSA not on CPAP. She is optimized for surgery from pulmonary standpoint. We will check pre-op CXR today and will be are starting maintenance bronchodilator regimen. Ultimate clearance will be decided upon by surgeon and anesthesiologist.   Major Pulmonary risks identified in the multifactorial risk analysis are but not limited to a) pneumonia; b) recurrent intubation risk; c) prolonged or recurrent acute respiratory failure needing mechanical ventilation; d) prolonged hospitalization; e) DVT/Pulmonary embolism; f) Acute Pulmonary edema  Recommend 1. Short duration of surgery as much as possible and avoid paralytic if possible 2. Recovery in step down or ICU with Pulmonary consultation if needed  3. DVT prophylaxis 4. Aggressive pulmonary toilet with o2, bronchodilatation, and incentive spirometry and early ambulation

## 2020-12-07 NOTE — Patient Instructions (Addendum)
You will be considered intermediate to high risk for prolonged mechanical ventilation and/or post-op pulmonary complications. Ultimate clearance will be decided by your surgeon   Post-op recommend early ambulation, use incentive spirometer every hour and wear compression stockings   Recommendations: Start Spiriva respimat - take 2 puffs in the morning every day  Continue to wear 2L oxygen 24/7   Orders: Pre op CXR today Ambulatory walk on 2L to assess for oxygen desaturation  High dose flu shot today   Follow-up: 3 months with Dr. Halford Chessman

## 2020-12-07 NOTE — Progress Notes (Signed)
Reviewed and agree with assessment/plan.   Chesley Mires, MD [facility] Pulmonary/Critical Care 12/07/2020, 1:54 PM Pager:  [phone]

## 2020-12-09 NOTE — Telephone Encounter (Signed)
OV notes and surgicial clearance form faxed to [facility], confirmation received. Nothing further needed at this time.

## 2020-12-10 DIAGNOSIS — N1832 Chronic kidney disease, stage 3b: Secondary | ICD-10-CM | POA: Diagnosis not present

## 2020-12-10 DIAGNOSIS — E785 Hyperlipidemia, unspecified: Secondary | ICD-10-CM | POA: Diagnosis not present

## 2020-12-10 DIAGNOSIS — I25118 Atherosclerotic heart disease of native coronary artery with other forms of angina pectoris: Secondary | ICD-10-CM | POA: Diagnosis not present

## 2020-12-10 DIAGNOSIS — I480 Paroxysmal atrial fibrillation: Secondary | ICD-10-CM | POA: Diagnosis not present

## 2020-12-10 DIAGNOSIS — I5032 Chronic diastolic (congestive) heart failure: Secondary | ICD-10-CM | POA: Diagnosis not present

## 2020-12-10 DIAGNOSIS — I10 Essential (primary) hypertension: Secondary | ICD-10-CM | POA: Diagnosis not present

## 2020-12-12 DIAGNOSIS — J449 Chronic obstructive pulmonary disease, unspecified: Secondary | ICD-10-CM | POA: Diagnosis not present

## 2020-12-15 ENCOUNTER — Other Ambulatory Visit: Payer: Self-pay | Admitting: *Deleted

## 2020-12-15 ENCOUNTER — Telehealth: Payer: Self-pay | Admitting: *Deleted

## 2020-12-15 NOTE — Telephone Encounter (Signed)
Started to do a PA for Spiriva Respimat 2.5, however, she has not tried/failed any other inhalers.  I called and spoke with patient's daughter Wyline Beady [facility]) and advised that her mom would need to call her insurance company an find out what is on the preferred list that is similar to Spiriva respimat 2.5.  Advised she can either call us back at the office or send [place] a mychart message and let us know what is covered so the NP can prescribe something else.  She verbalized understanding.  Will await her return call.

## 2020-12-16 ENCOUNTER — Telehealth: Payer: Self-pay

## 2020-12-16 NOTE — Telephone Encounter (Signed)
  Medication name and strength: Spiriva Respimat Ordering Provider: Geraldo Pitter Np  Was PA started with [facility]?: yes  If yes, please enter KEY: [id number] Medication tried and failed: duoneb,albuterol, proventil   PA sent to plan, time frame for approval / denial: waiting for response Routing to [facility] triage for follow-up

## 2020-12-16 NOTE — Telephone Encounter (Signed)
PA approved will contact patient's pharmacy on file.[place] aware Nothing further needed.

## 2020-12-17 DIAGNOSIS — Z1231 Encounter for screening mammogram for malignant neoplasm of breast: Secondary | ICD-10-CM | POA: Diagnosis not present

## 2020-12-25 DIAGNOSIS — I25118 Atherosclerotic heart disease of native coronary artery with other forms of angina pectoris: Secondary | ICD-10-CM | POA: Diagnosis not present

## 2020-12-25 DIAGNOSIS — I2510 Atherosclerotic heart disease of native coronary artery without angina pectoris: Secondary | ICD-10-CM | POA: Diagnosis not present

## 2020-12-25 DIAGNOSIS — Z01810 Encounter for preprocedural cardiovascular examination: Secondary | ICD-10-CM | POA: Diagnosis not present

## 2020-12-25 DIAGNOSIS — Z8679 Personal history of other diseases of the circulatory system: Secondary | ICD-10-CM | POA: Diagnosis not present

## 2020-12-25 DIAGNOSIS — R079 Chest pain, unspecified: Secondary | ICD-10-CM | POA: Diagnosis not present

## 2020-12-30 ENCOUNTER — Ambulatory Visit: Payer: Self-pay | Admitting: Surgery

## 2020-12-30 DIAGNOSIS — R739 Hyperglycemia, unspecified: Secondary | ICD-10-CM

## 2021-01-04 DIAGNOSIS — E1121 Type 2 diabetes mellitus with diabetic nephropathy: Secondary | ICD-10-CM | POA: Diagnosis not present

## 2021-01-04 DIAGNOSIS — I10 Essential (primary) hypertension: Secondary | ICD-10-CM | POA: Diagnosis not present

## 2021-01-04 DIAGNOSIS — N1832 Chronic kidney disease, stage 3b: Secondary | ICD-10-CM | POA: Diagnosis not present

## 2021-01-12 DIAGNOSIS — J449 Chronic obstructive pulmonary disease, unspecified: Secondary | ICD-10-CM | POA: Diagnosis not present

## 2021-01-13 DIAGNOSIS — E1151 Type 2 diabetes mellitus with diabetic peripheral angiopathy without gangrene: Secondary | ICD-10-CM | POA: Diagnosis not present

## 2021-01-13 DIAGNOSIS — D6869 Other thrombophilia: Secondary | ICD-10-CM | POA: Diagnosis not present

## 2021-01-13 DIAGNOSIS — I509 Heart failure, unspecified: Secondary | ICD-10-CM | POA: Diagnosis not present

## 2021-01-13 DIAGNOSIS — E039 Hypothyroidism, unspecified: Secondary | ICD-10-CM | POA: Diagnosis not present

## 2021-01-13 DIAGNOSIS — R69 Illness, unspecified: Secondary | ICD-10-CM | POA: Diagnosis not present

## 2021-01-13 DIAGNOSIS — I700 Atherosclerosis of aorta: Secondary | ICD-10-CM | POA: Diagnosis not present

## 2021-01-13 DIAGNOSIS — J449 Chronic obstructive pulmonary disease, unspecified: Secondary | ICD-10-CM | POA: Diagnosis not present

## 2021-01-13 DIAGNOSIS — E785 Hyperlipidemia, unspecified: Secondary | ICD-10-CM | POA: Diagnosis not present

## 2021-01-13 DIAGNOSIS — I4891 Unspecified atrial fibrillation: Secondary | ICD-10-CM | POA: Diagnosis not present

## 2021-01-13 DIAGNOSIS — I110 Hypertensive heart disease with heart failure: Secondary | ICD-10-CM | POA: Diagnosis not present

## 2021-01-27 NOTE — Patient Instructions (Addendum)
DUE TO COVID-19 ONLY ONE VISITOR IS ALLOWED TO COME WITH YOU AND STAY IN THE WAITING ROOM ONLY DURING PRE OP AND PROCEDURE DAY OF SURGERY IF YOU ARE GOING HOME AFTER SURGERY. IF YOU ARE SPENDING THE NIGHT 2 PEOPLE MAY VISIT WITH YOU IN YOUR PRIVATE ROOM AFTER SURGERY UNTIL VISITING  HOURS ARE OVER AT 800 PM AND 1  VISITOR  MAY  SPEND THE NIGHT.   YOU NEED TO HAVE A COVID 19 TEST ON__12/5___THIS TEST MUST BE DONE BEFORE SURGERY,  COVID TESTING SITE  IS LOCATED AT [place], [place]. REMAIN IN YOUR CAR THIS IS A DRIVE UP TEST. AFTER YOUR COVID TEST PLEASE WEAR A MASK OUT IN PUBLIC AND SOCIAL DISTANCE AND [place] YOUR HANDS FREQUENTLY, ALSO ASK ALL YOUR CLOSE CONTACT PERSONS TO WEAR A MASK AND SOCIAL DISTANCE AND [place] THEIR HANDS FREQUENTLY ALSO.               Sung Amabile     Your procedure is scheduled on: 02/10/21   Report to [facility] Main  Entrance   Report to admitting at 10:45 AM     Call this number if you have problems the morning of surgery [phone]   Follow all instructions for bowel prep  and diet restrictions from Dr. Clyda Greener office  Drink plenty of liquids on day of prep to prevent dehydration   DRINK 2 PRESURGERY ENSURE DRINKS THE NIGHT BEFORE SURGERY AT 10:00 PM .   NO SOLIDS AFTER MIDNIGHT THE DAY PRIOR TO THE SURGERY.   NOTHING BY MOUTH EXCEPT CLEAR LIQUIDS UNTIL 10:00 am   PLEASE FINISH PRESURGERY ENSURE DRINK PER SURGEON ORDER 3 HOURS PRIOR TO SCHEDULED SURGERY TIME WHICH NEEDS TO BE COMPLETED AT 10:00 AM   CLEAR LIQUID DIET   Foods Allowed                                                                     Foods Excluded  Coffee and tea, regular and decaf                             liquids that you cannot  Plain Jell-O any favor except red or purple                                           see through such as: Fruit ices (not with fruit pulp)                                     milk, soups, orange juice  Iced Popsicles                                     All solid food Carbonated beverages, regular and diet  Cranberry, grape and apple juices Sports drinks like Gatorade Lightly seasoned clear broth or consume(fat free) Sugar    BRUSH YOUR TEETH MORNING OF SURGERY AND RINSE YOUR MOUTH OUT, NO CHEWING GUM CANDY OR MINTS.     Take these medicines the morning of surgery with A SIP OF WATER: Use your inhalers and bring them with you to the hospital  Take Diltiazem, Euthyrox, Pantoprazole .         Bring your mask and tubing to the hospital  Stop taking _ASA 81 mg__________on ___12/4_______as instructed by Dr. Gross_____________.  Stop taking _Eliquis___________as directed by your Surgeon/Cardiologist.  Contact your Surgeon/Cardiologist for instructions on Anticoagulant Therapy prior to surgery.    DO NOT TAKE ANY DIABETIC MEDICATIONS DAY OF YOUR SURGERY                               You may not have any metal on your body including hair pins and              piercings  Do not wear jewelry, make-up, lotions, powders or perfumes, deodorant             Do not wear nail polish on your fingernails.  Do not shave  48 hours prior to surgery.                 Do not bring valuables to the hospital. [place].  Contacts, dentures or bridgework may not be worn into surgery.  Leave suitcase in the car. After surgery it may be brought to your room.                 [place] - Preparing for Surgery Before surgery, you can play an important role.  Because skin is not sterile, your skin needs to be as free of germs as possible.  You can reduce the number of germs on your skin by washing with CHG (chlorahexidine gluconate) soap before surgery.  CHG is an antiseptic cleaner which kills germs and bonds with the skin to continue killing germs even after washing. Please DO NOT use if you have an allergy to CHG or antibacterial soaps.  If  your skin becomes reddened/irritated stop using the CHG and inform your nurse when you arrive at Short Stay. Do not shave (including legs and underarms) for at least 48 hours prior to the first CHG shower.  Please follow these instructions carefully:  1.  Shower with CHG Soap the night before surgery and the  morning of Surgery.  2.  If you choose to wash your hair, wash your hair first as usual with your  normal  shampoo.  3.  After you shampoo, rinse your hair and body thoroughly to remove the  shampoo.                            4.  Use CHG as you would any other liquid soap.  You can apply chg directly  to the skin and wash                       Gently with a scrungie or clean washcloth.  5.  Apply the CHG Soap to your body ONLY FROM THE NECK DOWN.   Do not use  on face/ open                           Wound or open sores. Avoid contact with eyes, ears mouth and genitals (private parts).                       Wash face,  Genitals (private parts) with your normal soap.             6.  Wash thoroughly, paying special attention to the area where your surgery  will be performed.  7.  Thoroughly rinse your body with warm water from the neck down.  8.  DO NOT shower/wash with your normal soap after using and rinsing off  the CHG Soap.                9.  Pat yourself dry with a clean towel.            10.  Wear clean pajamas.            11.  Place clean sheets on your bed the night of your first shower and do not  sleep with pets. Day of Surgery : Do not apply any lotions/deodorants the morning of surgery.  Please wear clean clothes to the hospital/surgery center.  FAILURE TO FOLLOW THESE INSTRUCTIONS MAY RESULT IN THE CANCELLATION OF YOUR SURGERY PATIENT SIGNATURE_________________________________  NURSE SIGNATURE__________________________________  ________________________________________________________________________   Adam Phenix  An incentive spirometer is a tool that can help  keep your lungs clear and active. This tool measures how well you are filling your lungs with each breath. Taking long deep breaths may help reverse or decrease the chance of developing breathing (pulmonary) problems (especially infection) following: A long period of time when you are unable to move or be active. BEFORE THE PROCEDURE  If the spirometer includes an indicator to show your best effort, your nurse or respiratory therapist will set it to a desired goal. If possible, sit up straight or lean slightly forward. Try not to slouch. Hold the incentive spirometer in an upright position. INSTRUCTIONS FOR USE  Sit on the edge of your bed if possible, or sit up as far as you can in bed or on a chair. Hold the incentive spirometer in an upright position. Breathe out normally. Place the mouthpiece in your mouth and seal your lips tightly around it. Breathe in slowly and as deeply as possible, raising the piston or the ball toward the top of the column. Hold your breath for 3-5 seconds or for as long as possible. Allow the piston or ball to fall to the bottom of the column. Remove the mouthpiece from your mouth and breathe out normally. Rest for a few seconds and repeat Steps 1 through 7 at least 10 times every 1-2 hours when you are awake. Take your time and take a few normal breaths between deep breaths. The spirometer may include an indicator to show your best effort. Use the indicator as a goal to work toward during each repetition. After each set of 10 deep breaths, practice coughing to be sure your lungs are clear. If you have an incision (the cut made at the time of surgery), support your incision when coughing by placing a pillow or rolled up towels firmly against it. Once you are able to get out of bed, walk around indoors and cough well. You may stop using the incentive spirometer when instructed by  your caregiver.  RISKS AND COMPLICATIONS Take your time so you do not get dizzy or  light-headed. If you are in pain, you may need to take or ask for pain medication before doing incentive spirometry. It is harder to take a deep breath if you are having pain. AFTER USE Rest and breathe slowly and easily. It can be helpful to keep track of a log of your progress. Your caregiver can provide you with a simple table to help with this. If you are using the spirometer at home, follow these instructions: [place] IF:  You are having difficultly using the spirometer. You have trouble using the spirometer as often as instructed. Your pain medication is not giving enough relief while using the spirometer. You develop fever of 100.5 F (38.1 C) or higher. SEEK IMMEDIATE MEDICAL CARE IF:  You cough up bloody sputum that had not been present before. You develop fever of 102 F (38.9 C) or greater. You develop worsening pain at or near the incision site. MAKE SURE YOU:  Understand these instructions. Will watch your condition. Will get help right away if you are not doing well or get worse. Document Released: 07/04/2006 Document Revised: 05/16/2011 Document Reviewed: 09/04/2006 [facility] Patient Information 2014 [place], [place].   ________________________________________________________________________

## 2021-02-03 DIAGNOSIS — I10 Essential (primary) hypertension: Secondary | ICD-10-CM | POA: Diagnosis not present

## 2021-02-03 DIAGNOSIS — J449 Chronic obstructive pulmonary disease, unspecified: Secondary | ICD-10-CM | POA: Diagnosis not present

## 2021-02-03 DIAGNOSIS — N1832 Chronic kidney disease, stage 3b: Secondary | ICD-10-CM | POA: Diagnosis not present

## 2021-02-03 DIAGNOSIS — I700 Atherosclerosis of aorta: Secondary | ICD-10-CM | POA: Diagnosis not present

## 2021-02-03 DIAGNOSIS — E1121 Type 2 diabetes mellitus with diabetic nephropathy: Secondary | ICD-10-CM | POA: Diagnosis not present

## 2021-02-04 ENCOUNTER — Encounter (HOSPITAL_COMMUNITY)
Admission: RE | Admit: 2021-02-04 | Discharge: 2021-02-04 | Disposition: A | Discharge status: Home / Self Care | Payer: Medicare HMO | Source: Ambulatory Visit | Attending: Surgery | Admitting: Surgery

## 2021-02-04 ENCOUNTER — Other Ambulatory Visit: Payer: Self-pay

## 2021-02-04 ENCOUNTER — Encounter (HOSPITAL_COMMUNITY): Payer: Self-pay

## 2021-02-04 VITALS — BP 114/68 | HR 75 | Temp 98.1°F | Resp 20 | Ht 61.0 in | Wt 118.0 lb

## 2021-02-04 DIAGNOSIS — G4730 Sleep apnea, unspecified: Secondary | ICD-10-CM | POA: Diagnosis not present

## 2021-02-04 DIAGNOSIS — F17210 Nicotine dependence, cigarettes, uncomplicated: Secondary | ICD-10-CM | POA: Insufficient documentation

## 2021-02-04 DIAGNOSIS — R69 Illness, unspecified: Secondary | ICD-10-CM | POA: Diagnosis not present

## 2021-02-04 DIAGNOSIS — R739 Hyperglycemia, unspecified: Secondary | ICD-10-CM

## 2021-02-04 DIAGNOSIS — Z01812 Encounter for preprocedural laboratory examination: Secondary | ICD-10-CM | POA: Insufficient documentation

## 2021-02-04 DIAGNOSIS — I4891 Unspecified atrial fibrillation: Secondary | ICD-10-CM | POA: Insufficient documentation

## 2021-02-04 DIAGNOSIS — E1122 Type 2 diabetes mellitus with diabetic chronic kidney disease: Secondary | ICD-10-CM | POA: Insufficient documentation

## 2021-02-04 DIAGNOSIS — N321 Vesicointestinal fistula: Secondary | ICD-10-CM | POA: Insufficient documentation

## 2021-02-04 DIAGNOSIS — I509 Heart failure, unspecified: Secondary | ICD-10-CM | POA: Diagnosis not present

## 2021-02-04 DIAGNOSIS — N183 Chronic kidney disease, stage 3 unspecified: Secondary | ICD-10-CM | POA: Diagnosis not present

## 2021-02-04 DIAGNOSIS — J449 Chronic obstructive pulmonary disease, unspecified: Secondary | ICD-10-CM | POA: Insufficient documentation

## 2021-02-04 DIAGNOSIS — E119 Type 2 diabetes mellitus without complications: Secondary | ICD-10-CM

## 2021-02-04 DIAGNOSIS — I10 Essential (primary) hypertension: Secondary | ICD-10-CM

## 2021-02-04 HISTORY — DX: Hypothyroidism, unspecified: E03.9

## 2021-02-04 HISTORY — DX: Prediabetes: R73.03

## 2021-02-04 HISTORY — DX: Unspecified osteoarthritis, unspecified site: M19.90

## 2021-02-04 LAB — BASIC METABOLIC PANEL
Anion gap: 5 (ref 5–15)
BUN: 25 mg/dL — ABNORMAL HIGH (ref 8–23)
CO2: 34 mmol/L — ABNORMAL HIGH (ref 22–32)
Calcium: 9.2 mg/dL (ref 8.9–10.3)
Chloride: 98 mmol/L (ref 98–111)
Creatinine, Ser: 1.82 mg/dL — ABNORMAL HIGH (ref 0.44–1.00)
GFR, Estimated: 30 mL/min — ABNORMAL LOW (ref >60)
Glucose, Bld: 87 mg/dL (ref 70–99)
Potassium: 3.7 mmol/L (ref 3.5–5.1)
Sodium: 137 mmol/L (ref 135–145)

## 2021-02-04 LAB — CBC
HCT: 36.9 % (ref 36.0–46.0)
Hemoglobin: 11.7 g/dL — ABNORMAL LOW (ref 12.0–15.0)
MCH: 32.3 pg (ref 26.0–34.0)
MCHC: 31.7 g/dL (ref 30.0–36.0)
MCV: 101.9 fL — ABNORMAL HIGH (ref 80.0–100.0)
Platelets: 183 10*3/uL (ref 150–400)
RBC: 3.62 MIL/uL — ABNORMAL LOW (ref 3.87–5.11)
RDW: 13.9 % (ref 11.5–15.5)
WBC: 5.3 10*3/uL (ref 4.0–10.5)
nRBC: 0 % (ref 0.0–0.2)

## 2021-02-04 LAB — HEMOGLOBIN A1C
Hgb A1c MFr Bld: 5.8 % — ABNORMAL HIGH (ref 4.8–5.6)
Mean Plasma Glucose: 119.76 mg/dL

## 2021-02-04 LAB — GLUCOSE, CAPILLARY: Glucose-Capillary: 185 mg/dL — ABNORMAL HIGH (ref 70–99)

## 2021-02-04 NOTE — Consult Note (Signed)
[place] Nurse requested for preoperative stoma site marking  Discussed surgical procedure and stoma creation with patient and family.  Explained role of the [facility] nurse team.  Provided the patient with educational booklet and provided samples of pouching options.  Answered patient and family questions.   Examined patient sitting and standing in order to place the marking in the patient's visual field, away from any creases or abdominal contour issues and within the rectus muscle.  Attempted to mark below the patient's belt line.   Marked for colostomy in the LLQ  __4__ cm to the left of the umbilicus and __6__DY below the umbilicus.  Marked for ileostomy in the RLQ  __5__cm to the right of the umbilicus and  __7__ cm below the umbilicus.   Patient's abdomen cleansed with CHG wipes at site markings, allowed to air dry prior to marking.Covered mark with thin film transparent dressing to preserve mark until date of surgery.   [place] Nurse team will follow up with patient after surgery for continue ostomy care and teaching.  [place] MSN, [place], [place], [place]

## 2021-02-04 NOTE — Progress Notes (Signed)
COVID test-12/5    PCP - Dr. Celso Sickle Cardiologist - Dr. Chauncey Cruel. [place] Pulmonologist- Dr. Governor Rooks  Chest x-ray - 12/07/20-epic EKG - 12/10/20-CE, 07/16/20-epic Stress Test - 10/21-22- CE ECHO - 07/04/20-epic Cardiac Cath - no Pacemaker/ICD device last checked:NA  Sleep Study - no CPAP -   Fasting Blood Sugar - Pt is diet controlled diabetic after a significant weight loss Checks Blood Sugar _____ times a day  Blood Thinner Instructions:Eliquis and ASA 81 mg/ Dr. Candis Musa Aspirin Instructions:Hold eliquis 2 days prior to DOS/ Gross Last Dose:02/07/21. Pt's daughter will call Dr. Clyda Greener office to verify when to stop the ASA and about the bowel prep.  Anesthesia review: yes  Patient denies shortness of breath, fever, cough and chest pain at PAT appointment Pt has SOB with most activities. She uses O2 all the time and her gait is unsteady. Her daughter is her care giver.  Patient verbalized understanding of instructions that were given to them at the PAT appointment. Patient was also instructed that they will need to review over the PAT instructions again at home before surgery. yes

## 2021-02-05 NOTE — Progress Notes (Signed)
Anesthesia Chart Review   Case: [id number] Date/Time: 02/10/21 1245   Procedures:      ROBOTIC RESECTION OF COLON, RECTOSIGMOID, POSSIBLE BLADDER REPAIR     POSSIBLE OSTOMY     RIGID PROCTOSCOPY   Anesthesia type: General   Pre-op diagnosis: COLOVESICAL FISTULA   Location: [facility] ROOM 02 / [facility]   Surgeons: Michael Boston, MD       DISCUSSION:70 y.o. some day smoker with h/o COPD, CHF, sleep apnea, afib, CKD Stage III, colovesical fistula scheduled for above procedure 02/10/21 with Dr. Michael Boston.   Pt seen by pulmonologist for preop evaluation. Per OV note, "Patient will be considered intermediate to high risk for prolonged mechanical ventilation and/or post-op pulmonary complications d/t patient's hx moderate-severe COPD, chronic respiratory failure and documented history OSA not on CPAP. She is optimized for surgery from pulmonary standpoint. We will check pre-op CXR today and will be are starting maintenance bronchodilator regimen. Ultimate clearance will be decided upon by surgeon and anesthesiologist.    Major Pulmonary risks identified in the multifactorial risk analysis are but not limited to a) pneumonia; b) recurrent intubation risk; c) prolonged or recurrent acute respiratory failure needing mechanical ventilation; d) prolonged hospitalization; e) DVT/Pulmonary embolism; f) Acute Pulmonary edema   Recommend 1. Short duration of surgery as much as possible and avoid paralytic if possible 2. Recovery in step down or ICU with Pulmonary consultation if needed  3. DVT prophylaxis 4. Aggressive pulmonary toilet with o2, bronchodilatation, and incentive spirometry and early ambulation"  Per cardiology note 12/29/2020, "Low risk study, continue medical therapy as per most recent office note. Moderate but likely non-prohibitive risk for proposed surgical procedure, will leave to discretion of surgical team and patient to proceed."  Anticipate pt can proceed with planned procedure barring  acute status change.   VS: BP 114/68   Pulse 75   Temp 36.7 C (Oral)   Resp 20   Ht 5\' 1"  (1.549 m)   Wt 53.5 kg   SpO2 98%   BMI 22.30 kg/m   PROVIDERS: Neale Burly, MD is PCP    LABS: Labs reviewed: Acceptable for surgery. (all labs ordered are listed, but only abnormal results are displayed)  Labs Reviewed  GLUCOSE, CAPILLARY - Abnormal; Notable for the following components:      Result Value   Glucose-Capillary 185 (*)    All other components within normal limits  BASIC METABOLIC PANEL - Abnormal; Notable for the following components:   CO2 34 (*)    BUN 25 (*)    Creatinine, Ser 1.82 (*)    GFR, Estimated 30 (*)    All other components within normal limits  CBC - Abnormal; Notable for the following components:   RBC 3.62 (*)    Hemoglobin 11.7 (*)    MCV 101.9 (*)    All other components within normal limits  HEMOGLOBIN A1C - Abnormal; Notable for the following components:   Hgb A1c MFr Bld 5.8 (*)    All other components within normal limits  TYPE AND SCREEN     IMAGES:   EKG: 07/15/20 Rate 50 bpm  Sinus rhythm  Borderline right axis deviation  Minimal ST elevation, anterior leads Baseline wander leads V6  CV: Stress Test 12/25/2020 1. No reversible ischemia or infarction.   2. Normal left ventricular wall motion.   3. Left ventricular ejection fraction 63%   4. Non invasive risk stratification*: Low   Echo 07/04/2020  1. Left ventricular ejection fraction, by  estimation, is 65 to 70%. The  left ventricle has normal function. The left ventricle has no regional  wall motion abnormalities. Left ventricular diastolic parameters are  consistent with Grade I diastolic  dysfunction (impaired relaxation).   2. Right ventricular systolic function is normal. The right ventricular  size is normal. There is normal pulmonary artery systolic pressure. The  estimated right ventricular systolic pressure is 28.3 mmHg.   3. The mitral valve is abnormal.  Trivial mitral valve regurgitation.   4. The aortic valve is tricuspid. Aortic valve regurgitation is not  visualized.   5. The inferior vena cava is dilated in size with >50% respiratory  variability, suggesting right atrial pressure of 8 mmHg.  Past Medical History:  Diagnosis Date   Arthritis    back   CHF (congestive heart failure) (HCC)    COPD (chronic obstructive pulmonary disease) (HCC)    Hx of sleep apnea 12/07/2020   Hypothyroidism    Pre-diabetes     Past Surgical History:  Procedure Laterality Date   ABDOMINAL HYSTERECTOMY  1985   BSO and appy   BACK SURGERY  2006   rod and screws   CHOLECYSTECTOMY  1995   TUBAL LIGATION  1977    MEDICATIONS:  aspirin EC 81 MG tablet   atorvastatin (LIPITOR) 20 MG tablet   Cholecalciferol (VITAMIN D3) 10 MCG (400 UNIT) CAPS   diltiazem (CARDIZEM CD) 120 MG 24 hr capsule   ELIQUIS 5 MG TABS tablet   EUTHYROX 50 MCG tablet   furosemide (LASIX) 20 MG tablet   Krill Oil 1000 MG CAPS   levalbuterol (XOPENEX) 0.63 MG/3ML nebulizer solution   LORazepam (ATIVAN) 0.5 MG tablet   Omega-3 Fatty Acids (FISH OIL) 1000 MG CAPS   pantoprazole (PROTONIX) 40 MG tablet   potassium chloride SA (KLOR-CON) 20 MEQ tablet   Tiotropium Bromide Monohydrate (SPIRIVA RESPIMAT) 2.5 MCG/ACT AERS   Tiotropium Bromide Monohydrate (SPIRIVA RESPIMAT) 2.5 MCG/ACT AERS   tiZANidine (ZANAFLEX) 4 MG capsule   No current facility-administered medications for this encounter.     Konrad Felix Ward, PA-C [facility] Pre-Surgical Testing [phone]

## 2021-02-08 ENCOUNTER — Other Ambulatory Visit: Payer: Self-pay | Admitting: Surgery

## 2021-02-09 LAB — SARS CORONAVIRUS 2 (TAT 6-24 HRS): SARS Coronavirus 2: NEGATIVE

## 2021-02-10 ENCOUNTER — Encounter (HOSPITAL_COMMUNITY)
Admission: RE | Disposition: A | Discharge status: Home Health | Payer: Self-pay | Source: Home / Self Care | Attending: Surgery

## 2021-02-10 ENCOUNTER — Encounter (HOSPITAL_COMMUNITY): Payer: Self-pay | Admitting: Surgery

## 2021-02-10 ENCOUNTER — Inpatient Hospital Stay (HOSPITAL_COMMUNITY)
Admission: RE | Admit: 2021-02-10 | Discharge: 2021-02-15 | DRG: 981 | Disposition: A | Discharge status: Home Health | Payer: Medicare HMO | Attending: Surgery | Admitting: Surgery

## 2021-02-10 ENCOUNTER — Inpatient Hospital Stay (HOSPITAL_COMMUNITY): Payer: Medicare HMO | Admitting: Physician Assistant

## 2021-02-10 ENCOUNTER — Inpatient Hospital Stay (HOSPITAL_COMMUNITY): Payer: Medicare HMO | Admitting: Certified Registered Nurse Anesthetist

## 2021-02-10 DIAGNOSIS — Z9981 Dependence on supplemental oxygen: Secondary | ICD-10-CM

## 2021-02-10 DIAGNOSIS — D638 Anemia in other chronic diseases classified elsewhere: Secondary | ICD-10-CM | POA: Diagnosis not present

## 2021-02-10 DIAGNOSIS — K5720 Diverticulitis of large intestine with perforation and abscess without bleeding: Secondary | ICD-10-CM | POA: Diagnosis present

## 2021-02-10 DIAGNOSIS — Z79890 Hormone replacement therapy (postmenopausal): Secondary | ICD-10-CM

## 2021-02-10 DIAGNOSIS — F419 Anxiety disorder, unspecified: Secondary | ICD-10-CM | POA: Diagnosis present

## 2021-02-10 DIAGNOSIS — E039 Hypothyroidism, unspecified: Secondary | ICD-10-CM | POA: Diagnosis not present

## 2021-02-10 DIAGNOSIS — N183 Chronic kidney disease, stage 3 unspecified: Secondary | ICD-10-CM | POA: Diagnosis not present

## 2021-02-10 DIAGNOSIS — J9611 Chronic respiratory failure with hypoxia: Secondary | ICD-10-CM | POA: Diagnosis present

## 2021-02-10 DIAGNOSIS — R32 Unspecified urinary incontinence: Secondary | ICD-10-CM | POA: Diagnosis present

## 2021-02-10 DIAGNOSIS — G4730 Sleep apnea, unspecified: Secondary | ICD-10-CM | POA: Diagnosis present

## 2021-02-10 DIAGNOSIS — D62 Acute posthemorrhagic anemia: Secondary | ICD-10-CM | POA: Diagnosis not present

## 2021-02-10 DIAGNOSIS — N1832 Chronic kidney disease, stage 3b: Secondary | ICD-10-CM | POA: Diagnosis present

## 2021-02-10 DIAGNOSIS — Z885 Allergy status to narcotic agent status: Secondary | ICD-10-CM

## 2021-02-10 DIAGNOSIS — Z7982 Long term (current) use of aspirin: Secondary | ICD-10-CM | POA: Diagnosis not present

## 2021-02-10 DIAGNOSIS — Z79899 Other long term (current) drug therapy: Secondary | ICD-10-CM | POA: Diagnosis not present

## 2021-02-10 DIAGNOSIS — R7303 Prediabetes: Secondary | ICD-10-CM | POA: Diagnosis present

## 2021-02-10 DIAGNOSIS — Z20822 Contact with and (suspected) exposure to covid-19: Secondary | ICD-10-CM | POA: Diagnosis present

## 2021-02-10 DIAGNOSIS — R918 Other nonspecific abnormal finding of lung field: Secondary | ICD-10-CM | POA: Diagnosis not present

## 2021-02-10 DIAGNOSIS — K660 Peritoneal adhesions (postprocedural) (postinfection): Secondary | ICD-10-CM | POA: Diagnosis present

## 2021-02-10 DIAGNOSIS — I130 Hypertensive heart and chronic kidney disease with heart failure and stage 1 through stage 4 chronic kidney disease, or unspecified chronic kidney disease: Secondary | ICD-10-CM | POA: Diagnosis not present

## 2021-02-10 DIAGNOSIS — Z7901 Long term (current) use of anticoagulants: Secondary | ICD-10-CM

## 2021-02-10 DIAGNOSIS — F17210 Nicotine dependence, cigarettes, uncomplicated: Secondary | ICD-10-CM | POA: Diagnosis present

## 2021-02-10 DIAGNOSIS — Z8249 Family history of ischemic heart disease and other diseases of the circulatory system: Secondary | ICD-10-CM | POA: Diagnosis not present

## 2021-02-10 DIAGNOSIS — J449 Chronic obstructive pulmonary disease, unspecified: Secondary | ICD-10-CM | POA: Diagnosis present

## 2021-02-10 DIAGNOSIS — N321 Vesicointestinal fistula: Secondary | ICD-10-CM | POA: Diagnosis not present

## 2021-02-10 DIAGNOSIS — I482 Chronic atrial fibrillation, unspecified: Secondary | ICD-10-CM | POA: Diagnosis present

## 2021-02-10 DIAGNOSIS — I5032 Chronic diastolic (congestive) heart failure: Secondary | ICD-10-CM | POA: Diagnosis not present

## 2021-02-10 DIAGNOSIS — K56600 Partial intestinal obstruction, unspecified as to cause: Secondary | ICD-10-CM | POA: Diagnosis not present

## 2021-02-10 DIAGNOSIS — K651 Peritoneal abscess: Secondary | ICD-10-CM | POA: Diagnosis not present

## 2021-02-10 DIAGNOSIS — I509 Heart failure, unspecified: Secondary | ICD-10-CM | POA: Diagnosis not present

## 2021-02-10 DIAGNOSIS — Z9049 Acquired absence of other specified parts of digestive tract: Secondary | ICD-10-CM | POA: Diagnosis not present

## 2021-02-10 HISTORY — PX: OSTOMY: SHX5997

## 2021-02-10 HISTORY — PX: PROCTOSCOPY: SHX2266

## 2021-02-10 LAB — MRSA NEXT GEN BY PCR, NASAL: MRSA by PCR Next Gen: NOT DETECTED

## 2021-02-10 LAB — GLUCOSE, CAPILLARY
Glucose-Capillary: 84 mg/dL (ref 70–99)
Glucose-Capillary: 130 mg/dL — ABNORMAL HIGH (ref 70–99)

## 2021-02-10 SURGERY — COLECTOMY, SIGMOID, ROBOT-ASSISTED
Anesthesia: General | Site: Abdomen

## 2021-02-10 MED ORDER — DEXAMETHASONE SODIUM PHOSPHATE 10 MG/ML IJ SOLN
INTRAMUSCULAR | Status: DC | PRN
  Administered 2021-02-10: 10 mg via INTRAVENOUS

## 2021-02-10 MED ORDER — SUGAMMADEX SODIUM 200 MG/2ML IV SOLN
INTRAVENOUS | Status: DC | PRN
  Administered 2021-02-10: 200 mg via INTRAVENOUS

## 2021-02-10 MED ORDER — NEOMYCIN SULFATE 500 MG PO TABS: 1000.0000 mg | ORAL_TABLET | ORAL | Status: DC

## 2021-02-10 MED ORDER — MIDAZOLAM HCL 2 MG/2ML IJ SOLN
INTRAMUSCULAR | Status: AC
  Filled 2021-02-10: qty 2

## 2021-02-10 MED ORDER — ACETAMINOPHEN 500 MG PO TABS
1000.0000 mg | ORAL_TABLET | Freq: Four times a day (QID) | ORAL | Status: DC
  Administered 2021-02-11 – 2021-02-15 (×17): 1000 mg via ORAL
  Filled 2021-02-10 (×18): qty 2

## 2021-02-10 MED ORDER — ORAL CARE MOUTH RINSE: 15.0000 mL | Freq: Once | OROMUCOSAL | Status: AC

## 2021-02-10 MED ORDER — MELATONIN 3 MG PO TABS: 3.0000 mg | ORAL_TABLET | Freq: Every evening | ORAL | Status: DC | PRN

## 2021-02-10 MED ORDER — 0.9 % SODIUM CHLORIDE (POUR BTL) OPTIME
TOPICAL | Status: DC | PRN
  Administered 2021-02-10: 2000 mL

## 2021-02-10 MED ORDER — ROCURONIUM BROMIDE 100 MG/10ML IV SOLN
INTRAVENOUS | Status: DC | PRN
  Administered 2021-02-10: 10 mg via INTRAVENOUS
  Administered 2021-02-10: 20 mg via INTRAVENOUS
  Administered 2021-02-10: 60 mg via INTRAVENOUS

## 2021-02-10 MED ORDER — ENSURE SURGERY PO LIQD
237.0000 mL | Freq: Two times a day (BID) | ORAL | Status: DC
  Administered 2021-02-11 – 2021-02-15 (×8): 237 mL via ORAL
  Filled 2021-02-10 (×8): qty 237

## 2021-02-10 MED ORDER — ONDANSETRON HCL 4 MG/2ML IJ SOLN
INTRAMUSCULAR | Status: DC | PRN
  Administered 2021-02-10: 4 mg via INTRAVENOUS

## 2021-02-10 MED ORDER — DIPHENHYDRAMINE HCL 50 MG/ML IJ SOLN: 12.5000 mg | Freq: Four times a day (QID) | INTRAMUSCULAR | Status: DC | PRN

## 2021-02-10 MED ORDER — ACETAMINOPHEN 500 MG PO TABS: 1000.0000 mg | ORAL_TABLET | ORAL | Status: DC

## 2021-02-10 MED ORDER — ONDANSETRON HCL 4 MG/2ML IJ SOLN
INTRAMUSCULAR | Status: AC
  Filled 2021-02-10: qty 2

## 2021-02-10 MED ORDER — METOPROLOL TARTRATE 5 MG/5ML IV SOLN: 5.0000 mg | Freq: Four times a day (QID) | INTRAVENOUS | Status: DC | PRN

## 2021-02-10 MED ORDER — POLYETHYLENE GLYCOL 3350 17 GM/SCOOP PO POWD: 1.0000 | Freq: Once | ORAL | Status: DC

## 2021-02-10 MED ORDER — LIP MEDEX EX OINT
1.0000 "application " | TOPICAL_OINTMENT | Freq: Two times a day (BID) | CUTANEOUS | Status: DC
  Administered 2021-02-10 – 2021-02-15 (×10): 1 via TOPICAL
  Filled 2021-02-10 (×5): qty 7

## 2021-02-10 MED ORDER — LORAZEPAM 0.5 MG PO TABS
0.5000 mg | ORAL_TABLET | Freq: Two times a day (BID) | ORAL | Status: DC
  Administered 2021-02-11 – 2021-02-15 (×9): 0.5 mg via ORAL
  Filled 2021-02-10 (×9): qty 1

## 2021-02-10 MED ORDER — METHOCARBAMOL 500 MG PO TABS
1000.0000 mg | ORAL_TABLET | Freq: Four times a day (QID) | ORAL | Status: DC | PRN
  Administered 2021-02-12: 1000 mg via ORAL
  Filled 2021-02-10: qty 2

## 2021-02-10 MED ORDER — ENSURE PRE-SURGERY PO LIQD
296.0000 mL | Freq: Once | ORAL | Status: DC
  Filled 2021-02-10: qty 296

## 2021-02-10 MED ORDER — OMEGA-3-ACID ETHYL ESTERS 1 G PO CAPS
1000.0000 mg | ORAL_CAPSULE | Freq: Every day | ORAL | Status: DC
  Administered 2021-02-11 – 2021-02-12 (×2): 1000 mg via ORAL
  Filled 2021-02-10 (×4): qty 1

## 2021-02-10 MED ORDER — GABAPENTIN 300 MG PO CAPS
300.0000 mg | ORAL_CAPSULE | ORAL | Status: AC
  Administered 2021-02-10: 300 mg via ORAL
  Filled 2021-02-10: qty 1

## 2021-02-10 MED ORDER — ACETAMINOPHEN 500 MG PO TABS
1000.0000 mg | ORAL_TABLET | Freq: Once | ORAL | Status: AC
  Administered 2021-02-10: 1000 mg via ORAL
  Filled 2021-02-10: qty 2

## 2021-02-10 MED ORDER — PROCHLORPERAZINE EDISYLATE 10 MG/2ML IJ SOLN: 5.0000 mg | Freq: Four times a day (QID) | INTRAMUSCULAR | Status: DC | PRN

## 2021-02-10 MED ORDER — ALVIMOPAN 12 MG PO CAPS
12.0000 mg | ORAL_CAPSULE | Freq: Two times a day (BID) | ORAL | Status: DC
  Filled 2021-02-10: qty 1

## 2021-02-10 MED ORDER — ENOXAPARIN SODIUM 30 MG/0.3ML IJ SOSY: 30.0000 mg | PREFILLED_SYRINGE | INTRAMUSCULAR | Status: DC

## 2021-02-10 MED ORDER — PHENYLEPHRINE HCL (PRESSORS) 10 MG/ML IV SOLN
INTRAVENOUS | Status: AC
  Filled 2021-02-10: qty 1

## 2021-02-10 MED ORDER — ORAL CARE MOUTH RINSE
15.0000 mL | Freq: Two times a day (BID) | OROMUCOSAL | Status: DC
  Administered 2021-02-11 – 2021-02-15 (×8): 15 mL via OROMUCOSAL

## 2021-02-10 MED ORDER — LIDOCAINE HCL (PF) 2 % IJ SOLN
INTRAMUSCULAR | Status: AC
  Filled 2021-02-10: qty 5

## 2021-02-10 MED ORDER — HYDROMORPHONE HCL 1 MG/ML IJ SOLN: 0.5000 mg | INTRAMUSCULAR | Status: DC | PRN

## 2021-02-10 MED ORDER — LACTATED RINGERS IV SOLN: INTRAVENOUS | Status: DC

## 2021-02-10 MED ORDER — BUPIVACAINE LIPOSOME 1.3 % IJ SUSP: 20.0000 mL | Freq: Once | INTRAMUSCULAR | Status: DC

## 2021-02-10 MED ORDER — PROPOFOL 10 MG/ML IV BOLUS
INTRAVENOUS | Status: AC
  Filled 2021-02-10: qty 20

## 2021-02-10 MED ORDER — ROCURONIUM BROMIDE 10 MG/ML (PF) SYRINGE
PREFILLED_SYRINGE | INTRAVENOUS | Status: AC
  Filled 2021-02-10: qty 10

## 2021-02-10 MED ORDER — ENSURE PRE-SURGERY PO LIQD
592.0000 mL | Freq: Once | ORAL | Status: DC
  Filled 2021-02-10: qty 592

## 2021-02-10 MED ORDER — FENTANYL CITRATE (PF) 250 MCG/5ML IJ SOLN
INTRAMUSCULAR | Status: AC
  Filled 2021-02-10: qty 5

## 2021-02-10 MED ORDER — SODIUM CHLORIDE 0.9 % IV SOLN
2.0000 g | INTRAVENOUS | Status: AC
  Administered 2021-02-10: 2 g via INTRAVENOUS
  Filled 2021-02-10: qty 2

## 2021-02-10 MED ORDER — CHLORHEXIDINE GLUCONATE 0.12 % MT SOLN
15.0000 mL | Freq: Once | OROMUCOSAL | Status: AC
  Administered 2021-02-10: 15 mL via OROMUCOSAL

## 2021-02-10 MED ORDER — ENALAPRILAT 1.25 MG/ML IV SOLN
0.6250 mg | Freq: Four times a day (QID) | INTRAVENOUS | Status: DC | PRN
Start: 2021-02-10 — End: 2021-02-15
  Filled 2021-02-10: qty 1

## 2021-02-10 MED ORDER — BUPIVACAINE-EPINEPHRINE (PF) 0.25% -1:200000 IJ SOLN
INTRAMUSCULAR | Status: DC | PRN
  Administered 2021-02-10: 60 mL

## 2021-02-10 MED ORDER — ALVIMOPAN 12 MG PO CAPS
12.0000 mg | ORAL_CAPSULE | ORAL | Status: AC
  Administered 2021-02-10: 12 mg via ORAL

## 2021-02-10 MED ORDER — GLYCOPYRROLATE 0.2 MG/ML IJ SOLN
INTRAMUSCULAR | Status: DC | PRN
  Administered 2021-02-10: .1 mg via INTRAVENOUS

## 2021-02-10 MED ORDER — PHENYLEPHRINE HCL-NACL 20-0.9 MG/250ML-% IV SOLN
INTRAVENOUS | Status: DC | PRN
  Administered 2021-02-10: 30 ug/min via INTRAVENOUS

## 2021-02-10 MED ORDER — POTASSIUM CHLORIDE CRYS ER 20 MEQ PO TBCR
20.0000 meq | EXTENDED_RELEASE_TABLET | Freq: Every day | ORAL | Status: DC
  Administered 2021-02-11 – 2021-02-14 (×4): 20 meq via ORAL
  Filled 2021-02-10 (×4): qty 1

## 2021-02-10 MED ORDER — CHLORHEXIDINE GLUCONATE CLOTH 2 % EX PADS
6.0000 | MEDICATED_PAD | Freq: Every day | CUTANEOUS | Status: DC
  Administered 2021-02-10 – 2021-02-15 (×6): 6 via TOPICAL

## 2021-02-10 MED ORDER — BUPIVACAINE LIPOSOME 1.3 % IJ SUSP
INTRAMUSCULAR | Status: AC
  Filled 2021-02-10: qty 20

## 2021-02-10 MED ORDER — CHOLECALCIFEROL 10 MCG (400 UNIT) PO TABS
800.0000 [IU] | ORAL_TABLET | Freq: Every day | ORAL | Status: DC
  Administered 2021-02-11 – 2021-02-15 (×5): 800 [IU] via ORAL
  Filled 2021-02-10 (×7): qty 2

## 2021-02-10 MED ORDER — BUPIVACAINE-EPINEPHRINE (PF) 0.25% -1:200000 IJ SOLN
INTRAMUSCULAR | Status: AC
  Filled 2021-02-10: qty 60

## 2021-02-10 MED ORDER — ONDANSETRON HCL 4 MG PO TABS: 4.0000 mg | ORAL_TABLET | Freq: Four times a day (QID) | ORAL | Status: DC | PRN

## 2021-02-10 MED ORDER — DIPHENHYDRAMINE HCL 12.5 MG/5ML PO ELIX: 12.5000 mg | ORAL_SOLUTION | Freq: Four times a day (QID) | ORAL | Status: DC | PRN

## 2021-02-10 MED ORDER — METHOCARBAMOL 1000 MG/10ML IJ SOLN: 1000.0000 mg | Freq: Four times a day (QID) | INTRAVENOUS | Status: DC | PRN

## 2021-02-10 MED ORDER — PHENYLEPHRINE 40 MCG/ML (10ML) SYRINGE FOR IV PUSH (FOR BLOOD PRESSURE SUPPORT)
PREFILLED_SYRINGE | INTRAVENOUS | Status: DC | PRN
  Administered 2021-02-10: 80 ug via INTRAVENOUS
  Administered 2021-02-10 (×2): 160 ug via INTRAVENOUS

## 2021-02-10 MED ORDER — PROPOFOL 10 MG/ML IV BOLUS
INTRAVENOUS | Status: DC | PRN
  Administered 2021-02-10: 100 mg via INTRAVENOUS

## 2021-02-10 MED ORDER — PANTOPRAZOLE SODIUM 40 MG PO TBEC
40.0000 mg | DELAYED_RELEASE_TABLET | Freq: Every morning | ORAL | Status: DC
  Administered 2021-02-11 – 2021-02-15 (×5): 40 mg via ORAL
  Filled 2021-02-10 (×5): qty 1

## 2021-02-10 MED ORDER — TIZANIDINE HCL 4 MG PO TABS
4.0000 mg | ORAL_TABLET | Freq: Every day | ORAL | Status: DC
  Administered 2021-02-11 – 2021-02-14 (×4): 4 mg via ORAL
  Filled 2021-02-10 (×4): qty 1

## 2021-02-10 MED ORDER — UMECLIDINIUM BROMIDE 62.5 MCG/ACT IN AEPB
1.0000 | INHALATION_SPRAY | Freq: Every day | RESPIRATORY_TRACT | Status: DC
Start: 2021-02-10 — End: 2021-02-13
  Administered 2021-02-11 – 2021-02-12 (×2): 1 via RESPIRATORY_TRACT
  Filled 2021-02-10: qty 7

## 2021-02-10 MED ORDER — MIDAZOLAM HCL 5 MG/5ML IJ SOLN
INTRAMUSCULAR | Status: DC | PRN
  Administered 2021-02-10 (×2): 1 mg via INTRAVENOUS

## 2021-02-10 MED ORDER — SPY AGENT GREEN - (INDOCYANINE FOR INJECTION)
INTRAMUSCULAR | Status: DC | PRN
  Administered 2021-02-10: 4 mL via INTRAVENOUS

## 2021-02-10 MED ORDER — ALBUTEROL SULFATE (2.5 MG/3ML) 0.083% IN NEBU: 2.5000 mg | INHALATION_SOLUTION | RESPIRATORY_TRACT | Status: DC | PRN

## 2021-02-10 MED ORDER — ONDANSETRON HCL 4 MG/2ML IJ SOLN: 4.0000 mg | Freq: Four times a day (QID) | INTRAMUSCULAR | Status: DC | PRN

## 2021-02-10 MED ORDER — BUPIVACAINE LIPOSOME 1.3 % IJ SUSP
INTRAMUSCULAR | Status: DC | PRN
  Administered 2021-02-10: 20 mL

## 2021-02-10 MED ORDER — LACTATED RINGERS IV SOLN: INTRAVENOUS | Status: DC | PRN

## 2021-02-10 MED ORDER — METRONIDAZOLE 500 MG PO TABS: 1000.0000 mg | ORAL_TABLET | ORAL | Status: DC

## 2021-02-10 MED ORDER — MAGIC MOUTHWASH
15.0000 mL | Freq: Four times a day (QID) | ORAL | Status: DC | PRN
  Filled 2021-02-10: qty 15

## 2021-02-10 MED ORDER — LACTATED RINGERS IV BOLUS
1000.0000 mL | Freq: Three times a day (TID) | INTRAVENOUS | Status: DC | PRN
  Administered 2021-02-10 – 2021-02-11 (×2): 1000 mL via INTRAVENOUS

## 2021-02-10 MED ORDER — FENTANYL CITRATE PF 50 MCG/ML IJ SOSY: 25.0000 ug | PREFILLED_SYRINGE | INTRAMUSCULAR | Status: DC | PRN

## 2021-02-10 MED ORDER — SIMETHICONE 80 MG PO CHEW
40.0000 mg | CHEWABLE_TABLET | Freq: Four times a day (QID) | ORAL | Status: DC | PRN
  Administered 2021-02-13: 40 mg via ORAL
  Filled 2021-02-10: qty 1

## 2021-02-10 MED ORDER — DILTIAZEM HCL ER COATED BEADS 120 MG PO CP24
120.0000 mg | ORAL_CAPSULE | Freq: Every day | ORAL | Status: DC
  Administered 2021-02-12 – 2021-02-15 (×4): 120 mg via ORAL
  Filled 2021-02-10 (×5): qty 1

## 2021-02-10 MED ORDER — BISACODYL 5 MG PO TBEC: 20.0000 mg | DELAYED_RELEASE_TABLET | Freq: Once | ORAL | Status: DC

## 2021-02-10 MED ORDER — DEXAMETHASONE SODIUM PHOSPHATE 10 MG/ML IJ SOLN
INTRAMUSCULAR | Status: AC
  Filled 2021-02-10: qty 1

## 2021-02-10 MED ORDER — TRAMADOL HCL 50 MG PO TABS
50.0000 mg | ORAL_TABLET | Freq: Four times a day (QID) | ORAL | Status: DC | PRN
  Administered 2021-02-12: 100 mg via ORAL
  Filled 2021-02-10: qty 2

## 2021-02-10 MED ORDER — ALUM & MAG HYDROXIDE-SIMETH 200-200-20 MG/5ML PO SUSP: 30.0000 mL | Freq: Four times a day (QID) | ORAL | Status: DC | PRN

## 2021-02-10 MED ORDER — SODIUM CHLORIDE 0.9 % IV SOLN
2.0000 g | Freq: Two times a day (BID) | INTRAVENOUS | Status: AC
  Administered 2021-02-10: 2 g via INTRAVENOUS
  Filled 2021-02-10: qty 2

## 2021-02-10 MED ORDER — FENTANYL CITRATE (PF) 100 MCG/2ML IJ SOLN
INTRAMUSCULAR | Status: DC | PRN
  Administered 2021-02-10 (×3): 50 ug via INTRAVENOUS

## 2021-02-10 MED ORDER — ENOXAPARIN SODIUM 40 MG/0.4ML IJ SOSY
40.0000 mg | PREFILLED_SYRINGE | Freq: Once | INTRAMUSCULAR | Status: AC
  Administered 2021-02-10: 40 mg via SUBCUTANEOUS
  Filled 2021-02-10: qty 0.4

## 2021-02-10 MED ORDER — METHYLENE BLUE 0.5 % INJ SOLN
INTRAVENOUS | Status: AC
  Filled 2021-02-10: qty 10

## 2021-02-10 MED ORDER — LIDOCAINE HCL (PF) 2 % IJ SOLN
INTRAMUSCULAR | Status: DC | PRN
  Administered 2021-02-10: 1.5 mg/kg/h via INTRADERMAL

## 2021-02-10 MED ORDER — LACTATED RINGERS IV SOLN
INTRAVENOUS | Status: DC
  Administered 2021-02-10 (×2): 50 mL/h via INTRAVENOUS

## 2021-02-10 MED ORDER — ALBUTEROL SULFATE HFA 108 (90 BASE) MCG/ACT IN AERS
INHALATION_SPRAY | RESPIRATORY_TRACT | Status: DC | PRN
  Administered 2021-02-10 (×2): 5 via RESPIRATORY_TRACT

## 2021-02-10 MED ORDER — LEVOTHYROXINE SODIUM 50 MCG PO TABS
50.0000 ug | ORAL_TABLET | Freq: Every day | ORAL | Status: DC
  Administered 2021-02-11 – 2021-02-15 (×5): 50 ug via ORAL
  Filled 2021-02-10 (×5): qty 1

## 2021-02-10 MED ORDER — LACTATED RINGERS IR SOLN
Status: DC | PRN
  Administered 2021-02-10: 1000 mL

## 2021-02-10 MED ORDER — ASPIRIN EC 81 MG PO TBEC
81.0000 mg | DELAYED_RELEASE_TABLET | Freq: Every day | ORAL | Status: DC
  Administered 2021-02-11 – 2021-02-14 (×4): 81 mg via ORAL
  Filled 2021-02-10 (×5): qty 1

## 2021-02-10 MED ORDER — CHLORHEXIDINE GLUCONATE 0.12 % MT SOLN
15.0000 mL | Freq: Two times a day (BID) | OROMUCOSAL | Status: DC
  Administered 2021-02-10 – 2021-02-15 (×10): 15 mL via OROMUCOSAL
  Filled 2021-02-10 (×10): qty 15

## 2021-02-10 MED ORDER — LIDOCAINE HCL (CARDIAC) PF 100 MG/5ML IV SOSY
PREFILLED_SYRINGE | INTRAVENOUS | Status: DC | PRN
  Administered 2021-02-10: 40 mg via INTRAVENOUS

## 2021-02-10 MED ORDER — ALBUMIN HUMAN 5 % IV SOLN
12.5000 g | Freq: Once | INTRAVENOUS | Status: AC
  Administered 2021-02-10: 12.5 g via INTRAVENOUS
  Filled 2021-02-10: qty 250

## 2021-02-10 MED ORDER — PROCHLORPERAZINE MALEATE 10 MG PO TABS
10.0000 mg | ORAL_TABLET | Freq: Four times a day (QID) | ORAL | Status: DC | PRN
  Filled 2021-02-10: qty 1

## 2021-02-10 MED ORDER — CALCIUM POLYCARBOPHIL 625 MG PO TABS
625.0000 mg | ORAL_TABLET | Freq: Two times a day (BID) | ORAL | Status: DC
  Administered 2021-02-11 – 2021-02-15 (×9): 625 mg via ORAL
  Filled 2021-02-10 (×11): qty 1

## 2021-02-10 MED ORDER — EPHEDRINE SULFATE 50 MG/ML IJ SOLN
INTRAMUSCULAR | Status: DC | PRN
  Administered 2021-02-10: 5 mg via INTRAVENOUS

## 2021-02-10 SURGICAL SUPPLY — 115 items
APL PRP STRL LF DISP 70% ISPRP (MISCELLANEOUS)
APPLIER CLIP 5 13 M/L LIGAMAX5 (MISCELLANEOUS)
APPLIER CLIP ROT 10 11.4 M/L (STAPLE)
APR CLP MED LRG 11.4X10 (STAPLE)
APR CLP MED LRG 5 ANG JAW (MISCELLANEOUS)
BAG COUNTER SPONGE SURGICOUNT (BAG) ×3 IMPLANT
BAG SPNG CNTER NS LX DISP (BAG) ×2
BLADE EXTENDED COATED 6.5IN (ELECTRODE) IMPLANT
CANNULA REDUC XI 12-8 STAPL (CANNULA)
CANNULA REDUCER 12-8 DVNC XI (CANNULA) IMPLANT
CELLS DAT CNTRL 66122 CELL SVR (MISCELLANEOUS) IMPLANT
CHLORAPREP W/TINT 26 (MISCELLANEOUS) IMPLANT
CLIP APPLIE 5 13 M/L LIGAMAX5 (MISCELLANEOUS) IMPLANT
CLIP APPLIE ROT 10 11.4 M/L (STAPLE) IMPLANT
COVER SURGICAL LIGHT HANDLE (MISCELLANEOUS) ×6 IMPLANT
COVER TIP SHEARS 8 DVNC (MISCELLANEOUS) ×2 IMPLANT
COVER TIP SHEARS 8MM DA VINCI (MISCELLANEOUS) ×3
DECANTER SPIKE VIAL GLASS SM (MISCELLANEOUS) ×3 IMPLANT
DEVICE TROCAR PUNCTURE CLOSURE (ENDOMECHANICALS) IMPLANT
DRAIN CHANNEL 19F RND (DRAIN) IMPLANT
DRAPE ARM DVNC X/XI (DISPOSABLE) ×8 IMPLANT
DRAPE COLUMN DVNC XI (DISPOSABLE) ×2 IMPLANT
DRAPE DA VINCI XI ARM (DISPOSABLE) ×12
DRAPE DA VINCI XI COLUMN (DISPOSABLE) ×3
DRAPE SURG IRRIG POUCH 19X23 (DRAPES) ×3 IMPLANT
DRSG OPSITE POSTOP 4X10 (GAUZE/BANDAGES/DRESSINGS) IMPLANT
DRSG OPSITE POSTOP 4X6 (GAUZE/BANDAGES/DRESSINGS) ×1 IMPLANT
DRSG OPSITE POSTOP 4X8 (GAUZE/BANDAGES/DRESSINGS) IMPLANT
DRSG TEGADERM 2-3/8X2-3/4 SM (GAUZE/BANDAGES/DRESSINGS) ×15 IMPLANT
DRSG TEGADERM 4X4.75 (GAUZE/BANDAGES/DRESSINGS) IMPLANT
ELECT PENCIL ROCKER SW 15FT (MISCELLANEOUS) ×3 IMPLANT
ELECT REM PT RETURN 15FT ADLT (MISCELLANEOUS) ×3 IMPLANT
ENDOLOOP SUT PDS II  0 18 (SUTURE)
ENDOLOOP SUT PDS II 0 18 (SUTURE) IMPLANT
EVACUATOR SILICONE 100CC (DRAIN) IMPLANT
GAUZE SPONGE 2X2 8PLY STRL LF (GAUZE/BANDAGES/DRESSINGS) ×2 IMPLANT
GLOVE SURG NEOPR MICRO LF SZ8 (GLOVE) ×9 IMPLANT
GLOVE SURG UNDER LTX SZ8 (GLOVE) ×9 IMPLANT
GOWN STRL REUS W/TWL XL LVL3 (GOWN DISPOSABLE) ×9 IMPLANT
GRASPER SUT TROCAR 14GX15 (MISCELLANEOUS) IMPLANT
HOLDER FOLEY CATH W/STRAP (MISCELLANEOUS) ×3 IMPLANT
IRRIG SUCT STRYKERFLOW 2 WTIP (MISCELLANEOUS) ×3
IRRIGATION SUCT STRKRFLW 2 WTP (MISCELLANEOUS) ×2 IMPLANT
KIT PROCEDURE DA VINCI SI (MISCELLANEOUS)
KIT PROCEDURE DVNC SI (MISCELLANEOUS) IMPLANT
KIT SIGMOIDOSCOPE (SET/KITS/TRAYS/PACK) IMPLANT
KIT TURNOVER KIT A (KITS) IMPLANT
NDL INSUFFLATION 14GA 120MM (NEEDLE) ×2 IMPLANT
NEEDLE INSUFFLATION 14GA 120MM (NEEDLE) ×3 IMPLANT
PACK CARDIOVASCULAR III (CUSTOM PROCEDURE TRAY) ×3 IMPLANT
PACK COLON (CUSTOM PROCEDURE TRAY) ×3 IMPLANT
PAD POSITIONING PINK XL (MISCELLANEOUS) ×3 IMPLANT
PROTECTOR NERVE ULNAR (MISCELLANEOUS) ×6 IMPLANT
RELOAD STAPLE 45 3.5 BLU DVNC (STAPLE) IMPLANT
RELOAD STAPLE 45 4.3 GRN DVNC (STAPLE) IMPLANT
RELOAD STAPLE 60 3.5 BLU DVNC (STAPLE) IMPLANT
RELOAD STAPLE 60 4.3 GRN DVNC (STAPLE) IMPLANT
RELOAD STAPLER 3.5X45 BLU DVNC (STAPLE) IMPLANT
RELOAD STAPLER 3.5X60 BLU DVNC (STAPLE) IMPLANT
RELOAD STAPLER 4.3X45 GRN DVNC (STAPLE) IMPLANT
RELOAD STAPLER 4.3X60 GRN DVNC (STAPLE) ×2 IMPLANT
RETRACTOR WND ALEXIS 18 MED (MISCELLANEOUS) IMPLANT
RTRCTR WOUND ALEXIS 18CM MED (MISCELLANEOUS)
SCISSORS LAP 5X35 DISP (ENDOMECHANICALS) ×3 IMPLANT
SEAL CANN UNIV 5-8 DVNC XI (MISCELLANEOUS) ×6 IMPLANT
SEAL XI 5MM-8MM UNIVERSAL (MISCELLANEOUS) ×9
SEALER VESSEL DA VINCI XI (MISCELLANEOUS) ×3
SEALER VESSEL EXT DVNC XI (MISCELLANEOUS) ×2 IMPLANT
SOLUTION ELECTROLUBE (MISCELLANEOUS) ×3 IMPLANT
SPONGE GAUZE 2X2 STER 10/PKG (GAUZE/BANDAGES/DRESSINGS) ×1
STAPLER 45 DA VINCI SURE FORM (STAPLE)
STAPLER 45 SUREFORM DVNC (STAPLE) IMPLANT
STAPLER 60 DA VINCI SURE FORM (STAPLE) ×3
STAPLER 60 SUREFORM DVNC (STAPLE) IMPLANT
STAPLER CANNULA SEAL DVNC XI (STAPLE) ×2 IMPLANT
STAPLER CANNULA SEAL XI (STAPLE) ×3
STAPLER ECHELON POWER CIR 29 (STAPLE) IMPLANT
STAPLER ECHELON POWER CIR 31 (STAPLE) ×1 IMPLANT
STAPLER RELOAD 3.5X45 BLU DVNC (STAPLE)
STAPLER RELOAD 3.5X45 BLUE (STAPLE)
STAPLER RELOAD 3.5X60 BLU DVNC (STAPLE)
STAPLER RELOAD 3.5X60 BLUE (STAPLE)
STAPLER RELOAD 4.3X45 GREEN (STAPLE)
STAPLER RELOAD 4.3X45 GRN DVNC (STAPLE)
STAPLER RELOAD 4.3X60 GREEN (STAPLE) ×3
STAPLER RELOAD 4.3X60 GRN DVNC (STAPLE) ×2
STOPCOCK 4 WAY LG BORE MALE ST (IV SETS) ×6 IMPLANT
SURGILUBE 2OZ TUBE FLIPTOP (MISCELLANEOUS) IMPLANT
SUT MNCRL AB 4-0 PS2 18 (SUTURE) ×3 IMPLANT
SUT PDS AB 1 CT1 27 (SUTURE) ×6 IMPLANT
SUT PROLENE 0 CT 2 (SUTURE) IMPLANT
SUT PROLENE 2 0 KS (SUTURE) IMPLANT
SUT PROLENE 2 0 SH DA (SUTURE) IMPLANT
SUT SILK 2 0 (SUTURE)
SUT SILK 2 0 SH CR/8 (SUTURE) IMPLANT
SUT SILK 2-0 18XBRD TIE 12 (SUTURE) IMPLANT
SUT SILK 3 0 (SUTURE)
SUT SILK 3 0 SH CR/8 (SUTURE) ×3 IMPLANT
SUT SILK 3-0 18XBRD TIE 12 (SUTURE) IMPLANT
SUT V-LOC BARB 180 2/0GR6 GS22 (SUTURE)
SUT VIC AB 3-0 SH 18 (SUTURE) IMPLANT
SUT VIC AB 3-0 SH 27 (SUTURE)
SUT VIC AB 3-0 SH 27XBRD (SUTURE) IMPLANT
SUT VICRYL 0 UR6 27IN ABS (SUTURE) ×3 IMPLANT
SUTURE V-LC BRB 180 2/0GR6GS22 (SUTURE) IMPLANT
SYR 10ML ECCENTRIC (SYRINGE) ×3 IMPLANT
SYS LAPSCP GELPORT 120MM (MISCELLANEOUS)
SYS WOUND ALEXIS 18CM MED (MISCELLANEOUS) ×3
SYSTEM LAPSCP GELPORT 120MM (MISCELLANEOUS) IMPLANT
SYSTEM WOUND ALEXIS 18CM MED (MISCELLANEOUS) ×2 IMPLANT
TOWEL OR NON WOVEN STRL DISP B (DISPOSABLE) ×3 IMPLANT
TRAY FOLEY MTR SLVR 16FR STAT (SET/KITS/TRAYS/PACK) ×3 IMPLANT
TROCAR ADV FIXATION 5X100MM (TROCAR) ×3 IMPLANT
TUBING CONNECTING 10 (TUBING) ×6 IMPLANT
TUBING INSUFFLATION 10FT LAP (TUBING) ×3 IMPLANT

## 2021-02-10 NOTE — H&P (Signed)
REFERRING PHYSICIAN: Saverio Danker, MD  Patient Care Team: Nilsa Nutting, MD as PCP - General (Internal Medicine) Johney Maine, Adrian Saran, MD as Consulting Provider (General Surgery) Assar, Jodelle Heidi Lemay, DO (Cardiovascular Disease) Ryna Beckstrom, Adrian Saran, MD as Consulting Provider (General Surgery) Saverio Danker, MD (Gastroenterology)  PROVIDER: Hollace Kinnier, MD  [facility] MRN: [id number] DOB: Jul 04, 1950   Subjective   Chief Complaint: Follow-up   History of Present Illness: Tara [name] is a 70 y.o. female who is seen today as an office consultation at the request of Dr. 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   Pleasant smoking female. Comes today with her daughter. Has struggled with inflammation of her colon presumed to be diverticulitis. Sounds like she was having some symptoms last winter. However worsened in the spring. Had abdominal pain with irregular bowels and diarrhea. Was diagnosed with abscesses along the sigmoid colon-suspicious for diverticulitis versus colitis. Some narrowing with possible obstruction due to inflammation. Required hospitalization and IV antibiotics. Was on IV antibiotics for a month and then transitioned to oral antibiotics. 6 weeks course. Followed up with gastrology and surgery. Discussion about the need to consider surgery. Developed pneumaturia and urinary tract infection. CT scan showing gas in bladder suspicious for colovesical fistula. Followed up with Dr. Laural Golden with gastroenterology. Surgical consultation with me requested. Completed 10-day course of Bactrim and metronidazole.   Patient comes today with her daughter. She walks with a cane. She is on 2 L of oxygen. She continues to smoke. She claims she is cut down. 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 This past year she has had to focus on her health with her daughter involved. She has had some unintentional weight loss for the past 3 years  but no major weight loss in the past 6 months.. She has had hysterectomy and laparoscopic cholecystectomy many decades ago. She claims to move her bowels usually once a day. Occasionally up to 4 times a day. She is noted some episodes of incontinence and leaking. Still has some pneumaturia. Dark urine. She walks with a cane. She claims she has a lot of chronic back pain and leg pain. She stays active in the house but does not use her treadmill often. She does not recall any prior diverticulitis attacks. However she has required antibiotics for most of 2022. She has had a colonoscopy before that she recalls being told was normal. She thinks this was about 4 years ago in [place]. Other records state that she had one at age 68.  Seen by pulmonary.  Felt to be at least intermediate risk.  However she is struggling with her colovesical fistula recurrent UTIs and I do not think this can be managed without surgery.  We will work to be safe  Medical History: Past Medical History:  Diagnosis Date   Anxiety   Arthritis   COPD (chronic obstructive pulmonary disease) (CMS-HCC)   Thyroid disease   There is no problem list on file for this patient.  Past Surgical History:  Procedure Laterality Date   CHOLECYSTECTOMY   HYSTERECTOMY    Allergies  Allergen Reactions   Codeine Nausea And Vomiting  Patient reports that it makes her nauseated.    Current Outpatient Medications on File Prior to Visit  Medication Sig Dispense Refill   apixaban (ELIQUIS) 5 mg tablet Take by mouth   FUROsemide (LASIX) 20 MG tablet Take 20 mg by mouth once daily   levothyroxine (EUTHYROX) 50  MCG tablet Take by mouth   LORazepam (ATIVAN) 0.5 MG tablet Take 0.5 mg by mouth 2 (two) times daily   acetaZOLAMIDE (DIAMOX) 250 MG tablet Take 250 mg by mouth once daily   albuterol (PROVENTIL) 2.5 mg /3 mL (0.083 %) nebulizer solution Inhale into the lungs every 4 (four) hours as needed   aspirin 81 MG EC tablet Take by mouth    atorvastatin (LIPITOR) 20 MG tablet Take 20 mg by mouth once daily   chlorhexidine (PERIDEX) 0.12 % solution RINSE MOUTH 15 MLS (1 CAPFUL) FOR 30 SECONDS IN THE AM AND PM AFTER TOOTHBRUSHING. EXPECTORATE AFTER RINSING DO NOT SWALLOW   cholecalciferol, vitamin D3, (CHOLECALCIFEROL, VIT D3,,BULK,) 100,000 unit/gram Powd Take by mouth   ciprofloxacin HCl (CIPRO) 500 MG tablet Take 500 mg by mouth 2 (two) times daily   dicyclomine (BENTYL) 20 mg tablet TAKE 1 TABLET BY MOUTH 4 TIMES DAILY AS NEEDED FOR ABDOMINAL CRAMPING / PAIN   digoxin (LANOXIN) 0.125 MG tablet Take 0.125 mg by mouth once daily   diltiazem (CARDIZEM CD) 120 MG XR capsule Take 120 mg by mouth once daily   docosahexaenoic acid-epa 120-180 mg Cap Take 1 capsule by mouth 3 (three) times daily   ertapenem (INVANZ) injection   fluconazole (DIFLUCAN) 150 MG tablet TAKE 1 TABLET BY MOUTH AS A ONE TIME DOSE   ibuprofen (MOTRIN) 600 MG tablet Take 600 mg by mouth every 6 (six) hours as needed   levalbuterol (XOPENEX) 0.63 mg/3 mL nebulizer solution Inhale into the lungs   pantoprazole (PROTONIX) 40 MG DR tablet Take 40 mg by mouth once daily   potassium chloride (K-TAB) 20 mEq TbER ER tablet Take 20 mEq by mouth once daily   tiZANidine (ZANAFLEX) 4 MG capsule Take 4 mg by mouth at bedtime   No current facility-administered medications on file prior to visit.   Family History  Problem Relation Age of Onset   Obesity Mother   High blood pressure (Hypertension) Mother   Hyperlipidemia (Elevated cholesterol) Mother    Social History   Tobacco Use  Smoking Status Smoker, Current Status Unknown  Smokeless Tobacco Never Used    Social History   Socioeconomic History   Marital status: Widowed  Tobacco Use   Smoking status: Smoker, Current Status Unknown   Smokeless tobacco: Never Used  Substance and Sexual Activity   Alcohol use: Never   Drug use: Never   ############################################################  Fraility  Risk:  Preoperative Risks/Screening 1. Frailty Review:  Lives independently? yes Uses a mobility assist device (cane/walker/wheelchair)? yes History of falls within 3 months? no Cognitive impairment/dementia? no Age > 65? yes  2. Nutrition Screening: Cancer/IBD? no Age >65? yes Weight loss >10% in past 6 months? no If yes to any of the 3 above, consider Impact AR supplemental shake  3. PONV Screening: History of PONV? no Female under age of 86? no History of motion sickness? no  4. Chronic pain issues? yes  5. Diabetes? yes Last HgbA1c: No results found for: HGBA1C 5.4   Due to patients above screening and past medical history they are at increased risk of surgery  Review of Systems: A complete review of systems (ROS) was obtained from the patient. I have reviewed this information and discussed as appropriate with the patient. See HPI as well for other pertinent ROS.  Constitutional: No fevers, chills, sweats. Weight stable Eyes: No vision changes, No discharge HENT: No sore throats, nasal drainage Lymph: No neck swelling, No bruising easily Pulmonary:  No cough, productive sputum CV: No orthopnea, PND Patient walks 5-10 minutes with a cane. No exertional chest/neck/shoulder/arm pain +DOE  GI: No personal nor family history of GI/colon cancer, inflammatory bowel disease, irritable bowel syndrome, allergy such as Celiac Sprue, dietary/dairy problems, colitis, ulcers nor gastritis. No recent sick contacts/gastroenteritis. No travel outside the country. No changes in diet.  Renal: No UTIs, No hematuria. +PNEUMATURIA Genital: No drainage, bleeding, masses Musculoskeletal: No severe joint pain. Good ROM major joints Skin: No sores or lesions Heme/Lymph: No easy bleeding. No swollen lymph nodes  Objective:   Vitals:  12/07/20 1149  BP: 110/80  Pulse: 80  Temp: (!) 35.4 C (95.8 F)  SpO2: (!) 90%  Weight: 53.9 kg (118 lb 12.8 oz)  Height: 154.9 cm (5\' 1" )    Body  mass index is 22.45 kg/m.  BP (!) 141/88   Pulse 74   Temp 99.2 F (37.3 C) (Oral)   Resp 16   Ht 5\' 1"  (1.549 m)   Wt 53.5 kg   SpO2 98%   BMI 22.30 kg/m  02/10/2021   PHYSICAL EXAM:  Constitutional: Thin but Not cachectic. Hygeine fair/adequate. Vitals signs as above.  Eyes: Pupils reactive, normal extraocular movements. Sclera nonicteric Neuro: CN II-XII intact. No major focal sensory defects. No major motor deficits. Lymph: No head/neck/groin lymphadenopathy Psych: No severe agitation. No severe anxiety. Judgment & insight Adequate, Oriented x4, HENT: Normocephalic, Mucus membranes moist. No thrush.  Neck: Supple, No tracheal deviation. No obvious thyromegaly Chest: No pain to chest wall compression. Good respiratory excursion. No audible wheezing CV: Pulses intact. Regular rhythm. No major extremity edema  Abdomen: Flat Hernia: Not present. Diastasis recti: Not present. Soft. Nondistended. Nontender. No hepatomegaly. No splenomegaly  Gen: Inguinal hernia: Not present. Inguinal lymph nodes: without lymphadenopathy.   Rectal: (Deferred)  Ext: No obvious deformity or contracture. Edema: Not present. No cyanosis Skin: No major subcutaneous nodules. Warm and dry Musculoskeletal: Severe joint rigidity not present. No obvious clubbing. No digital petechiae.   Labs, Imaging and Diagnostic Testing:  Located in [place]' section of Epic EMR chart  PRIOR NOTES   Not applicable  SURGERY NOTES:  Not applicable  PATHOLOGY:  Not applicable  Assessment and Plan:  DIAGNOSES:  Diagnoses and all orders for this visit:  Colovesical fistula  Diverticulitis  Supplemental oxygen dependent  Panlobular emphysema (CMS-HCC)  Tobacco use disorder  Chronic anticoagulation  Chronic atrial fibrillation (CMS-HCC)    ASSESSMENT/PLAN  Woman with sigmoid inflammation and abscesses and now colovesical fistula strongly suspicious for diverticulitis. Episodes of  bowel changes and thickening and now with pneumaturia.  Standard of care is segmental resection. Reasonable minimally invasive/robotic approach.  The anatomy & physiology of the digestive tract was discussed.  The pathophysiology of the colon was discussed.  Natural history risks without surgery was discussed.   I feel the risks of no intervention will lead to serious problems that outweigh the operative risks; therefore, I recommended a partial colectomy to remove the pathology.  Minimally invasive (Robotic/Laparoscopic) & open techniques were discussed.   Risks such as bleeding, infection, abscess, leak, reoperation, injury to other organs, need for repair of tissues / organs, possible ostomy, hernia, heart attack, stroke, death, and other risks were discussed.  I noted a good likelihood this will help address the problem.   Goals of post-operative recovery were discussed as well.   Need for adequate nutrition, daily bowel regimen and healthy physical activity, to optimize recovery was noted as well. We will  work to minimize complications.  Educational materials were available as well.  Questions were answered.  The patient expresses understanding & wishes to proceed with surgery.   Adin Hector, MD, FACS, MASCRS Esophageal, Gastrointestinal & Colorectal Surgery Robotic and Minimally Invasive Surgery  [facility], [place]  [place]. [address], [place], [place] [address] [phone] Fax [phone] Main  CONTACT INFORMATION:  Weekday (9AM-5PM): Call [facility] main office at [phone]  Weeknight (5PM-9AM) or Weekend/Holiday: Check [email or web address] (password " [email or web address]") for General Surgery [facility] coverage  (Please, do not use SecureChat as it is not reliable communication to operating surgeons for immediate patient care)    02/10/2021

## 2021-02-10 NOTE — Transfer of Care (Signed)
Immediate Anesthesia Transfer of Care Note  Patient: Tara [name]  Procedure(s) Performed: ROBOTIC RESECTION OF COLON, RECTOSIGMOID WITH BILATERAL TAP BLOCK AND INTRAOPERATIVE ASSESSMENT OF PERFUSION USING FIREFLY (Abdomen) POSSIBLE OSTOMY RIGID PROCTOSCOPY  Patient Location: PACU  Anesthesia Type:General  Level of Consciousness: oriented, drowsy and patient cooperative  Airway & Oxygen Therapy: Patient Spontanous Breathing and Patient connected to face mask oxygen  Post-op Assessment: Report given to RN and Post -op Vital signs reviewed and stable  Post vital signs: Reviewed and stable  Last Vitals:  Vitals Value Taken Time  BP 132/69 02/10/21 1554  Temp    Pulse 84 02/10/21 1556  Resp 18 02/10/21 1556  SpO2 100 % 02/10/21 1556  Vitals shown include unvalidated device data.  Last Pain:  Vitals:   02/10/21 1129  TempSrc:   PainSc: 0-No pain         Complications: No notable events documented.

## 2021-02-10 NOTE — Anesthesia Preprocedure Evaluation (Addendum)
Anesthesia Evaluation  Patient identified by MRN, date of birth, ID band Patient awake    Reviewed: Allergy & Precautions, NPO status , Patient's Chart, lab work & pertinent test results  Airway Mallampati: II  TM Distance: >3 FB Neck ROM: Full    Dental  (+) Edentulous Upper, Edentulous Lower, Dental Advisory Given   Pulmonary sleep apnea , COPD (2L O2),  COPD inhaler and oxygen dependent, Current SmokerPatient did not abstain from smoking.,    Pulmonary exam normal breath sounds clear to auscultation       Cardiovascular hypertension, +CHF  Normal cardiovascular exam+ dysrhythmias (on eliquis, last dose 12/3) Atrial Fibrillation  Rhythm:Regular Rate:Normal  EKG: 07/15/20 Rate 50 bpm  Sinus rhythm  Borderline right axis deviation  Minimal ST elevation, anterior leads Baseline wander leads V6  CV: Stress Test 12/25/2020 1. No reversible ischemia or infarction.   2. Normal left ventricular wall motion.   3. Left ventricular ejection fraction 63%   4. Non invasive risk stratification*: Low   Echo 07/04/2020 1. Left ventricular ejection fraction, by estimation, is 65 to 70%. The  left ventricle has normal function. The left ventricle has no regional  wall motion abnormalities. Left ventricular diastolic parameters are  consistent with Grade I diastolic  dysfunction (impaired relaxation).  2. Right ventricular systolic function is normal. The right ventricular  size is normal. There is normal pulmonary artery systolic pressure. The  estimated right ventricular systolic pressure is 30.0 mmHg.  3. The mitral valve is abnormal. Trivial mitral valve regurgitation.  4. The aortic valve is tricuspid. Aortic valve regurgitation is not  visualized.  5. The inferior vena cava is dilated in size with >50% respiratory  variability, suggesting right atrial pressure of 8 mmHg.    Neuro/Psych negative neurological ROS   negative psych ROS   GI/Hepatic Neg liver ROS, GERD  Medicated and Controlled,  Endo/Other  Hypothyroidism   Renal/GU Renal InsufficiencyRenal disease  negative genitourinary   Musculoskeletal  (+) Arthritis ,   Abdominal   Peds  Hematology negative hematology ROS (+)   Anesthesia Other Findings   Reproductive/Obstetrics                            Anesthesia Physical Anesthesia Plan  ASA: 3  Anesthesia Plan: General   Post-op Pain Management: Ketamine IV, Tylenol PO (pre-op) and Lidocaine infusion   Induction: Intravenous  PONV Risk Score and Plan: 2 and Midazolam, Dexamethasone and Ondansetron  Airway Management Planned: Oral ETT  Additional Equipment:   Intra-op Plan:   Post-operative Plan: Extubation in OR  Informed Consent: I have reviewed the patients History and Physical, chart, labs and discussed the procedure including the risks, benefits and alternatives for the proposed anesthesia with the patient or authorized representative who has indicated his/her understanding and acceptance.     Dental advisory given  Plan Discussed with: CRNA  Anesthesia Plan Comments:        Anesthesia Quick Evaluation

## 2021-02-10 NOTE — Anesthesia Postprocedure Evaluation (Signed)
Anesthesia Post Note  Patient: Tara [name]  Procedure(s) Performed: ROBOTIC RESECTION OF COLON, RECTOSIGMOID WITH BILATERAL TAP BLOCK AND INTRAOPERATIVE ASSESSMENT OF PERFUSION USING FIREFLY (Abdomen) POSSIBLE OSTOMY RIGID PROCTOSCOPY     Patient location during evaluation: PACU Anesthesia Type: General Level of consciousness: awake and alert Pain management: pain level controlled Vital Signs Assessment: post-procedure vital signs reviewed and stable Respiratory status: spontaneous breathing, nonlabored ventilation, respiratory function stable and patient connected to nasal cannula oxygen Cardiovascular status: blood pressure returned to baseline and stable Postop Assessment: no apparent nausea or vomiting Anesthetic complications: no   No notable events documented.  Last Vitals:  Vitals:   02/10/21 1645 02/10/21 1700  BP: 108/62 (!) 105/59  Pulse: 82 80  Resp: 15 14  Temp:  36.4 C  SpO2: 97% 96%    Last Pain:  Vitals:   02/10/21 1645  TempSrc:   PainSc: 0-No pain                 Serafino Burciaga L Chez Bulnes

## 2021-02-10 NOTE — Interval H&P Note (Signed)
History and Physical Interval Note:  02/10/2021 12:45 PM  Tara [name]  has presented today for surgery, with the diagnosis of COLOVESICAL FISTULA.  The various methods of treatment have been discussed with the patient and family. After consideration of risks, benefits and other options for treatment, the patient has consented to  Procedure(s): ROBOTIC RESECTION OF COLON, RECTOSIGMOID, POSSIBLE BLADDER REPAIR (N/A) POSSIBLE OSTOMY (N/A) RIGID PROCTOSCOPY (N/A) as a surgical intervention.  The patient's history has been reviewed, patient examined, no change in status, stable for surgery.  I have reviewed the patient's chart and labs.  Questions were answered to the patient's satisfaction.    I have re-reviewed the the patient's records, history, medications, and allergies.  I have re-examined the patient.  I again discussed intraoperative plans and goals of post-operative recovery.  The patient agrees to proceed.  Tara [name]  1950/07/23 [id number]  Patient Care Team: Neale Burly, MD as PCP - General (Internal Medicine) Michael Boston, MD as Consulting Physician (General Surgery) Rogene Houston, MD as Consulting Physician (Gastroenterology) Chesley Mires, MD as Consulting Physician (Pulmonary Disease) Assar, Jodelle Jacorion Klem, DO as Referring Physician (Cardiology)  Patient Active Problem List   Diagnosis Date Noted   Moderate COPD (chronic obstructive pulmonary disease) ([place]) 12/07/2020   Pre-operative respiratory examination 12/07/2020   Hx of sleep apnea 12/07/2020   Colovesical fistula 11/12/2020   Urinary tract infection 11/12/2020   Diverticulitis of colon 09/21/2020   Cigarette smoker 07/21/2020   Essential (primary) hypertension 07/21/2020   Panlobular emphysema ([place]) 07/21/2020   Chronic respiratory failure with hypoxia ([place]) 07/03/2020   Aspiration pneumonia ([place])    Pleural effusion    CKD (chronic kidney disease), stage III ([place]) 07/01/2020    Past Medical History:   Diagnosis Date   Arthritis    back   CHF (congestive heart failure) ([place])    COPD (chronic obstructive pulmonary disease) ([place])    Hx of sleep apnea 12/07/2020   Hypothyroidism    Pre-diabetes     Past Surgical History:  Procedure Laterality Date   ABDOMINAL HYSTERECTOMY  1985   BSO and appy   BACK SURGERY  2006   rod and screws   Justen Fonda    Social History   Socioeconomic History   Marital status: Widowed    Spouse name: Not on file   Number of children: Not on file   Years of education: Not on file   Highest education level: Not on file  Occupational History   Not on file  Tobacco Use   Smoking status: Some Days    Packs/day: 0.50    Years: 57.00    Pack years: 28.50    Types: Cigarettes   Smokeless tobacco: Never  Vaping Use   Vaping Use: Never used  Substance and Sexual Activity   Alcohol use: Not Currently   Drug use: Never   Sexual activity: Not Currently  Other Topics Concern   Not on file  Social History Narrative   Not on file   Social Determinants of Health   Financial Resource Strain: Not on file  Food Insecurity: Not on file  Transportation Needs: Not on file  Physical Activity: Not on file  Stress: Not on file  Social Connections: Not on file  Intimate Partner Violence: Not on file    History reviewed. No pertinent family history.  Medications Prior to Admission  Medication Sig Dispense Refill Last Dose   aspirin EC 81  MG tablet Take 81 mg by mouth at bedtime. Swallow whole.   02/06/2021   atorvastatin (LIPITOR) 20 MG tablet Take 20 mg by mouth at bedtime.   Past Week   Cholecalciferol (VITAMIN D3) 10 MCG (400 UNIT) CAPS Take 800 Units by mouth daily.   Past Week   diltiazem (CARDIZEM CD) 120 MG 24 hr capsule Take 120 mg by mouth daily.   02/10/2021   ELIQUIS 5 MG TABS tablet Take 2.5 mg by mouth 2 (two) times daily.   02/06/2021 at 2000   EUTHYROX 50 MCG tablet Take 50 mcg by mouth daily before  breakfast.   02/10/2021   furosemide (LASIX) 20 MG tablet Take 1 tablet (20 mg total) by mouth daily. (Patient taking differently: Take 20 mg by mouth at bedtime.) 30 tablet 11 02/09/2021   Krill Oil 1000 MG CAPS Take 1,000 mg by mouth daily.   Past Week   levalbuterol (XOPENEX) 0.63 MG/3ML nebulizer solution Take 0.63 mg by nebulization every 4 (four) hours as needed for wheezing or shortness of breath.   Past Month   LORazepam (ATIVAN) 0.5 MG tablet Take 0.5 mg by mouth 2 (two) times daily.   Past Week   Omega-3 Fatty Acids (FISH OIL) 1000 MG CAPS Take 1,000 mg by mouth in the morning and at bedtime.   Past Week   pantoprazole (PROTONIX) 40 MG tablet Take 40 mg by mouth in the morning.   02/10/2021   potassium chloride SA (KLOR-CON M) 20 MEQ tablet Take 20 mEq by mouth at bedtime.   02/09/2021   Tiotropium Bromide Monohydrate (SPIRIVA RESPIMAT) 2.5 MCG/ACT AERS Inhale 2 puffs into the lungs daily. 4 g 0 02/10/2021   tiZANidine (ZANAFLEX) 4 MG capsule Take 4 mg by mouth at bedtime.      Tiotropium Bromide Monohydrate (SPIRIVA RESPIMAT) 2.5 MCG/ACT AERS Inhale 2 puffs into the lungs daily. (Patient not taking: Reported on 01/26/2021) 4 g 3 Not Taking    Current Facility-Administered Medications  Medication Dose Route Frequency Provider Last Rate Last Admin   alvimopan (ENTEREG) capsule 12 mg  12 mg Oral On Call to OR Michael Boston, MD       bupivacaine liposome (EXPAREL) 1.3 % injection 266 mg  20 mL Infiltration Once Michael Boston, MD       cefoTEtan (CEFOTAN) 2 g in sodium chloride 0.9 % 100 mL IVPB  2 g Intravenous On Call to OR Michael Boston, MD       feeding supplement (ENSURE PRE-SURGERY) liquid 296 mL  296 mL Oral Once Michael Boston, MD       feeding supplement (ENSURE PRE-SURGERY) liquid 592 mL  592 mL Oral Once Michael Boston, MD       lactated ringers infusion   Intravenous Continuous Oleta Mouse, MD 10 mL/hr at 02/10/21 1139 New Bag at 02/10/21 1139     Allergies  Allergen  Reactions   Codeine Nausea And Vomiting    Patient reports that it makes her nauseated.     BP (!) 141/88   Pulse 74   Temp 99.2 F (37.3 C) (Oral)   Resp 16   Ht 5\' 1"  (1.549 m)   Wt 53.5 kg   SpO2 98%   BMI 22.30 kg/m   Labs: Results for orders placed or performed during the hospital encounter of 02/10/21 (from the past 48 hour(s))  Glucose, capillary     Status: None   Collection Time: 02/10/21 11:20 AM  Result Value Ref Range  Glucose-Capillary 84 70 - 99 mg/dL    Comment: Glucose reference range applies only to samples taken after fasting for at least 8 hours.    Imaging / Studies: No results found.   Adin Hector, M.D., F.A.C.S. Gastrointestinal and Minimally Invasive Surgery [facility] [address], [place], [place] [address] [phone] Main / Paging  02/10/2021 12:45 PM     Adin Hector

## 2021-02-10 NOTE — Discharge Instructions (Addendum)
SURGERY: POST OP INSTRUCTIONS (Surgery for small bowel obstruction, colon resection, etc)   ######################################################################  EAT Gradually transition to a high fiber diet with a fiber supplement over the next few days after discharge  WALK Walk an hour a day.  Control your pain to do that.    CONTROL PAIN Control pain so that you can walk, sleep, tolerate sneezing/coughing, go up/down stairs.  HAVE A BOWEL MOVEMENT DAILY Keep your bowels regular to avoid problems.  OK to try a laxative to override constipation.  OK to use an antidairrheal to slow down diarrhea.  Call if not better after 2 tries  CALL IF YOU HAVE PROBLEMS/CONCERNS Call if you are still struggling despite following these instructions. Call if you have concerns not answered by these instructions  ######################################################################   DIET Follow a light diet the first few days at home.  Start with a bland diet such as soups, liquids, starchy foods, low fat foods, etc.  If you feel full, bloated, or constipated, stay on a ful liquid or pureed/blenderized diet for a few days until you feel better and no longer constipated. Be sure to drink plenty of fluids every day to avoid getting dehydrated (feeling dizzy, not urinating, etc.). Gradually add a fiber supplement to your diet over the next week.  Gradually get back to a regular solid diet.  Avoid fast food or heavy meals the first week as you are more likely to get nauseated. It is expected for your digestive tract to need a few months to get back to normal.  It is common for your bowel movements and stools to be irregular.  You will have occasional bloating and cramping that should eventually fade away.  Until you are eating solid food normally, off all pain medications, and back to regular activities; your bowels will not be normal. Focus on eating a low-fat, high fiber diet the rest of your life  (See Getting to [place], below).  CARE of your INCISION or WOUND It is good for closed incision and even open wounds to be washed every day.  Shower every day.  Short baths are fine.  Wash the incisions and wounds clean with soap & water.     If you have a closed incision(s), wash the incision with soap & water every day.  You may leave closed incisions open to air if it is dry.   You may cover the incision with clean gauze & replace it after your daily shower for comfort.  It is good for closed incisions and even open wounds to be washed every day.  Shower every day.  Short baths are fine.  Wash the incisions and wounds clean with soap & water.    You may leave closed incisions open to air if it is dry.   You may cover the incision with clean gauze & replace it after your daily shower for comfort.  TEGADERM:  You have clear gauze band-aid dressings over your closed incision(s).  Remove the dressings 3 days after surgery.   If you have an open wound with a wound vac, see wound vac care instructions.     ACTIVITIES as tolerated Start light daily activities --- self-care, walking, climbing stairs-- beginning the day after surgery.  Gradually increase activities as tolerated.  Control your pain to be active.  Stop when you are tired.  Ideally, walk several times a day, eventually an hour a day.   Most people are back to most day-to-day activities in  a few weeks.  It takes 4-8 weeks to get back to unrestricted, intense activity. If you can walk 30 minutes without difficulty, it is safe to try more intense activity such as jogging, treadmill, bicycling, low-impact aerobics, swimming, etc. Save the most intensive and strenuous activity for last (Usually 4-8 weeks after surgery) such as sit-ups, heavy lifting, contact sports, etc.  Refrain from any intense heavy lifting or straining until you are off narcotics for pain control.  You will have off days, but things should improve  week-by-week. DO NOT PUSH THROUGH PAIN.  Let pain be your guide: If it hurts to do something, don't do it.  Pain is your body warning you to avoid that activity for another week until the pain goes down. You may drive when you are no longer taking narcotic prescription pain medication, you can comfortably wear a seatbelt, and you can safely make sudden turns/stops to protect yourself without hesitating due to pain. You may have sexual intercourse when it is comfortable. If it hurts to do something, stop.  MEDICATIONS Take your usually prescribed home medications unless otherwise directed.   Blood thinners:  Usually you can restart any strong blood thinners after the second postoperative day.  It is OK to take aspirin right away.     If you are on strong blood thinners (warfarin/Coumadin, Plavix, Xerelto, Eliquis, Pradaxa, etc), discuss with your surgeon, medicine PCP, and/or cardiologist for instructions on when to restart the blood thinner & if blood monitoring is needed (PT/INR blood check, etc).     PAIN CONTROL Pain after surgery or related to activity is often due to strain/injury to muscle, tendon, nerves and/or incisions.  This pain is usually short-term and will improve in a few months.  To help speed the process of healing and to get back to regular activity more quickly, DO THE FOLLOWING THINGS TOGETHER: Increase activity gradually.  DO NOT PUSH THROUGH PAIN Use Ice and/or Heat Try Gentle Massage and/or Stretching Take over the counter pain medication Take Narcotic prescription pain medication for more severe pain  Good pain control = faster recovery.  It is better to take more medicine to be more active than to stay in bed all day to avoid medications.  Increase activity gradually Avoid heavy lifting at first, then increase to lifting as tolerated over the next 6 weeks. Do not "push through" the pain.  Listen to your body and avoid positions and maneuvers than reproduce the pain.   Wait a few days before trying something more intense Walking an hour a day is encouraged to help your body recover faster and more safely.  Start slowly and stop when getting sore.  If you can walk 30 minutes without stopping or pain, you can try more intense activity (running, jogging, aerobics, cycling, swimming, treadmill, sex, sports, weightlifting, etc.) Remember: If it hurts to do it, then don't do it! Use Ice and/or Heat You will have swelling and bruising around the incisions.  This will take several weeks to resolve. Ice packs or heating pads (6-8 times a day, 30-60 minutes at a time) will help sooth soreness & bruising. Some people prefer to use ice alone, heat alone, or alternate between ice & heat.  Experiment and see what works best for you.  Consider trying ice for the first few days to help decrease swelling and bruising; then, switch to heat to help relax sore spots and speed recovery. Shower every day.  Short baths are fine.  It feels good!  Keep the incisions and wounds clean with soap & water.   Try Gentle Massage and/or Stretching Massage at the area of pain many times a day Stop if you feel pain - do not overdo it Take over the counter pain medication This helps the muscle and nerve tissues become less irritable and calm down faster Choose ONE of the following over-the-counter anti-inflammatory medications: Acetaminophen 578m tabs (Tylenol) 1-2 pills with every meal and just before bedtime (avoid if you have liver problems or if you have acetaminophen in you narcotic prescription) Naproxen 227mtabs (ex. Aleve, Naprosyn) 1-2 pills twice a day (avoid if you have kidney, stomach, IBD, or bleeding problems) Ibuprofen 20073mabs (ex. Advil, Motrin) 3-4 pills with every meal and just before bedtime (avoid if you have kidney, stomach, IBD, or bleeding problems) Take with food/snack several times a day as directed for at least 2 weeks to help keep pain / soreness down & more  manageable. Take Narcotic prescription pain medication for more severe pain A prescription for strong pain control is often given to you upon discharge (for example: oxycodone/Percocet, hydrocodone/Norco/Vicodin, or tramadol/Ultram) Take your pain medication as prescribed. Be mindful that most narcotic prescriptions contain Tylenol (acetaminophen) as well - avoid taking too much Tylenol. If you are having problems/concerns with the prescription medicine (does not control pain, nausea, vomiting, rash, itching, etc.), please call us [phone] see if we need to switch you to a different pain medicine that will work better for you and/or control your side effects better. If you need a refill on your pain medication, you must call the office before 4 pm and on weekdays only.  By federal law, prescriptions for narcotics cannot be called into a pharmacy.  They must be filled out on paper & picked up from our office by the patient or authorized caretaker.  Prescriptions cannot be filled after 4 pm nor on weekends.    WHEN TO CALL US [phone] uncontrolled or worsening pain  Fever over 101 F (38.5 C) Concerns with the incision: Worsening pain, redness, rash/hives, swelling, bleeding, or drainage Reactions / problems with new medications (itching, rash, hives, nausea, etc.) Nausea and/or vomiting Difficulty urinating Difficulty breathing Worsening fatigue, dizziness, lightheadedness, blurred vision Other concerns If you are not getting better after two weeks or are noticing you are getting worse, contact our office [phone] for further advice.  We may need to adjust your medications, re-evaluate you in the office, send you to the emergency room, or see what other things we can do to help. The clinic staff is available to answer your questions during regular business hours (8:30am-5pm).  Please don't hesitate to call and ask to speak to one of our nurses for clinical concerns.    A  surgeon from [facility] is always on call at the hospitals 24 hours/day If you have a medical emergency, go to the nearest emergency room or call 911.  FOLLOW UP in our office One the day of your discharge from the hospital (or the next business weekday), please call [facility] to set up or confirm an appointment to see your surgeon in the office for a follow-up appointment.  Usually it is 2-3 weeks after your surgery.   If you have skin staples at your incision(s), let the office know so we can set up a time in the office for the nurse to remove them (usually around 10 days after surgery). Make sure that you call for  appointments the day of discharge (or the next business weekday) from the hospital to ensure a convenient appointment time. IF YOU HAVE DISABILITY OR FAMILY LEAVE FORMS, BRING THEM TO THE OFFICE FOR PROCESSING.  DO NOT GIVE THEM TO YOUR DOCTOR.  [facility] [address] [place], [place], [place]  [address] ? [phone] - Main [phone] - [place],  [phone] - Fax [email or web address]  GETTING TO GOOD BOWEL HEALTH. It is expected for your digestive tract to need a few months to get back to normal.  It is common for your bowel movements and stools to be irregular.  You will have occasional bloating and cramping that should eventually fade away.  Until you are eating solid food normally, off all pain medications, and back to regular activities; your bowels will not be normal.   Avoiding constipation The goal: ONE SOFT BOWEL MOVEMENT A DAY!    Drink plenty of fluids.  Choose water first. TAKE A FIBER SUPPLEMENT EVERY DAY THE REST OF YOUR LIFE During your first week back home, gradually add back a fiber supplement every day Experiment which form you can tolerate.   There are many forms such as powders, tablets, wafers, gummies, etc Psyllium bran (Metamucil), methylcellulose (Citrucel), Miralax or Glycolax,  Benefiber, Flax Seed.  Adjust the dose week-by-week (1/2 dose/day to 6 doses a day) until you are moving your bowels 1-2 times a day.  Cut back the dose or try a different fiber product if it is giving you problems such as diarrhea or bloating. Sometimes a laxative is needed to help jump-start bowels if constipated until the fiber supplement can help regulate your bowels.  If you are tolerating eating & you are farting, it is okay to try a gentle laxative such as double dose MiraLax, prune juice, or Milk of Magnesia.  Avoid using laxatives too often. Stool softeners can sometimes help counteract the constipating effects of narcotic pain medicines.  It can also cause diarrhea, so avoid using for too long. If you are still constipated despite taking fiber daily, eating solids, and a few doses of laxatives, call our office. Controlling diarrhea Try drinking liquids and eating bland foods for a few days to avoid stressing your intestines further. Avoid dairy products (especially milk & ice cream) for a short time.  The intestines often can lose the ability to digest lactose when stressed. Avoid foods that cause gassiness or bloating.  Typical foods include beans and other legumes, cabbage, broccoli, and dairy foods.  Avoid greasy, spicy, fast foods.  Every person has some sensitivity to other foods, so listen to your body and avoid those foods that trigger problems for you. Probiotics (such as active yogurt, Align, etc) may help repopulate the intestines and colon with normal bacteria and calm down a sensitive digestive tract Adding a fiber supplement gradually can help thicken stools by absorbing excess fluid and retrain the intestines to act more normally.  Slowly increase the dose over a few weeks.  Too much fiber too soon can backfire and cause cramping & bloating. It is okay to try and slow down diarrhea with a few doses of antidiarrheal medicines.   Bismuth subsalicylate (ex. Kayopectate, Pepto Bismol)  for a few doses can help control diarrhea.  Avoid if pregnant.   Loperamide (Imodium) can slow down diarrhea.  Start with one tablet (47m) first.  Avoid if you are having fevers or severe pain.  ILEOSTOMY PATIENTS WILL HAVE CHRONIC DIARRHEA since their colon is  not in use.    Drink plenty of liquids.  You will need to drink even more glasses of water/liquid a day to avoid getting dehydrated. Record output from your ileostomy.  Expect to empty the bag every 3-4 hours at first.  Most people with a permanent ileostomy empty their bag 4-6 times at the least.   Use antidiarrheal medicine (especially Imodium) several times a day to avoid getting dehydrated.  Start with a dose at bedtime & breakfast.  Adjust up or down as needed.  Increase antidiarrheal medications as directed to avoid emptying the bag more than 8 times a day (every 3 hours). Work with your wound ostomy nurse to learn care for your ostomy.  See ostomy care instructions. TROUBLESHOOTING IRREGULAR BOWELS 1) Start with a soft & bland diet. No spicy, greasy, or fried foods.  2) Avoid gluten/wheat or dairy products from diet to see if symptoms improve. 3) Miralax 17gm or flax seed mixed in [place]. water or juice-daily. May use 2-4 times a day as needed. 4) Gas-X, Phazyme, etc. as needed for gas & bloating.  5) Prilosec (omeprazole) over-the-counter as needed 6)  Consider probiotics (Align, Activa, etc) to help calm the bowels down  Call your doctor if you are getting worse or not getting better.  Sometimes further testing (cultures, endoscopy, X-ray studies, CT scans, bloodwork, etc.) may be needed to help diagnose and treat the cause of the diarrhea. [facility], [place], [place], [place], [place]  [phone] - Main.    [phone]  - Toll Free.   [phone] - Fax [email or web address]   ##############################   STOP SMOKING!  We strongly recommend that you stop  smoking.  Smoking increases the risk of surgery including infection in the form of an open wound, pus formation, abscess, hernia at an incision on the abdomen, etc.  You have an increased risk of other MAJOR complications such as stroke, heart attack, forming clots in the leg and/or lungs, and death.    Smoking Cessation Quitting smoking is important to your health and has many advantages. However, it is not always easy to quit since nicotine is a very addictive drug. Often times, people try 3 times or more before being able to quit. This document explains the best ways for you to prepare to quit smoking. Quitting takes hard work and a lot of effort, but you can do it. ADVANTAGES OF QUITTING SMOKING You will live longer, feel better, and live better. Your body will feel the impact of quitting smoking almost immediately. Within 20 minutes, blood pressure decreases. Your pulse returns to its normal level. After 8 hours, carbon monoxide levels in the blood return to normal. Your oxygen level increases. After 24 hours, the chance of having a heart attack starts to decrease. Your breath, hair, and body stop smelling like smoke. After 48 hours, damaged nerve endings begin to recover. Your sense of taste and smell improve. After 72 hours, the body is virtually free of nicotine. Your bronchial tubes relax and breathing becomes easier. After 2 to 12 weeks, lungs can hold more air. Exercise becomes easier and circulation improves. The risk of having a heart attack, stroke, cancer, or lung disease is greatly reduced. After 1 year, the risk of coronary heart disease is cut in half. After 5 years, the risk of stroke falls to the same as a nonsmoker. After 10 years, the risk of lung cancer is cut in half and the risk of other  cancers decreases significantly. After 15 years, the risk of coronary heart disease drops, usually to the level of a nonsmoker. If you are pregnant, quitting smoking will improve your  chances of having a healthy baby. The people you live with, especially any children, will be healthier. You will have extra money to spend on things other than cigarettes. QUESTIONS TO THINK ABOUT BEFORE ATTEMPTING TO QUIT You may want to talk about your answers with your caregiver. Why do you want to quit? If you tried to quit in the past, what helped and what did not? What will be the most difficult situations for you after you quit? How will you plan to handle them? Who can help you through the tough times? Your family? Friends? A caregiver? What pleasures do you get from smoking? What ways can you still get pleasure if you quit? Here are some questions to ask your caregiver: How can you help me to be successful at quitting? What medicine do you think would be best for me and how should I take it? What should I do if I need more help? What is smoking withdrawal like? How can I get information on withdrawal? GET READY Set a quit date. Change your environment by getting rid of all cigarettes, ashtrays, matches, and lighters in your home, car, or work. Do not let people smoke in your home. Review your past attempts to quit. Think about what worked and what did not. GET SUPPORT AND ENCOURAGEMENT You have a better chance of being successful if you have help. You can get support in many ways. Tell your family, friends, and co-workers that you are going to quit and need their support. Ask them not to smoke around you. Get individual, group, or telephone counseling and support. Programs are available at [employer] and health centers. Call your local health department for information about programs in your area. Spiritual beliefs and practices may help some smokers quit. Download a "quit meter" on your computer to keep track of quit statistics, such as how long you have gone without smoking, cigarettes not smoked, and money saved. Get a self-help book about quitting smoking and staying off of  tobacco. [place] yourself from urges to smoke. Talk to someone, go for a walk, or occupy your time with a task. Change your normal routine. Take a different route to work. Drink tea instead of coffee. Eat breakfast in a different place. Reduce your stress. Take a hot bath, exercise, or read a book. Plan something enjoyable to do every day. Reward yourself for not smoking. Explore interactive web-based programs that specialize in helping you quit. GET MEDICINE AND USE IT CORRECTLY Medicines can help you stop smoking and decrease the urge to smoke. Combining medicine with the above behavioral methods and support can greatly increase your chances of successfully quitting smoking. Nicotine replacement therapy helps deliver nicotine to your body without the negative effects and risks of smoking. Nicotine replacement therapy includes nicotine gum, lozenges, inhalers, nasal sprays, and skin patches. Some may be available over-the-counter and others require a prescription. Antidepressant medicine helps people abstain from smoking, but how this works is unknown. This medicine is available by prescription. Nicotinic receptor partial agonist medicine simulates the effect of nicotine in your brain. This medicine is available by prescription. Ask your caregiver for advice about which medicines to use and how to use them based on your health history. Your caregiver will tell you what side effects to look out for  if you choose to be on a medicine or therapy. Carefully read the information on the package. Do not use any other product containing nicotine while using a nicotine replacement product.  RELAPSE OR DIFFICULT SITUATIONS Most relapses occur within the first 3 months after quitting. Do not be discouraged if you start smoking again. Remember, most people try several times before finally quitting. You may have symptoms of withdrawal because your body is used to nicotine. You may  crave cigarettes, be irritable, feel very hungry, cough often, get headaches, or have difficulty concentrating. The withdrawal symptoms are only temporary. They are strongest when you first quit, but they will go away within 10 14 days. To reduce the chances of relapse, try to: Avoid drinking alcohol. Drinking lowers your chances of successfully quitting. Reduce the amount of caffeine you consume. Once you quit smoking, the amount of caffeine in your body increases and can give you symptoms, such as a rapid heartbeat, sweating, and anxiety. Avoid smokers because they can make you want to smoke. Do not let weight gain distract you. Many smokers will gain weight when they quit, usually less than 10 pounds. Eat a healthy diet and stay active. You can always lose the weight gained after you quit. Find ways to improve your mood other than smoking. FOR MORE INFORMATION  www.smokefree.gov    While it can be one of the most difficult things to do, the [place] community has programs to help you stop.  Consider talking with your primary care physician about options.  Also, Smoking Cessation classes are available through the [facility]:  The smoking cessation program is a proven-effective program from the American Lung Association. The program is available for anyone 30 and older who currently smokes. The program lasts for 7 weeks and is 8 sessions. Each class will be approximately 1 1/2 hours. The program is every Tuesday.  All classes are 12-1:30pm and same location.  Event Location Information:  Location: [place] 2nd Floor Conference Room 2-037; located next to [facility] cross streets: [place] Entrance into the [facility] is adjacent to the [employer] main entrance. The conference room is located on the 2nd floor.  Parking Instructions: Visitor parking is adjacent to [employer] main entrance  and the [place]   A smoking cessation program is also offered through the [facility]. Register online at ClickDebate.gl or call [phone] for more information.  Tobacco cessation counseling is available at [facility]. Call [phone] for a free appointment.  Tobacco cessation classes also are available through the [place] in [place]. For information, call [phone].  The Patient Education Network features videos on tobacco cessation. Please consult your listings in the center of this book to find instructions on how to access this resource.  If you want more information, ask your nurse.     #################################

## 2021-02-10 NOTE — Op Note (Signed)
02/10/2021  3:40 PM  PATIENT:  Tara [name]  70 y.o. female  Patient Care Team: Neale Burly, MD as PCP - General (Internal Medicine) Michael Boston, MD as Consulting Physician (General Surgery) Rogene Houston, MD as Consulting Physician (Gastroenterology) Chesley Mires, MD as Consulting Physician (Pulmonary Disease) Assar, Jodelle Senan Urey, DO as Referring Physician (Cardiology)  PRE-OPERATIVE DIAGNOSIS:  COLOVESICAL FISTULA  POST-OPERATIVE DIAGNOSIS:   COLOVESICAL FISTULA PARTIAL COLON OBSTRUCTION  PROCEDURE:   ROBOTIC LOW ANTERIOR RECTOSIGMOID RESECTION  TAKEDOWN OF COLOVESICAL FISTULA DRAINAGE OF INTRAABDOMINAL ABSCESS TRANSVERSUS ABDOMINIS PLANE (TAP) BLOCK - BILATERAL INTRAOPERATIVE ASSESSMENT OF PERFUSION USING FIREFLY IMMUNOFLUORESCENCE RIGID PROCTOSCOPY  SURGEON:  Adin Hector, MD  ASSISTANT: Leighton Ruff, MD, FACS, FASCRS An experienced assistant was required given the standard of surgical care given the complexity of the case.  This assistant was needed for exposure, dissection, suction, tissue approximation, retraction, perception, etc.  ANESTHESIA:     General  Regional TRANSVERSUS ABDOMINIS PLANE (TAP) nerve block for perioperative & postoperative pain control provided with liposomal bupivacaine (Experel) mixed with 0.25% bupivacaine as a Bilateral TAP block x 57mL each side at the level of the transverse abdominis & preperitoneal spaces along the flank at the anterior axillary line, from subcostal ridge to iliac crest under laparoscopic guidance   Local field block at port sites & extraction wound  EBL:  Total I/O In: 1000 [I.V.:1000] Out: 140 [Urine:100; Blood:40]  Delay start of Pharmacological VTE agent (>24hrs) due to surgical blood loss or risk of bleeding:  no  DRAINS: 19 Fr Blake drain goes to the pelvis  SPECIMEN:   RECTOSIGMOID COLON (open end proximal) DISTAL ANASTOMOTIC RING (final distal margin)  DISPOSITION OF SPECIMEN:   PATHOLOGY  COUNTS:  YES  PLAN OF CARE: Admit to inpatient   PATIENT DISPOSITION:  PACU - hemodynamically stable.  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  Even though felt to be released in the intermediate to high risk with her COPD, I felt the patient would not resolve with his problem without surgical intervention.  I recommended segmental resection:  The anatomy & physiology of the digestive tract was discussed.  The pathophysiology was discussed.  Natural history risks without surgery was discussed.   I worked to give an overview of the disease and the frequent need to have multispecialty involvement.  I feel the risks of no intervention will lead to serious problems that outweigh the operative risks; therefore, I recommended a partial colectomy to remove the pathology.  Laparoscopic & open techniques were discussed.   Risks such as bleeding, infection, abscess, leak, reoperation, possible ostomy, hernia, heart attack, death, and other risks were discussed.  I noted a good likelihood this will help address the problem.   Goals of post-operative recovery were discussed as well.  We will work to minimize complications.  Educational materials on the pathology had been given in the office.  Questions were answered.    The patient expressed understanding & wished to proceed with surgery.  OR FINDINGS:   Patient had very inflamed and torturous rectosigmoid colon with very dense adhesions to the left lateral pelvis and left dome of the bladder close to left ureteropelvic junction.  Small contained abscess in the region drained.  No obvious metastatic disease on visceral parietal peritoneum or liver.  The 31 EEA anastomosis rests 10 cm from the  anal verge by rigid proctoscopy.  CASE DATA:  Type of patient?:  Elective [facility] Private Case  Status of Case? Elective Scheduled  Infection Present At Time Of Surgery (PATOS)?  ABSCESS  DESCRIPTION:   Informed consent was confirmed.  The patient underwent general anaesthesia without difficulty.  The patient was positioned appropriately.  VTE prevention in place.  The patient was clipped, prepped, & draped in a sterile fashion.  Surgical timeout confirmed our plan.  The patient was positioned in reverse Trendelenburg.  Abdominal entry was gained using Varess technique at the left subcostal ridge on the anterior abdominal wall.  No elevated EtCO2 noted.  Port placed.  Camera inspection revealed no injury.  Extra ports were carefully placed under direct laparoscopic visualization.  Upon entering the abdomen (organ space), I encountered a phlegmon involving the rectosigmoid colon.  Later an abscess was drained off the bladder next to the rectosigmoid colon was chronically dilated very suspicious for at least partial colonic obstruction at the very inflamed, twisted, and trictured rectosigmoid colon.   I reflected the greater omentum and the upper abdomen the small bowel in the upper abdomen.  The patient was carefully positioned.  The [employer] daVinci robot was docked with camera & instruments carefully placed.  The patient had very tortuous inflamed rectosigmoid colon.  Initially mobilized it in a lateral to medial fashion somewhat as well as a few wispy adhesions in the anterior abdominal wall and pelvis.  The main adhesions between the rectosigmoid and bladder are intact for now.  I mobilized the rectosigmoid colon & elevated it to put the main pedicle on tension.  I scored the base of peritoneum of the medial side of the mesentery of the elevated left colon from the ligament of Treitz to the mid rectum.   I elevated the sigmoid mesentery and entered into the retro-mesenteric plane. We were able to identify the left ureter and gonadal vessels. We kept those posterior  within the retroperitoneum and elevated the left colon mesentery off that. I did isolate the inferior mesenteric artery (IMA) pedicle but did not ligate it yet.  I continued distally and got into the avascular plane posterior to the mesorectum, sparing the nervi ergentes.. This allowed me to help mobilize the rectum as well by freeing the mesorectum off the sacrum.  I stayed away from the right and left ureters.  I kept the lateral vascular pedicles to the rectum intact.  I skeletonized the lymph nodes off the inferior mesenteric artery pedicle.  I went down to its takeoff from the aorta.   I isolated the inferior mesenteric vein off of the ligament of Treitz just cephalad to that as well.  After confirming the left ureter was out of the way, I went ahead and ligated the inferior mesenteric artery pedicle just near its takeoff from the aorta.  I did ligate the inferior mesenteric vein in a similar fashion.  We ensured hemostasis.  I continued medial to lateral dissection to free the left colon mesentery off the retroperitoneum going up towards the splenic flexure to allow good mobility and protect the colon mesentery.  I mobilized the left colon in a lateral to medial fashion off the retroperitoneum and sidewall attachments along the line of Toldt up towards the splenic flexure to ensure good mobilization of the remaining left colon to reach into the pelvis.   I then focused on the colovesical fistula.  Initially did some vessel sealer but then had to switch to cautery scissors to free  the rectosigmoid colon off the left lateral pelvis.  We stayed away from the ureter is much as possible but it and the gonadal vessels were currently involved in the most inflammatory region.  As we came around came through an abscess resting to the rectosigmoid colon in the left lateral dome of the bladder.  Eventually came from the right side along the left posterior mid pelvis and came back more proximally.  Between dissection  posteriorly left lateral and right medial, could carefully free off the rectosigmoid colon off the bladder.  We had the OR team insufflate the bladder with methylene blue dyed isotonic fluid over 350 mL.  This distended the bladder quite well.  There was no leaking of fluid, arguing against an active colovesical fistula at this time.  No leaking through the ureters either.  Bladder desufflated.  We then focused on mesorectal dissection.  Freed the mesorectum off the presacral plane until I was distal to the concerning region.  Freed off peritoneum on the lateral sidewalls as well and transected the mesentery of the lateral pedicles to get distal to the area of concern.  Came around anteriorly such that I had good circumferential mesorectal excision and a good margin distal to the area of concern.  I chose a region at the distal descending colon that was soft and easily reached down to the rectal stump.  I transected the mesentery of the colon radially to preserve remaining colon blood supply.  I skeletonized the mesorectum and transected at the proximal rectum using a robotic stapler.  We then chose a region for the proximal margin that would reach well for our planned anastomosis.  Transected the colon mesentery radially to preserve good collateral and marginal artery blood supply.  To access vascular perfusion of tissues, we asked anesthesia use intravenous  indocyanine green (ICG) with IV flush.  I switched to the NIR fluorescence (Firefly mode) imaging window on the daVinci robot platform.  We were able to see good light green visualization of blood vessels with good vascular perfusion of tissues, confirming good tissue perfusion of tissues planned for anastomosis (distal descending colon and mid rectum)  We created an extraction incision through a small Pfannenstiel incision in the suprapubic region.  Placed a wound protector.  I was able to eviscerate the rectosigmoid and descending colon out the  wound.   I clamped the colon proximal to this area using a reusable pursestringer device.  Passed a 2-0 Keith needle. I transected at the descending/sigmoid junction with a scalpel. I got healthy bleeding mucosa.  We sent the rectosigmoid colon specimen off to go to pathology.  We sized the colon orifice.  I chose a 33mm EEA anvil stapler system.  I reinforced the prolene pursestring with interrupted silk "belt loop" sutures.  I placed the anvil to the open end of the proximal remaining colon and closed around it using the pursestring.    We did copious irrigation with crystalloid solution.  Hemostasis was good.  The distal end of the remaining colon easily reached down to the rectal stump, therefore, splenic flexure mobilization was not needed.      Dr Marcello Moores scrubbed down and did gentle anal dilation.  The most proximal end of the rectal stump was somewhat fibrotic and scarred down.  Eventually thinned out a few adhesions and use dilators to help stretch out the proximal end of the rectal stump without thinning out the staple line or rectum.  With this we could bring the stapler  up more easily the spike was brought out at the provimal end of the rectal stump under direct visualization.  I  attached the anvil of the proximal colon the spike of the stapler. Anvil was tightened down and held clamped for 60 seconds.  Orientation was confirmed such that there is no twisting of the colon nor small bowel underneath the mesenteric defect. No concerning tension.  The EEA stapler was fired and held clamped for 30 seconds. The stapler was released & removed. Blue stitch is in the proximal ring.  Care was taken to ensure no other structures were incorporated within this either.  We noted an excellent proximal anastomotic ring and a fair distal rectal ring.   The colon proximal to the anastomosis was then gently occluded. The pelvis was filled with sterile irrigation.  Dr Marcello Moores  did rigid proctoscopy noted the anastomosis  was at 10 cm from the anal verge consistent with the proximal rectum.  There was a negative air leak test. There was no tension of mesentery or bowel at the anastomosis.   Tissues looked viable.  Ureters & bowel uninjured.  The anastomosis looked healthy.  Given her thinned out tissues and somewhat oozy region, decided to place a 19 [place] Blake drain down the pelvis and secured to her right lower quadrant port site.  She did have much greater omentum but her transverse colon did reach down well enough to have the omentum help protect the anastomosis.  Endoluminal gas was evacuated.  Ports & wound protector removed.  We changed gloves & redraped the patient per colon SSI prevention protocol.  We aspirated the antibiotic irrigation.  Hemostasis was good.  Sterile unused instruments were used from this point.  I closed the skin at the port sites using Monocryl stitch and sterile dressing.  I closed the extraction wound using a 0 Vicryl vertical peritoneal closure and a #1 PDS transverse anterior rectal fascial closure like a small Pfannenstiel closure. I closed the skin with some interrupted Monocryl stitches.  I placed sterile dressings.     Patient is being extubated go to recovery room.  No major pulmonary or hemodynamic events.  However with her COPD plan is to watch her at least in the stepdown unit overnight to be safe.  Low threshold to consult medicine and/or pulmonary if needed. I had discussed postop care with the patient in detail the office & in the holding area. Instructions are written. I discussed operative findings, updated the patient's status, discussed probable steps to recovery, and gave postoperative recommendations to the patient's daughter, Carloyn Jaeger .  Recommendations were made.  Questions were answered.  She expressed understanding & appreciation.   Adin Hector, MD, FACS, MASCRS Esophageal, Gastrointestinal & Colorectal Surgery Robotic and Minimally Invasive Surgery  [facility], [place]  [place]. [address] [place], [place] [address] [phone] Fax [phone] Main

## 2021-02-10 NOTE — Anesthesia Procedure Notes (Signed)
Procedure Name: Intubation Date/Time: 02/10/2021 1:07 PM Performed by: Garrel Ridgel, CRNA Pre-anesthesia Checklist: Patient identified, Emergency Drugs available, Suction available and Patient being monitored Patient Re-evaluated:Patient Re-evaluated prior to induction Oxygen Delivery Method: Circle system utilized Preoxygenation: Pre-oxygenation with 100% oxygen Induction Type: IV induction Ventilation: Mask ventilation without difficulty Laryngoscope Size: Mac and 3 Grade View: Grade I Tube type: Oral Tube size: 7.0 mm Number of attempts: 1 Airway Equipment and Method: Stylet and Oral airway Placement Confirmation: ETT inserted through vocal cords under direct vision, positive ETCO2 and breath sounds checked- equal and bilateral Secured at: 22 cm Tube secured with: Tape Dental Injury: Teeth and Oropharynx as per pre-operative assessment

## 2021-02-11 ENCOUNTER — Encounter (HOSPITAL_COMMUNITY): Payer: Self-pay | Admitting: Surgery

## 2021-02-11 DIAGNOSIS — D62 Acute posthemorrhagic anemia: Secondary | ICD-10-CM | POA: Diagnosis not present

## 2021-02-11 LAB — BASIC METABOLIC PANEL
Anion gap: 8 (ref 5–15)
BUN: 21 mg/dL (ref 8–23)
CO2: 29 mmol/L (ref 22–32)
Calcium: 8.7 mg/dL — ABNORMAL LOW (ref 8.9–10.3)
Chloride: 99 mmol/L (ref 98–111)
Creatinine, Ser: 1.67 mg/dL — ABNORMAL HIGH (ref 0.44–1.00)
GFR, Estimated: 33 mL/min — ABNORMAL LOW (ref >60)
Glucose, Bld: 136 mg/dL — ABNORMAL HIGH (ref 70–99)
Potassium: 4.1 mmol/L (ref 3.5–5.1)
Sodium: 136 mmol/L (ref 135–145)

## 2021-02-11 LAB — CBC
HCT: 21.4 % — ABNORMAL LOW (ref 36.0–46.0)
Hemoglobin: 6.9 g/dL — CL (ref 12.0–15.0)
MCH: 32.9 pg (ref 26.0–34.0)
MCHC: 32.2 g/dL (ref 30.0–36.0)
MCV: 101.9 fL — ABNORMAL HIGH (ref 80.0–100.0)
Platelets: 137 10*3/uL — ABNORMAL LOW (ref 150–400)
RBC: 2.1 MIL/uL — ABNORMAL LOW (ref 3.87–5.11)
RDW: 14.1 % (ref 11.5–15.5)
WBC: 7.9 10*3/uL (ref 4.0–10.5)
nRBC: 0 % (ref 0.0–0.2)

## 2021-02-11 LAB — HEMOGLOBIN AND HEMATOCRIT, BLOOD
HCT: 25.3 % — ABNORMAL LOW (ref 36.0–46.0)
Hemoglobin: 8.4 g/dL — ABNORMAL LOW (ref 12.0–15.0)

## 2021-02-11 LAB — PREPARE RBC (CROSSMATCH)

## 2021-02-11 LAB — MAGNESIUM: Magnesium: 1.4 mg/dL — ABNORMAL LOW (ref 1.7–2.4)

## 2021-02-11 MED ORDER — SODIUM CHLORIDE 0.9% FLUSH: 3.0000 mL | INTRAVENOUS | Status: DC | PRN

## 2021-02-11 MED ORDER — ENOXAPARIN SODIUM 30 MG/0.3ML IJ SOSY: 30.0000 mg | PREFILLED_SYRINGE | INTRAMUSCULAR | Status: DC

## 2021-02-11 MED ORDER — SODIUM CHLORIDE 0.9 % IV SOLN: 250.0000 mL | INTRAVENOUS | Status: DC | PRN

## 2021-02-11 MED ORDER — SODIUM CHLORIDE 0.9% FLUSH
3.0000 mL | Freq: Two times a day (BID) | INTRAVENOUS | Status: DC
  Administered 2021-02-11 – 2021-02-15 (×9): 3 mL via INTRAVENOUS

## 2021-02-11 MED ORDER — SODIUM CHLORIDE 0.9% IV SOLUTION: Freq: Once | INTRAVENOUS | Status: DC

## 2021-02-11 MED ORDER — ALBUMIN HUMAN 5 % IV SOLN
12.5000 g | Freq: Four times a day (QID) | INTRAVENOUS | Status: AC | PRN
  Administered 2021-02-12: 12.5 g via INTRAVENOUS
  Filled 2021-02-11: qty 250

## 2021-02-11 NOTE — Plan of Care (Signed)

## 2021-02-11 NOTE — Progress Notes (Addendum)
Tara [name] [id number] 1950-06-28  CARE TEAM:  PCP: Neale Burly, MD  Outpatient Care Team: Patient Care Team: Neale Burly, MD as PCP - General (Internal Medicine) Michael Boston, MD as Consulting Physician (General Surgery) Rogene Houston, MD as Consulting Physician (Gastroenterology) Chesley Mires, MD as Consulting Physician (Pulmonary Disease) [place], Jodelle Johnavon Mcclafferty, DO as Referring Physician (Cardiology)  Inpatient Treatment Team: Treatment Team: Attending Provider: Michael Boston, MD; [place] Nurse: Tenna Child, RN; Registered Nurse: Maryann Alar, RN; Pharmacist: Dimple Nanas, [facility]; Technician: Ernestina Patches, NT; Registered Nurse: Elyn Aquas, RN   Problem List:   Principal Problem:   Colovesical fistula Active Problems:   CKD (chronic kidney disease), stage III ([place])   Acute blood loss anemia (ABLA)   1 Day Post-Op  02/10/2021  POST-OPERATIVE DIAGNOSIS:   COLOVESICAL FISTULA PARTIAL COLON OBSTRUCTION   PROCEDURE:   ROBOTIC LOW ANTERIOR RECTOSIGMOID RESECTION  TAKEDOWN OF COLOVESICAL FISTULA DRAINAGE OF INTRAABDOMINAL ABSCESS TRANSVERSUS ABDOMINIS PLANE (TAP) BLOCK - BILATERAL INTRAOPERATIVE ASSESSMENT OF PERFUSION USING FIREFLY IMMUNOFLUORESCENCE RIGID PROCTOSCOPY   SURGEON:  Adin Hector, MD  OR FINDINGS:    Patient had very inflamed and torturous rectosigmoid colon with very dense adhesions to the left lateral pelvis and left dome of the bladder close to left ureteropelvic junction.  Small contained abscess in the region drained.   No obvious metastatic disease on visceral parietal peritoneum or liver.   The 31 EEA anastomosis rests 10 cm from the anal verge by rigid proctoscopy.   CASE DATA:   Type of patient?: Elective [facility] Private Case   Status of Case? Elective Scheduled   Infection Present At Time Of Surgery (PATOS)?  ABSCESS  Assessment  Symptomatic anemia most likely oozing at anastomosis.  [facility] Stay = 1  days)  Plan:  -Transfusing for acute blood loss anemia.  Should stabilize  -VTE prophylaxis- SCDs.  Hold enoxaparin until postop day 2  -Stop IV fluids and as needed albumin as needed.  Try and keep on the dry side.  Hold off on diuretics given symptomatic  -Keep in stepdown until more hemodynamically stable.  Low threshold to consult medicine/pulmonary if needed  -Loose bowel movements most likely are flexion of postobstructive diarrhea now that the strictured area causing the colovesical fistula has been removed.  Should calm down.  Fiber bowel regimen.  Okay to advance diet to dysphagia 1.  -Follow-up on pathology.  Hopefully just diverticulitis  -Keep Foley today.  Remove postop day 2 given colovesical fistula and need for urinary monitoring with increased creatinine in the setting of chronic kidney disease.  -mobilize as tolerated to help recovery.  Mobilize here in stepdown unit with nursing and monitoring closely to make sure she is stable.  Hypothyroidism.  Levothyroxine.  Chronic COPD.  Wean oxygen down.  Inhalers as needed.  Consider diuresis if hemoglobin more stable.  Keep on the dry side and use colloid for boluses as needed.  Disposition:  Disposition:  The patient is from: Home  Anticipate discharge to:  Home with Home Health  Anticipated Date of Discharge is:  December 11,2022    Barriers to discharge:  Pending Clinical improvement (more likely than not)  Patient currently is NOT MEDICALLY STABLE for discharge from the hospital from a surgery standpoint.      25 minutes spent in review, evaluation, examination, counseling, and coordination of care.   I have reviewed this patient's available data, including medical history, events of note, physical examination and test results  as part of my evaluation.  A significant portion of that time was spent in counseling.  Care during the described time interval was provided by me.  02/11/2021    Subjective: (Chief  complaint)  Patient denies much soreness.  Loose bowel movements.   bleeding from drain.  Mild borderline hypotension in the setting of chronic cardiac medications.  Low hemoglobin.  Getting transfused.  Patient awakens in no acute distress.  Denies any nausea or vomiting.  Nursing just outside room.  Discussed and updated with them  Objective:  Vital signs:  Vitals:   02/11/21 0520 02/11/21 0530 02/11/21 0600 02/11/21 0645  BP: (!) 99/42 (!) 91/47 (!) 91/51 (!) 91/40  Pulse: 62 87 (!) 54 60  Resp: 17 (!) 23 18 15   Temp: 97.6 F (36.4 C) 97.7 F (36.5 C) 97.7 F (36.5 C) 97.7 F (36.5 C)  TempSrc: Oral Oral Oral Oral  SpO2: 100% 98% 100% 100%  Weight:      Height:        Last BM Date: 02/10/21  Intake/Output   Yesterday:  12/07 0701 - 12/08 0700 In: 4828.1 [I.V.:2374.1; Blood:315; IV Piggyback:2139] Out: 740 [Urine:420; Drains:280; Blood:40] This shift:  No intake/output data recorded.  Bowel function:  Flatus: YES  BM:  YES  Drain: Serosanguinous   Physical Exam:  General: Pt awake/alert in no acute distress Eyes: PERRL, normal EOM.  Sclera clear.  No icterus Neuro: CN II-XII intact w/o focal sensory/motor deficits. Lymph: No head/neck/groin lymphadenopathy Psych:  No delerium/psychosis/paranoia.  Oriented x 4 HENT: Normocephalic, Mucus membranes moist.  No thrush Neck: Supple, No tracheal deviation.  No obvious thyromegaly Chest: No pain to chest wall compression.  Good respiratory excursion.  No audible wheezing CV:  Pulses intact.  Regular rhythm.  No major extremity edema MS: Normal AROM mjr joints.  No obvious deformity  Abdomen: Soft.  Moderately distended.  Tenderness at right lower quadrant drain site only .  No evidence of peritonitis.  No incarcerated hernias.  Ext:   No deformity.  No mjr edema.  No cyanosis Skin: No petechiae / purpurea.  No major sores.  Warm and dry    Results:   Cultures: Recent Results (from the past 720  hour(s))  SARS Coronavirus 2 (TAT 6-24 hrs)     Status: None   Collection Time: 02/08/21 12:00 AM  Result Value Ref Range Status   SARS Coronavirus 2 RESULT: NEGATIVE  Final    Comment: RESULT: NEGATIVESARS-CoV-2 INTERPRETATION:A NEGATIVE  test result means that SARS-CoV-2 RNA was not present in the specimen above the limit of detection of this test. This does not preclude a possible SARS-CoV-2 infection and should not be used as the  sole basis for patient management decisions. Negative results must be combined with clinical observations, patient history, and epidemiological information. Optimum specimen types and timing for peak viral levels during infections caused by SARS-CoV-2  have not been determined. Collection of multiple specimens or types of specimens may be necessary to detect virus. Improper specimen collection and handling, sequence variability under primers/probes, or organism present below the limit of detection may  lead to false negative results. Positive and negative predictive values of testing are highly dependent on prevalence. False negative test results are more likely when prevalence of disease is high.The expected result is NEGATIVE.Fact S heet for  Healthcare Providers: LocalChronicle.no Sheet for Patients: SalonLookup.es Reference Range - Negative   MRSA Next Gen by PCR, Nasal     Status: None  Collection Time: 02/10/21  6:41 PM   Specimen: Nasal Mucosa; Nasal Swab  Result Value Ref Range Status   MRSA by PCR Next Gen NOT DETECTED NOT DETECTED Final    Comment: (NOTE) The GeneXpert MRSA Assay (FDA approved for NASAL specimens only), is one component of a comprehensive MRSA colonization surveillance program. It is not intended to diagnose MRSA infection nor to guide or monitor treatment for MRSA infections. Test performance is not FDA approved in patients less than 36 years old. Performed at [facility], [place] [address] [place], [place] [address]     Labs: Results for orders placed or performed during the hospital encounter of 02/10/21 (from the past 48 hour(s))  Glucose, capillary     Status: None   Collection Time: 02/10/21 11:20 AM  Result Value Ref Range   Glucose-Capillary 84 70 - 99 mg/dL    Comment: Glucose reference range applies only to samples taken after fasting for at least 8 hours.  MRSA Next Gen by PCR, Nasal     Status: None   Collection Time: 02/10/21  6:41 PM   Specimen: Nasal Mucosa; Nasal Swab  Result Value Ref Range   MRSA by PCR Next Gen NOT DETECTED NOT DETECTED    Comment: (NOTE) The GeneXpert MRSA Assay (FDA approved for NASAL specimens only), is one component of a comprehensive MRSA colonization surveillance program. It is not intended to diagnose MRSA infection nor to guide or monitor treatment for MRSA infections. Test performance is not FDA approved in patients less than 37 years old. Performed at [facility], [place] [address], [place], [place] [address]   Glucose, capillary     Status: Abnormal   Collection Time: 02/10/21  8:19 PM  Result Value Ref Range   Glucose-Capillary 130 (H) 70 - 99 mg/dL    Comment: Glucose reference range applies only to samples taken after fasting for at least 8 hours.   Comment 1 Notify RN    Comment 2 Document in Chart   Basic metabolic panel     Status: Abnormal   Collection Time: 02/11/21  2:51 AM  Result Value Ref Range   Sodium 136 135 - 145 mmol/L   Potassium 4.1 3.5 - 5.1 mmol/L   Chloride 99 98 - 111 mmol/L   CO2 29 22 - 32 mmol/L   Glucose, Bld 136 (H) 70 - 99 mg/dL    Comment: Glucose reference range applies only to samples taken after fasting for at least 8 hours.   BUN 21 8 - 23 mg/dL   Creatinine, Ser 1.67 (H) 0.44 - 1.00 mg/dL   Calcium 8.7 (L) 8.9 - 10.3 mg/dL   GFR, Estimated 33 (L) >60 mL/min    Comment: (NOTE) Calculated using the CKD-EPI Creatinine Equation  (2021)    Anion gap 8 5 - 15    Comment: Performed at [facility], [place] [address], [place], [place] [address]  CBC     Status: Abnormal   Collection Time: 02/11/21  2:51 AM  Result Value Ref Range   WBC 7.9 4.0 - 10.5 K/uL   RBC 2.10 (L) 3.87 - 5.11 MIL/uL   Hemoglobin 6.9 (LL) 12.0 - 15.0 g/dL    Comment: REPEATED TO VERIFY THIS CRITICAL RESULT HAS VERIFIED AND BEEN CALLED TO B,DAVIS BY AISHA MOHAMED ON 12 08 2022 AT 0330, AND HAS BEEN READ BACK.     HCT 21.4 (L) 36.0 - 46.0 %   MCV 101.9 (H)  80.0 - 100.0 fL   MCH 32.9 26.0 - 34.0 pg   MCHC 32.2 30.0 - 36.0 g/dL   RDW 14.1 11.5 - 15.5 %   Platelets 137 (L) 150 - 400 K/uL   nRBC 0.0 0.0 - 0.2 %    Comment: Performed at [facility], [place] [address] [place], [place] [address]  Magnesium     Status: Abnormal   Collection Time: 02/11/21  2:51 AM  Result Value Ref Range   Magnesium 1.4 (L) 1.7 - 2.4 mg/dL    Comment: Performed at [facility], [place] [address] [place], [place] [address]  Prepare RBC (crossmatch)     Status: None   Collection Time: 02/11/21  4:16 AM  Result Value Ref Range   Order Confirmation      ORDER PROCESSED BY BLOOD BANK Performed at [place] [address] [place], [place] [address]     Imaging / Studies: No results found.  Medications / Allergies: per chart  Antibiotics: Anti-infectives (From admission, onward)    Start     Dose/Rate Route Frequency Ordered Stop   02/10/21 2200  cefoTEtan (CEFOTAN) 2 g in sodium chloride 0.9 % 100 mL IVPB        2 g 200 mL/hr over 30 Minutes Intravenous Every 12 hours 02/10/21 1839 02/10/21 2212   02/10/21 1400  neomycin (MYCIFRADIN) tablet 1,000 mg  Status:  Discontinued       See Hyperspace for full Linked Orders Report.   1,000 mg Oral 3 times per day 02/10/21 1112 02/10/21 1120   02/10/21 1400  metroNIDAZOLE (FLAGYL) tablet 1,000 mg  Status:  Discontinued       See Hyperspace  for full Linked Orders Report.   1,000 mg Oral 3 times per day 02/10/21 1112 02/10/21 1120   02/10/21 1115  cefoTEtan (CEFOTAN) 2 g in sodium chloride 0.9 % 100 mL IVPB        2 g 200 mL/hr over 30 Minutes Intravenous On call to O.R. 02/10/21 1112 02/10/21 1325         Note: Portions of this report may have been transcribed using voice recognition software. Every effort was made to ensure accuracy; however, inadvertent computerized transcription errors may be present.   Any transcriptional errors that result from this process are unintentional.    Adin Hector, MD, FACS, MASCRS Esophageal, Gastrointestinal & Colorectal Surgery Robotic and Minimally Invasive Surgery  [facility], [place]  [place]. [address], [place], [place] [address] [phone] Fax [phone] Main  CONTACT INFORMATION:  Weekday (9AM-5PM): Call [facility] main office at [phone]  Weeknight (5PM-9AM) or Weekend/Holiday: Check [email or web address] (password " [email or web address]") for General Surgery [facility] coverage  (Please, do not use SecureChat as it is not reliable communication to operating surgeons for immediate patient care)      02/11/2021  7:05 AM

## 2021-02-11 NOTE — Progress Notes (Signed)
PHARMACIST-PROVIDER COMMUNICATION  Loris Seelye. Juarez is POD 1 robotic low anterior rectosigmoid resection,  takedown of colovesical fistula, and drainage of intra-abdominal abscess prescribed Entereg post-op.  Documented bowel movements on 12/7 as well as bowel sounds.  Medication will be discontinued per protocol given return of bowel function.  Thank you for allowing pharmacy to participate in this patient's care.  Please reach out with any questions.  Dimple Nanas, PharmD 02/11/2021 2:58 PM

## 2021-02-11 NOTE — Consult Note (Signed)
[place] Nurse ostomy follow up Surgical team following for assessment and plan of care.  Pt did not receive an ostomy during surgery yesterday; no further role for WOC team. Please re-consult if further assistance is needed.  Thank-you,  Julien Girt MSN, [place], [place], [place], [place]

## 2021-02-12 DIAGNOSIS — D62 Acute posthemorrhagic anemia: Secondary | ICD-10-CM | POA: Diagnosis not present

## 2021-02-12 LAB — HEMOGLOBIN AND HEMATOCRIT, BLOOD
HCT: 32.4 % — ABNORMAL LOW (ref 36.0–46.0)
Hemoglobin: 10.8 g/dL — ABNORMAL LOW (ref 12.0–15.0)

## 2021-02-12 LAB — SURGICAL PATHOLOGY

## 2021-02-12 LAB — PREPARE RBC (CROSSMATCH)

## 2021-02-12 LAB — POTASSIUM: Potassium: 4.4 mmol/L (ref 3.5–5.1)

## 2021-02-12 LAB — CREATININE, SERUM
Creatinine, Ser: 1.78 mg/dL — ABNORMAL HIGH (ref 0.44–1.00)
GFR, Estimated: 31 mL/min — ABNORMAL LOW (ref >60)

## 2021-02-12 LAB — HEMOGLOBIN: Hemoglobin: 6 g/dL — CL (ref 12.0–15.0)

## 2021-02-12 MED ORDER — FUROSEMIDE 10 MG/ML IJ SOLN
40.0000 mg | Freq: Once | INTRAMUSCULAR | Status: AC
  Administered 2021-02-12: 40 mg via INTRAVENOUS
  Filled 2021-02-12: qty 4

## 2021-02-12 MED ORDER — ALBUTEROL SULFATE (2.5 MG/3ML) 0.083% IN NEBU
2.5000 mg | INHALATION_SOLUTION | Freq: Four times a day (QID) | RESPIRATORY_TRACT | Status: DC
  Administered 2021-02-12 – 2021-02-13 (×2): 2.5 mg via RESPIRATORY_TRACT
  Filled 2021-02-12 (×2): qty 3

## 2021-02-12 MED ORDER — SODIUM CHLORIDE 0.9% IV SOLUTION: Freq: Once | INTRAVENOUS | Status: AC

## 2021-02-12 MED ORDER — GABAPENTIN 100 MG PO CAPS
100.0000 mg | ORAL_CAPSULE | Freq: Three times a day (TID) | ORAL | Status: DC
  Administered 2021-02-12 – 2021-02-15 (×10): 100 mg via ORAL
  Filled 2021-02-12 (×10): qty 1

## 2021-02-12 MED ORDER — ALVIMOPAN 12 MG PO CAPS
12.0000 mg | ORAL_CAPSULE | Freq: Two times a day (BID) | ORAL | Status: DC
  Administered 2021-02-12 – 2021-02-13 (×2): 12 mg via ORAL
  Filled 2021-02-12 (×4): qty 1

## 2021-02-12 MED ORDER — GUAIFENESIN ER 600 MG PO TB12
1200.0000 mg | ORAL_TABLET | Freq: Two times a day (BID) | ORAL | Status: DC
  Administered 2021-02-12 – 2021-02-15 (×6): 1200 mg via ORAL
  Filled 2021-02-12 (×6): qty 2

## 2021-02-12 MED ORDER — ALBUTEROL SULFATE (2.5 MG/3ML) 0.083% IN NEBU: 2.5000 mg | INHALATION_SOLUTION | Freq: Four times a day (QID) | RESPIRATORY_TRACT | Status: DC

## 2021-02-12 NOTE — Progress Notes (Signed)
Tara [name] [id number] 1950/08/22  CARE TEAM:  PCP: Neale Burly, MD  Outpatient Care Team: Patient Care Team: Neale Burly, MD as PCP - General (Internal Medicine) Michael Boston, MD as Consulting Physician (General Surgery) Rogene Houston, MD as Consulting Physician (Gastroenterology) Chesley Mires, MD as Consulting Physician (Pulmonary Disease) Assar, Jodelle Shina Wass, DO as Referring Physician (Cardiology)  Inpatient Treatment Team: Treatment Team: Attending Provider: Michael Boston, MD; Charge Nurse: Quincy Simmonds, RN; Technician: Ward, Imagene Riches, NT; Technician: Kipp Laurence, NT; Registered Nurse: Kathreen Devoid, RN; Pharmacist: Lynelle Doctor, [facility]; Registered Nurse: Donnamarie Rossetti I, RN   Problem List:   Principal Problem:   Colovesical fistula Active Problems:   CKD (chronic kidney disease), stage III (HCC)   Acute blood loss anemia (ABLA)   2 Days Post-Op  02/10/2021  POST-OPERATIVE DIAGNOSIS:   COLOVESICAL FISTULA PARTIAL COLON OBSTRUCTION   PROCEDURE:   ROBOTIC LOW ANTERIOR RECTOSIGMOID RESECTION  TAKEDOWN OF COLOVESICAL FISTULA DRAINAGE OF INTRAABDOMINAL ABSCESS TRANSVERSUS ABDOMINIS PLANE (TAP) BLOCK - BILATERAL INTRAOPERATIVE ASSESSMENT OF PERFUSION USING FIREFLY IMMUNOFLUORESCENCE RIGID PROCTOSCOPY   SURGEON:  Adin Hector, MD  OR FINDINGS:    Patient had very inflamed and torturous rectosigmoid colon with very dense adhesions to the left lateral pelvis and left dome of the bladder close to left ureteropelvic junction.  Small contained abscess in the region drained.   No obvious metastatic disease on visceral parietal peritoneum or liver.   The 31 EEA anastomosis rests 10 cm from the anal verge by rigid proctoscopy.   CASE DATA:   Type of patient?: Elective [facility] Private Case   Status of Case? Elective Scheduled   Infection Present At Time Of Surgery (PATOS)?  ABSCESS  Assessment  Symptomatic anemia most likely oozing at  anastomosis.  [facility] Stay = 2 days)  Plan:  -Transfusing for acute blood loss anemia again.  Should stabilize  -Lasix x1.  -VTE prophylaxis- SCDs.  Hold enoxaparin again  -Stop IV fluids and as needed albumin as needed.  Try and keep on the dry side.  Hold off on diuretics given symptomatic  -Keep in stepdown until more hemodynamically stable.    Will request medicine/pulmonary  -Question of loose bowel movements but no strong evidence right now.  Suspect she is at risk for ileus.  I think alvimopan was prematurely discontinued.  Resume.  Revisit in the next day or so depending how things go.  Keep to dysphagia 1/full liquid diet only for now.  N.p.o. NG tube if worse   -Follow-up on pathology.  Hopefully just diverticulitis  -Keep Foley another day to allow diuresis and close urine output monitoring in the setting of elevated creatinine with chronic kidney disease and need for transfusion and diuretics   -mobilize as tolerated to help recovery.  Patient claims no one helped her up but in reality is that she declined nursing intervention.  Encouraged her to get up to a chair at least to see how she can handle that.  With help from a pulmonary standpoint as well  mobilize here in stepdown unit with nursing and monitoring closely to make sure she is stable.  Hypothyroidism.  Levothyroxine.  Chronic COPD.  Wean oxygen down.  Inhalers as needed.  Consider diuresis if hemoglobin more stable.  Keep on the dry side and use colloid for boluses as needed.  Disposition:  Disposition:  The patient is from: Home  Anticipate discharge to:  Home with Home Health  Anticipated Date of Discharge  is:  December 11,2022    Barriers to discharge:  Pending Clinical improvement (more likely than not)  Patient currently is NOT MEDICALLY STABLE for discharge from the hospital from a surgery standpoint.      25 minutes spent in review, evaluation, examination, counseling, and coordination  of care.   I have reviewed this patient's available data, including medical history, events of note, physical examination and test results as part of my evaluation.  A significant portion of that time was spent in counseling.  Care during the described time interval was provided by me.  02/12/2021    Subjective: (Chief complaint)  Patient more sore in her lower abdomen.  Denies any nausea.  Tolerating full liquids.  Drift down in hemoglobin again.  Feels more bloated.  Objective:  Vital signs:  Vitals:   02/12/21 0443 02/12/21 0445 02/12/21 0500 02/12/21 0507  BP:  (!) 100/57  105/67  Pulse:  76  88  Resp:  17  (!) 22  Temp: 98 F (36.7 C) (!) 97.3 F (36.3 C)  (!) 97.5 F (36.4 C)  TempSrc: Axillary Axillary    SpO2:  96%    Weight:   63 kg   Height:        Last BM Date: 02/11/21  Intake/Output   Yesterday:  12/08 0701 - 12/09 0700 In: -  Out: 1360 [Urine:1025; Drains:335] This shift:  No intake/output data recorded.  Bowel function:  Flatus: YES  BM:  YES  Drain: Serosanguinous -primarily old clot.  Stripped.   Physical Exam:  General: Pt awake/alert in no acute distress Eyes: PERRL, normal EOM.  Sclera clear.  No icterus Neuro: CN II-XII intact w/o focal sensory/motor deficits. Lymph: No head/neck/groin lymphadenopathy Psych:  No delerium/psychosis/paranoia.  Oriented x 4 HENT: Normocephalic, Mucus membranes moist.  No thrush Neck: Supple, No tracheal deviation.  No obvious thyromegaly Chest: No pain to chest wall compression.  Good respiratory excursion.  No audible wheezing CV:  Pulses intact.  Regular rhythm.  No major extremity edema MS: Normal AROM mjr joints.  No obvious deformity  Abdomen: Soft.  Moderately distended.  Tenderness at Pfannenstiel incision and lower abdomen.  Upper abdomen not tender. .  No evidence of peritonitis.  No incarcerated hernias.  Ext:   No deformity.  Mild edema.  No cyanosis Skin: No petechiae / purpurea.  No  major sores.  Warm and dry    Results:   Cultures: Recent Results (from the past 720 hour(s))  SARS Coronavirus 2 (TAT 6-24 hrs)     Status: None   Collection Time: 02/08/21 12:00 AM  Result Value Ref Range Status   SARS Coronavirus 2 RESULT: NEGATIVE  Final    Comment: RESULT: NEGATIVESARS-CoV-2 INTERPRETATION:A NEGATIVE  test result means that SARS-CoV-2 RNA was not present in the specimen above the limit of detection of this test. This does not preclude a possible SARS-CoV-2 infection and should not be used as the  sole basis for patient management decisions. Negative results must be combined with clinical observations, patient history, and epidemiological information. Optimum specimen types and timing for peak viral levels during infections caused by SARS-CoV-2  have not been determined. Collection of multiple specimens or types of specimens may be necessary to detect virus. Improper specimen collection and handling, sequence variability under primers/probes, or organism present below the limit of detection may  lead to false negative results. Positive and negative predictive values of testing are highly dependent on prevalence. False negative test results are more  likely when prevalence of disease is high.The expected result is NEGATIVE.Fact S heet for  Healthcare Providers: LocalChronicle.no Sheet for Patients: SalonLookup.es Reference Range - Negative   MRSA Next Gen by PCR, Nasal     Status: None   Collection Time: 02/10/21  6:41 PM   Specimen: Nasal Mucosa; Nasal Swab  Result Value Ref Range Status   MRSA by PCR Next Gen NOT DETECTED NOT DETECTED Final    Comment: (NOTE) The GeneXpert MRSA Assay (FDA approved for NASAL specimens only), is one component of a comprehensive MRSA colonization surveillance program. It is not intended to diagnose MRSA infection nor to guide or monitor treatment for MRSA infections. Test  performance is not FDA approved in patients less than 57 years old. Performed at [facility], [place] [address], [place], [place] [address]     Labs: Results for orders placed or performed during the hospital encounter of 02/10/21 (from the past 48 hour(s))  Glucose, capillary     Status: None   Collection Time: 02/10/21 11:20 AM  Result Value Ref Range   Glucose-Capillary 84 70 - 99 mg/dL    Comment: Glucose reference range applies only to samples taken after fasting for at least 8 hours.  MRSA Next Gen by PCR, Nasal     Status: None   Collection Time: 02/10/21  6:41 PM   Specimen: Nasal Mucosa; Nasal Swab  Result Value Ref Range   MRSA by PCR Next Gen NOT DETECTED NOT DETECTED    Comment: (NOTE) The GeneXpert MRSA Assay (FDA approved for NASAL specimens only), is one component of a comprehensive MRSA colonization surveillance program. It is not intended to diagnose MRSA infection nor to guide or monitor treatment for MRSA infections. Test performance is not FDA approved in patients less than 42 years old. Performed at [facility], [place] [address], [place], [place] [address]   Glucose, capillary     Status: Abnormal   Collection Time: 02/10/21  8:19 PM  Result Value Ref Range   Glucose-Capillary 130 (H) 70 - 99 mg/dL    Comment: Glucose reference range applies only to samples taken after fasting for at least 8 hours.   Comment 1 Notify RN    Comment 2 Document in Chart   Basic metabolic panel     Status: Abnormal   Collection Time: 02/11/21  2:51 AM  Result Value Ref Range   Sodium 136 135 - 145 mmol/L   Potassium 4.1 3.5 - 5.1 mmol/L   Chloride 99 98 - 111 mmol/L   CO2 29 22 - 32 mmol/L   Glucose, Bld 136 (H) 70 - 99 mg/dL    Comment: Glucose reference range applies only to samples taken after fasting for at least 8 hours.   BUN 21 8 - 23 mg/dL   Creatinine, Ser 1.67 (H) 0.44 - 1.00 mg/dL   Calcium 8.7 (L) 8.9 - 10.3 mg/dL   GFR,  Estimated 33 (L) >60 mL/min    Comment: (NOTE) Calculated using the CKD-EPI Creatinine Equation (2021)    Anion gap 8 5 - 15    Comment: Performed at [facility], [place] [address], [place],  [address]  CBC     Status: Abnormal   Collection Time: 02/11/21  2:51 AM  Result Value Ref Range   WBC 7.9 4.0 - 10.5 K/uL   RBC 2.10 (L) 3.87 - 5.11 MIL/uL   Hemoglobin 6.9 (LL) 12.0 - 15.0 g/dL    Comment: REPEATED  TO VERIFY THIS CRITICAL RESULT HAS VERIFIED AND BEEN CALLED TO B,DAVIS BY AISHA MOHAMED ON 12 08 2022 AT 0330, AND HAS BEEN READ BACK.     HCT 21.4 (L) 36.0 - 46.0 %   MCV 101.9 (H) 80.0 - 100.0 fL   MCH 32.9 26.0 - 34.0 pg   MCHC 32.2 30.0 - 36.0 g/dL   RDW 14.1 11.5 - 15.5 %   Platelets 137 (L) 150 - 400 K/uL   nRBC 0.0 0.0 - 0.2 %    Comment: Performed at [facility], [place] [address], [place], [place] [address]  Magnesium     Status: Abnormal   Collection Time: 02/11/21  2:51 AM  Result Value Ref Range   Magnesium 1.4 (L) 1.7 - 2.4 mg/dL    Comment: Performed at [facility], [place] [address] [place], [place] [address]  Prepare RBC (crossmatch)     Status: None   Collection Time: 02/11/21  4:16 AM  Result Value Ref Range   Order Confirmation      ORDER PROCESSED BY BLOOD BANK Performed at [place] [address] [place], [place] [address]   Hemoglobin and hematocrit, blood     Status: Abnormal   Collection Time: 02/11/21  9:00 AM  Result Value Ref Range   Hemoglobin 8.4 (L) 12.0 - 15.0 g/dL   HCT 25.3 (L) 36.0 - 46.0 %    Comment: Performed at [facility], [place] [address] [place], [place] [address]  Hemoglobin     Status: Abnormal   Collection Time: 02/12/21  2:45 AM  Result Value Ref Range   Hemoglobin 6.0 (LL) 12.0 - 15.0 g/dL    Comment: REPEATED TO VERIFY THIS CRITICAL RESULT HAS VERIFIED AND BEEN CALLED TO RN A CHAVEZ BY ALEXIS Seaboard ON 12 09 2022 AT  0346, AND HAS BEEN READ BACK.  Performed at [facility], [place] [address] [place], [place] [address]   Potassium     Status: None   Collection Time: 02/12/21  2:45 AM  Result Value Ref Range   Potassium 4.4 3.5 - 5.1 mmol/L    Comment: Performed at [facility], [place] [address] [place], [place] [address]  Creatinine, serum     Status: Abnormal   Collection Time: 02/12/21  2:45 AM  Result Value Ref Range   Creatinine, Ser 1.78 (H) 0.44 - 1.00 mg/dL   GFR, Estimated 31 (L) >60 mL/min    Comment: (NOTE) Calculated using the CKD-EPI Creatinine Equation (2021) Performed at [facility], [place] [address] [place], [place] [address]   Prepare RBC (crossmatch)     Status: None   Collection Time: 02/12/21  4:20 AM  Result Value Ref Range   Order Confirmation      ORDER PROCESSED BY BLOOD BANK Performed at [place] [address], [place], [place] [address]     Imaging / Studies: No results found.  Medications / Allergies: per chart  Antibiotics: Anti-infectives (From admission, onward)    Start     Dose/Rate Route Frequency Ordered Stop   02/10/21 2200  cefoTEtan (CEFOTAN) 2 g in sodium chloride 0.9 % 100 mL IVPB        2 g 200 mL/hr over 30 Minutes Intravenous Every 12 hours 02/10/21 1839 02/10/21 2212   02/10/21 1400  neomycin (MYCIFRADIN) tablet 1,000 mg  Status:  Discontinued       See Hyperspace for full  Linked Orders Report.   1,000 mg Oral 3 times per day 02/10/21 1112 02/10/21 1120   02/10/21 1400  metroNIDAZOLE (FLAGYL) tablet 1,000 mg  Status:  Discontinued       See Hyperspace for full Linked Orders Report.   1,000 mg Oral 3 times per day 02/10/21 1112 02/10/21 1120   02/10/21 1115  cefoTEtan (CEFOTAN) 2 g in sodium chloride 0.9 % 100 mL IVPB        2 g 200 mL/hr over 30 Minutes Intravenous On call to O.R. 02/10/21 1112 02/10/21 1325         Note: Portions of this report may have  been transcribed using voice recognition software. Every effort was made to ensure accuracy; however, inadvertent computerized transcription errors may be present.   Any transcriptional errors that result from this process are unintentional.    Adin Hector, MD, FACS, MASCRS Esophageal, Gastrointestinal & Colorectal Surgery Robotic and Minimally Invasive Surgery  [facility], [place]. [address], [place], [place] [address] [phone] Fax [phone] Main  CONTACT INFORMATION:  Weekday (9AM-5PM): Call [facility] main office at [phone]  Weeknight (5PM-9AM) or Weekend/Holiday: Check [email or web address] (password " [email or web address]") for General Surgery [facility] coverage  (Please, do not use SecureChat as it is not reliable communication to operating surgeons for immediate patient care)      02/12/2021  7:11 AM

## 2021-02-12 NOTE — Consult Note (Signed)
NAME:  Tara [name], MRN:  [id number], DOB:  May 15, 1950, LOS: 2 ADMISSION DATE:  02/10/2021, CONSULTATION DATE:  02/12/2021 REFERRING MD:  Dr. Johney Maine, CHIEF COMPLAINT:  COPD   History of Present Illness:  Tara [name] is a 70 y.o. female with a past medical history significant for COPD, daily tobacco use, CHF, arthritis, sleep apnea, hypothyroidism, and prediabetes presented for elective robotic colon resection and ostomy establishment for colovesicular fistula with Dr. Johney Maine 12/7.  Per chart review patient was diagnosed with abscess along the sigmoid: Suspicious for diverticulitis versus colitis which was treated with 1 month IV antibiotics followed by 6 weeks oral antibiotics.  CT abdomen was obtained and revealed gas in the bladder suspicious for colovesicular fistula.  Postoperatively patient was seen with symptomatic anemia most likely oozing anastomosis site.  Critical care consulted for assistance in medical management.  Pertinent  Medical History  COPD, CHF, sleep apnea, hypothyroidism, prediabetes, arthritis, CHF  Significant Hospital Events: Including procedures, antibiotic start and stop dates in addition to other pertinent events   12/7 elective colon resection with colostomy formation per Dr. Johney Maine 12/9 critical care consulted for medical management  Interim History / Subjective:  Seen lying in bed with no acute compliants   Objective   Blood pressure 122/62, pulse 80, temperature 98 F (36.7 C), resp. rate 19, height 5\' 1"  (1.549 m), weight 63 kg, SpO2 96 %.        Intake/Output Summary (Last 24 hours) at 02/12/2021 1138 Last data filed at 02/12/2021 1000 Gross per 24 hour  Intake 1227.44 ml  Output 3230 ml  Net -2002.56 ml   Filed Weights   02/10/21 1129 02/11/21 0500 02/12/21 0500  Weight: 53.5 kg 63.6 kg 63 kg    Examination: General: Acute on chronic ill appearing middle aged female lying in bed in NAD HEENT: [place]/ATT, MM pink/moist, PERRL,  Neuro: Alert and  oriented x3, non-focal CV: s1s2 regular rate and rhythm, no murmur, rubs, or gallops,  PULM:  Clear to ascultation bilaterally, no increased work of breathing, no added breath sounds GI: soft, bowel sounds active in all 4 quadrants, non-tender, non-distended, tolerating oral diet Extremities: warm/dry, no edema  Skin: no rashes or lesions  Resolved Hospital Problem list     Assessment & Plan:  Colovesicular fistula Partial colon obstruction -Underwent robotic rectosigmoid resection with takedown of colovesicular fistula with Dr. Johney Maine 12/7.  Intra-abdominal abscess also drained P: Primary management per general surgery Supportive care Advancement of diet per general surgery Continue empiric antibiotics Pain control  History of COPD -06/03/19 - FVC 1.96 (71%), FEV1 1.15 (55%), ratio 59, TLC 138% Moderately severe obstructive airway disease with air trapping  Daily tobacco use -Half a pack per day P: Aggressive pulmonary hygiene Mobilize as able Aspiration precautions Bronchodilators Tobacco cessation education  Diastolic congestive heart failure -Echocardiogram April 2022 revealed EF of 65 to 70% with grade 1 diastolic dysfunction.  No regional wall motion abnormality Atrial fibrillation -Anticoagulated at baseline with Eliquis P: Continuous telemetry  Continue home aspirin and home monitor telemetry exam Given downtrend in hemoglobin continue to hold home Eliquis, resume per general surgery Heart health diet with sodium restriction when able  Strict intake and output  Daily weight to assess volume status Daily assessment for need to diurese with the use of IV lasix   Closely monitor renal function and electrolytes  Provide supplemtal oxygen as needed to maintain oxygen saturations above 90% Ensure hemodynamic control   Acute blood loss anemia superimposed  on anemia of chronic disease -Globin down trended to 6.0 a.m. of 12.9 P: Status post 1 unit PRBC Trend  CBC Monitor for signs of bleeding Hemoglobin goal greater than 7  CKD stage IIIb -Creatinine fluctuates between 1.5-1.8 with GFR ranging in the mid 30s P: Renal function at baseline Follow renal function  Monitor urine output Trend Bmet Avoid nephrotoxins Ensure adequate renal perfusion    Best Practice (right click and "Reselect all SmartList Selections" daily)   Diet/type: dysphagia diet (see orders) DVT prophylaxis: SCD GI prophylaxis: PPI Lines: N/A Foley:  N/A Code Status:  full code Last date of multidisciplinary goals of care discussion Per primary   Labs   CBC: Recent Labs  Lab 02/11/21 0251 02/11/21 0900 02/12/21 0245  WBC 7.9  --   --   HGB 6.9* 8.4* 6.0*  HCT 21.4* 25.3*  --   MCV 101.9*  --   --   PLT 137*  --   --     Basic Metabolic Panel: Recent Labs  Lab 02/11/21 0251 02/12/21 0245  NA 136  --   K 4.1 4.4  CL 99  --   CO2 29  --   GLUCOSE 136*  --   BUN 21  --   CREATININE 1.67* 1.78*  CALCIUM 8.7*  --   MG 1.4*  --    GFR: Estimated Creatinine Clearance: 25.4 mL/min (A) (by C-G formula based on SCr of 1.78 mg/dL (H)). Recent Labs  Lab 02/11/21 0251  WBC 7.9    Liver Function Tests: No results for input(s): AST, ALT, ALKPHOS, BILITOT, PROT, ALBUMIN in the last 168 hours. No results for input(s): LIPASE, AMYLASE in the last 168 hours. No results for input(s): AMMONIA in the last 168 hours.  ABG    Component Value Date/Time   PHART 7.356 07/04/2020 0501   PCO2ART 43.9 07/04/2020 0501   PO2ART 81.0 (L) 07/04/2020 0501   HCO3 23.5 07/04/2020 0501   TCO2 32 07/15/2020 1800   ACIDBASEDEF 0.9 07/04/2020 0501   O2SAT 96.3 07/04/2020 0501     Coagulation Profile: No results for input(s): INR, PROTIME in the last 168 hours.  Cardiac Enzymes: No results for input(s): CKTOTAL, CKMB, CKMBINDEX, TROPONINI in the last 168 hours.  HbA1C: Hgb A1c MFr Bld  Date/Time Value Ref Range Status  02/04/2021 02:46 PM 5.8 (H) 4.8 - 5.6 %  Final    Comment:    (NOTE) Pre diabetes:          5.7%-6.4%  Diabetes:              >6.4%  Glycemic control for   <7.0% adults with diabetes   07/06/2020 06:32 AM 5.4 4.8 - 5.6 % Final    Comment:    (NOTE) Pre diabetes:          5.7%-6.4%  Diabetes:              >6.4%  Glycemic control for   <7.0% adults with diabetes     CBG: Recent Labs  Lab 02/10/21 1120 02/10/21 2019  GLUCAP 84 130*    Review of Systems:   Please see the history of present illness. All other systems reviewed and are negative    Past Medical History:  She,  has a past medical history of Arthritis, CHF (congestive heart failure) ([place]), COPD (chronic obstructive pulmonary disease) ([place]), sleep apnea (12/07/2020), Hypothyroidism, and Pre-diabetes.   Surgical History:   Past Surgical History:  Procedure Laterality Date  ABDOMINAL HYSTERECTOMY  1985   BSO and appy   BACK SURGERY  2006   rod and screws   CHOLECYSTECTOMY  1995   OSTOMY N/A 02/10/2021   Procedure: POSSIBLE OSTOMY;  Surgeon: Michael Boston, MD;  Location: [facility];  Service: General;  Laterality: N/A;   PROCTOSCOPY N/A 02/10/2021   Procedure: RIGID PROCTOSCOPY;  Surgeon: Michael Boston, MD;  Location: [facility];  Service: General;  Laterality: N/A;   TUBAL LIGATION  1977     Social History:   reports that she has been smoking cigarettes. She has a 28.50 pack-year smoking history. She has never used smokeless tobacco. She reports that she does not currently use alcohol. She reports that she does not use drugs.   Family History:  Her family history is not on file.   Allergies Allergies  Allergen Reactions   Codeine Nausea And Vomiting    Patient reports that it makes her nauseated.      Home Medications  Prior to Admission medications   Medication Sig Start Date End Date Taking? Authorizing Provider  aspirin EC 81 MG tablet Take 81 mg by mouth at bedtime. Swallow whole.   Yes [provider]  atorvastatin (LIPITOR) 20  MG tablet Take 20 mg by mouth at bedtime.   Yes [provider]  Cholecalciferol (VITAMIN D3) 10 MCG (400 UNIT) CAPS Take 800 Units by mouth daily.   Yes [provider]  diltiazem (CARDIZEM CD) 120 MG 24 hr capsule Take 120 mg by mouth daily. 12/10/20  Yes [provider]  ELIQUIS 5 MG TABS tablet Take 2.5 mg by mouth 2 (two) times daily. 04/15/20  Yes [provider]  EUTHYROX 50 MCG tablet Take 50 mcg by mouth daily before breakfast. 07/01/20  Yes [provider]  furosemide (LASIX) 20 MG tablet Take 1 tablet (20 mg total) by mouth daily. Patient taking differently: Take 20 mg by mouth at bedtime. 07/06/20 07/06/21 Yes Shahmehdi, Valeria Batman, MD  Krill Oil 1000 MG CAPS Take 1,000 mg by mouth daily.   Yes [provider]  levalbuterol (XOPENEX) 0.63 MG/3ML nebulizer solution Take 0.63 mg by nebulization every 4 (four) hours as needed for wheezing or shortness of breath.   Yes [provider]  LORazepam (ATIVAN) 0.5 MG tablet Take 0.5 mg by mouth 2 (two) times daily. 05/11/20  Yes [provider]  Omega-3 Fatty Acids (FISH OIL) 1000 MG CAPS Take 1,000 mg by mouth in the morning and at bedtime.   Yes [provider]  pantoprazole (PROTONIX) 40 MG tablet Take 40 mg by mouth in the morning. 04/21/20  Yes [provider]  potassium chloride SA (KLOR-CON M) 20 MEQ tablet Take 20 mEq by mouth at bedtime.   Yes [provider]  Tiotropium Bromide Monohydrate (SPIRIVA RESPIMAT) 2.5 MCG/ACT AERS Inhale 2 puffs into the lungs daily. 12/07/20  Yes Martyn Ehrich, NP  tiZANidine (ZANAFLEX) 4 MG capsule Take 4 mg by mouth at bedtime.   Yes [provider]  Tiotropium Bromide Monohydrate (SPIRIVA RESPIMAT) 2.5 MCG/ACT AERS Inhale 2 puffs into the lungs daily. Patient not taking: Reported on 01/26/2021 12/07/20   Martyn Ehrich, NP     Critical care time: NA  Yaneli Keithley D. Kenton Kingfisher, NP-C [facility] Personal contact information can be found on Amion  02/12/2021, 12:03 PM

## 2021-02-12 NOTE — Plan of Care (Signed)
  Problem: Nutrition: Goal: Adequate nutrition will be maintained Outcome: Progressing  Ate 100 percent of dinner meal

## 2021-02-13 DIAGNOSIS — J449 Chronic obstructive pulmonary disease, unspecified: Secondary | ICD-10-CM

## 2021-02-13 DIAGNOSIS — J9611 Chronic respiratory failure with hypoxia: Secondary | ICD-10-CM

## 2021-02-13 DIAGNOSIS — N321 Vesicointestinal fistula: Secondary | ICD-10-CM | POA: Diagnosis not present

## 2021-02-13 DIAGNOSIS — D62 Acute posthemorrhagic anemia: Secondary | ICD-10-CM | POA: Diagnosis not present

## 2021-02-13 LAB — HEMOGLOBIN: Hemoglobin: 10.1 g/dL — ABNORMAL LOW (ref 12.0–15.0)

## 2021-02-13 MED ORDER — IPRATROPIUM-ALBUTEROL 0.5-2.5 (3) MG/3ML IN SOLN
3.0000 mL | Freq: Four times a day (QID) | RESPIRATORY_TRACT | Status: DC
  Administered 2021-02-13 – 2021-02-14 (×4): 3 mL via RESPIRATORY_TRACT
  Filled 2021-02-13 (×5): qty 3

## 2021-02-13 MED ORDER — IPRATROPIUM-ALBUTEROL 0.5-2.5 (3) MG/3ML IN SOLN: 3.0000 mL | Freq: Four times a day (QID) | RESPIRATORY_TRACT | Status: DC

## 2021-02-13 NOTE — Progress Notes (Signed)
Tara [name] [id number] 23-Dec-1950  CARE TEAM:  PCP: Neale Burly, MD  Outpatient Care Team: Patient Care Team: Neale Burly, MD as PCP - General (Internal Medicine) Michael Boston, MD as Consulting Physician (General Surgery) Rogene Houston, MD as Consulting Physician (Gastroenterology) Chesley Mires, MD as Consulting Physician (Pulmonary Disease) Assar, Jodelle Gross, DO as Referring Physician (Cardiology)  Inpatient Treatment Team: Treatment Team: Attending Provider: Michael Boston, MD; Consulting Physician: Pccm, Md, MD; Nurse Practitioner: Rico Ala, NP; Nurse Practitioner: Minor, Ferrel Logan., RN; Rounding Team: Pccm, Md, MD; Charge Nurse: Kathy Breach, RN; Technician: Benard Halsted, NT; Technician: Ward, Imagene Riches, NT; Registered Nurse: Jacqulyn Ducking, RN; Utilization Review: Claudie Leach, RN; Registered Nurse: Vernelle Emerald, RN   Problem List:   Principal Problem:   Colovesical fistula Active Problems:   Chronic respiratory failure with hypoxia (HCC)   Cigarette smoker   CKD (chronic kidney disease), stage III ([place])   Moderate COPD (chronic obstructive pulmonary disease) ([place])   Acute blood loss anemia ([facility])   3 Days Post-Op  02/10/2021  POST-OPERATIVE DIAGNOSIS:   COLOVESICAL FISTULA PARTIAL COLON OBSTRUCTION   PROCEDURE:   ROBOTIC LOW ANTERIOR RECTOSIGMOID RESECTION  TAKEDOWN OF COLOVESICAL FISTULA DRAINAGE OF INTRAABDOMINAL ABSCESS TRANSVERSUS ABDOMINIS PLANE (TAP) BLOCK - BILATERAL INTRAOPERATIVE ASSESSMENT OF PERFUSION USING FIREFLY IMMUNOFLUORESCENCE RIGID PROCTOSCOPY   SURGEON:  Adin Hector, MD  OR FINDINGS:    Patient had very inflamed and torturous rectosigmoid colon with very dense adhesions to the left lateral pelvis and left dome of the bladder close to left ureteropelvic junction.  Small contained abscess in the region drained.   No obvious metastatic disease on visceral parietal peritoneum or liver.   The  31 EEA anastomosis rests 10 cm from the anal verge by rigid proctoscopy.   CASE DATA:   Type of patient?: Elective [facility] Private Case   Status of Case? Elective Scheduled   Infection Present At Time Of Surgery (PATOS)?  ABSCESS  Assessment  Hgb stable after transfusion  [facility] Stay = 3 days)  Plan:  -Transfused for acute blood loss anemia   -VTE prophylaxis- SCDs.  Hold enoxaparin again  -Keep in stepdown for now, possible floor transfer tomorrow if hgb remains stable    Appreciate medicine/pulmonary assistance  -Question of loose bowel movements but no strong evidence right now.  Suspect she is at risk for ileus.  I think alvimopan was prematurely discontinued.  Resume.  Revisit in the next day or so depending how things go.  Keep to dysphagia 1/full liquid diet only for now.  N.p.o. NG tube if worse   -Follow-up on pathology.  Hopefully just diverticulitis  -Keep Foley another day to allow diuresis and close urine output monitoring in the setting of elevated creatinine with chronic kidney disease and need for transfusion and diuretics   -mobilize as tolerated to help recovery.  Patient claims no one helped her up but in reality is that she declined nursing intervention.  Encouraged her to get up to a chair at least to see how she can handle that.  With help from a pulmonary standpoint as well  mobilize here in stepdown unit with nursing and monitoring closely to make sure she is stable.  Hypothyroidism.  Levothyroxine.  Chronic COPD.  Wean oxygen down.  Inhalers as needed.  Consider diuresis if hemoglobin more stable.  Keep on the dry side and use colloid for boluses as needed.  Disposition:  Disposition:  The patient is from:  Home  Anticipate discharge to:  Home with Home Health  Anticipated Date of Discharge is:  December 11,2022    Barriers to discharge:  Pending Clinical improvement (more likely than not)  Patient currently is NOT MEDICALLY STABLE for  discharge from the hospital from a surgery standpoint.      25 minutes spent in review, evaluation, examination, counseling, and coordination of care.   I have reviewed this patient's available data, including medical history, events of note, physical examination and test results as part of my evaluation.  A significant portion of that time was spent in counseling.  Care during the described time interval was provided by me.  02/13/2021    Subjective: (Chief complaint)  Still with bloating and some nausea. Endorses flatus and BM. Sat up in chair.  Objective:  Vital signs:  Vitals:   02/13/21 0400 02/13/21 0429 02/13/21 0500 02/13/21 0600  BP: 111/61  (!) 96/53 119/71  Pulse: 63  64 68  Resp: 20  20 (!) 25  Temp:  97.6 F (36.4 C)    TempSrc:  Axillary    SpO2: 96%  95% 93%  Weight:    63.7 kg  Height:        Last BM Date: 02/12/21  Intake/Output   Yesterday:  12/09 0701 - 12/10 0700 In: 1412.4 [Blood:1412.4] Out: 1610 [RUEAV:4098; Drains:85] This shift:  No intake/output data recorded.  Bowel function:  Flatus: YES  BM:  YES  Drain: Serosanguinous -primarily old clot.  Stripped.   Physical Exam:  General: Pt awake/alert in no acute distress Eyes: PERRL, normal EOM.  Sclera clear.  No icterus Neuro: CN II-XII intact w/o focal sensory/motor deficits. Lymph: No head/neck/groin lymphadenopathy Psych:  No delerium/psychosis/paranoia.  Oriented x 4 HENT: Normocephalic, Mucus membranes moist.  No thrush Neck: Supple, No tracheal deviation.  No obvious thyromegaly Chest: No pain to chest wall compression.  Good respiratory excursion.  No audible wheezing CV:  Pulses intact.  Regular rhythm.  No major extremity edema MS: Normal AROM mjr joints.  No obvious deformity  Abdomen: Soft.  Moderately distended.  Tenderness at Pfannenstiel incision and lower abdomen.  Upper abdomen not tender.  Drain output SS.  No evidence of peritonitis.  No incarcerated  hernias.  Ext:   No deformity.  Mild edema.  No cyanosis Skin: No petechiae / purpurea.  No major sores.  Warm and dry  GU- foley with clear yellow urine    Results:   Cultures: Recent Results (from the past 720 hour(s))  SARS Coronavirus 2 (TAT 6-24 hrs)     Status: None   Collection Time: 02/08/21 12:00 AM  Result Value Ref Range Status   SARS Coronavirus 2 RESULT: NEGATIVE  Final    Comment: RESULT: NEGATIVESARS-CoV-2 INTERPRETATION:A NEGATIVE  test result means that SARS-CoV-2 RNA was not present in the specimen above the limit of detection of this test. This does not preclude a possible SARS-CoV-2 infection and should not be used as the  sole basis for patient management decisions. Negative results must be combined with clinical observations, patient history, and epidemiological information. Optimum specimen types and timing for peak viral levels during infections caused by SARS-CoV-2  have not been determined. Collection of multiple specimens or types of specimens may be necessary to detect virus. Improper specimen collection and handling, sequence variability under primers/probes, or organism present below the limit of detection may  lead to false negative results. Positive and negative predictive values of testing are highly dependent on prevalence.  False negative test results are more likely when prevalence of disease is high.The expected result is NEGATIVE.Fact S heet for  Healthcare Providers: LocalChronicle.no Sheet for Patients: SalonLookup.es Reference Range - Negative   MRSA Next Gen by PCR, Nasal     Status: None   Collection Time: 02/10/21  6:41 PM   Specimen: Nasal Mucosa; Nasal Swab  Result Value Ref Range Status   MRSA by PCR Next Gen NOT DETECTED NOT DETECTED Final    Comment: (NOTE) The GeneXpert MRSA Assay (FDA approved for NASAL specimens only), is one component of a comprehensive MRSA colonization  surveillance program. It is not intended to diagnose MRSA infection nor to guide or monitor treatment for MRSA infections. Test performance is not FDA approved in patients less than 33 years old. Performed at [facility], [place] [address] [place], [place] [address]     Labs: Results for orders placed or performed during the hospital encounter of 02/10/21 (from the past 48 hour(s))  Hemoglobin and hematocrit, blood     Status: Abnormal   Collection Time: 02/11/21  9:00 AM  Result Value Ref Range   Hemoglobin 8.4 (L) 12.0 - 15.0 g/dL   HCT 25.3 (L) 36.0 - 46.0 %    Comment: Performed at [facility], [place] [address], [place], [place] [address]  Hemoglobin     Status: Abnormal   Collection Time: 02/12/21  2:45 AM  Result Value Ref Range   Hemoglobin 6.0 (LL) 12.0 - 15.0 g/dL    Comment: REPEATED TO VERIFY THIS CRITICAL RESULT HAS VERIFIED AND BEEN CALLED TO RN A CHAVEZ BY [facility] ON 12 09 2022 AT 0346, AND HAS BEEN READ BACK.  Performed at [facility], [place] [address], [place], [place] [address]   Potassium     Status: None   Collection Time: 02/12/21  2:45 AM  Result Value Ref Range   Potassium 4.4 3.5 - 5.1 mmol/L    Comment: Performed at [facility], [place] [address] [place], [place] [address]  Creatinine, serum     Status: Abnormal   Collection Time: 02/12/21  2:45 AM  Result Value Ref Range   Creatinine, Ser 1.78 (H) 0.44 - 1.00 mg/dL   GFR, Estimated 31 (L) >60 mL/min    Comment: (NOTE) Calculated using the CKD-EPI Creatinine Equation (2021) Performed at [facility], [place] [address], [place], [place] [address]   Prepare RBC (crossmatch)     Status: None   Collection Time: 02/12/21  4:20 AM  Result Value Ref Range   Order Confirmation      ORDER PROCESSED BY BLOOD BANK Performed at [place] [address] [place], [place] [address]    Prepare RBC (crossmatch)     Status: None   Collection Time: 02/12/21  7:23 AM  Result Value Ref Range   Order Confirmation      ORDER PROCESSED BY BLOOD BANK Performed at [place] [address], [place], [place] [address]   Hemoglobin and hematocrit, blood     Status: Abnormal   Collection Time: 02/12/21  8:21 PM  Result Value Ref Range   Hemoglobin 10.8 (L) 12.0 - 15.0 g/dL    Comment: REPEATED TO VERIFY POST TRANSFUSION SPECIMEN    HCT 32.4 (L) 36.0 - 46.0 %    Comment: Performed at [facility], [place] [address] [place], [place] [address]  Hemoglobin     Status: Abnormal  Collection Time: 02/13/21  3:05 AM  Result Value Ref Range   Hemoglobin 10.1 (L) 12.0 - 15.0 g/dL    Comment: Performed at [facility], [place] [address], [place], [place] [address]    Imaging / Studies: No results found.  Medications / Allergies: per chart  Antibiotics: Anti-infectives (From admission, onward)    Start     Dose/Rate Route Frequency Ordered Stop   02/10/21 2200  cefoTEtan (CEFOTAN) 2 g in sodium chloride 0.9 % 100 mL IVPB        2 g 200 mL/hr over 30 Minutes Intravenous Every 12 hours 02/10/21 1839 02/10/21 2212   02/10/21 1400  neomycin (MYCIFRADIN) tablet 1,000 mg  Status:  Discontinued       See Hyperspace for full Linked Orders Report.   1,000 mg Oral 3 times per day 02/10/21 1112 02/10/21 1120   02/10/21 1400  metroNIDAZOLE (FLAGYL) tablet 1,000 mg  Status:  Discontinued       See Hyperspace for full Linked Orders Report.   1,000 mg Oral 3 times per day 02/10/21 1112 02/10/21 1120   02/10/21 1115  cefoTEtan (CEFOTAN) 2 g in sodium chloride 0.9 % 100 mL IVPB        2 g 200 mL/hr over 30 Minutes Intravenous On call to O.R. 02/10/21 1112 02/10/21 1325         Note: Portions of this report may have been transcribed using voice recognition software. Every effort was made to ensure accuracy; however, inadvertent  computerized transcription errors may be present.   Any transcriptional errors that result from this process are unintentional.    [facility], [place] [address] [place], [place] [address] [phone] Fax [phone] Main  CONTACT INFORMATION:  Weekday (9AM-5PM): Call [facility] main office at [phone]  Weeknight (5PM-9AM) or Weekend/Holiday: Check [email or web address] (password " [email or web address]") for General Surgery [facility] coverage  (Please, do not use SecureChat as it is not reliable communication to operating surgeons for immediate patient care)      02/13/2021  7:12 AM

## 2021-02-13 NOTE — Progress Notes (Signed)
NAME:  Tara [name], MRN:  [id number], DOB:  [birth year], LOS: 3 ADMISSION DATE:  02/10/2021, CONSULTATION DATE:  02/12/2021 REFERRING MD:  Dr. Johney Maine, CHIEF COMPLAINT:  COPD   History of Present Illness:  Tara [name] is a 70 y.o. female active smoker  with a past medical history significant for COPD, daily tobacco use, CHF, arthritis, sleep apnea, hypothyroidism, and prediabetes presented for elective robotic colon resection and ostomy establishment for colovesicular fistula with Dr. Johney Maine 12/7.  Per chart review patient was diagnosed with abscess along the sigmoid: Suspicious for diverticulitis versus colitis which was treated with 1 month IV antibiotics followed by 6 weeks oral antibiotics.  CT abdomen was obtained and revealed gas in the bladder suspicious for colovesicular fistula.  Postoperatively patient was seen with symptomatic anemia most likely oozing anastomosis site.  Critical care consulted for assistance in medical management.  Pertinent  Medical History  COPD, CHF, sleep apnea, hypothyroidism, prediabetes, arthritis, CHF  Significant Hospital Events: Including procedures, antibiotic start and stop dates in addition to other pertinent events   12/7 elective colon resection with colostomy formation per Dr. Johney Maine 12/9 critical care consulted for medical management   Scheduled Meds:  acetaminophen  1,000 mg Oral Q6H   albuterol  2.5 mg Nebulization QID   alvimopan  12 mg Oral BID   aspirin EC  81 mg Oral QHS   chlorhexidine  15 mL Mouth Rinse BID   Chlorhexidine Gluconate Cloth  6 each Topical Daily   cholecalciferol  800 Units Oral Daily   diltiazem  120 mg Oral Daily   feeding supplement  237 mL Oral BID BM   gabapentin  100 mg Oral TID   guaiFENesin  1,200 mg Oral BID   levothyroxine  50 mcg Oral QAC breakfast   lip balm  1 application Topical BID   LORazepam  0.5 mg Oral BID   mouth rinse  15 mL Mouth Rinse q12n4p   omega-3 acid ethyl esters  1,000 mg Oral Daily    pantoprazole  40 mg Oral q AM   polycarbophil  625 mg Oral BID   potassium chloride SA  20 mEq Oral QHS   sodium chloride flush  3 mL Intravenous Q12H   tiZANidine  4 mg Oral QHS   umeclidinium bromide  1 puff Inhalation Daily   Continuous Infusions:  sodium chloride     albumin human Stopped (02/12/21 0252)   methocarbamol (ROBAXIN) IV     PRN Meds:.sodium chloride, albumin human, albuterol, alum & mag hydroxide-simeth, diphenhydrAMINE **OR** diphenhydrAMINE, enalaprilat, HYDROmorphone (DILAUDID) injection, magic mouthwash, melatonin, methocarbamol (ROBAXIN) IV, methocarbamol, metoprolol tartrate, ondansetron **OR** ondansetron (ZOFRAN) IV, prochlorperazine **OR** prochlorperazine, simethicone, sodium chloride flush, traMADol    Interim History / Subjective:  Pain tolerable but worse when coughs and quite congested sounding / mucus not purulent  Tol diet ok   Objective   Blood pressure 120/66, pulse (!) 58, temperature 97.6 F (36.4 C), temperature source Axillary, resp. rate 20, height 5\' 1"  (1.549 m), weight 63.7 kg, SpO2 95 %.        Intake/Output Summary (Last 24 hours) at 02/13/2021 0814 Last data filed at 02/13/2021 0600 Gross per 24 hour  Intake 1412.41 ml  Output 5560 ml  Net -4147.59 ml   Filed Weights   02/11/21 0500 02/12/21 0500 02/13/21 0600  Weight: 63.6 kg 63 kg 63.7 kg    Examination: Tmax 99 General appearance:    elderly wf / rattling cough   At Rest  02 sats  94% on 2lpm   No jvd Oropharynx clear,  mucosa nl Neck supple Lungs withdistant  exp > insp rhonchi bilaterally RRR no s3 or or sign murmur Abd soft/ limited excursion  Extr warm with no edema or clubbing noted Neuro  Sensorium intact,  no apparent motor deficits     Resolved Hospital Problem list     Assessment & Plan:  Colovesicular fistula Partial colon obstruction -Underwent robotic rectosigmoid resection with takedown of colovesicular fistula with Dr. Johney Maine 12/7.   Intra-abdominal abscess also drained Path  c/w acute diverticulitis with perf P: Primary management per general surgery Supportive care Advancement of diet per general surgery Continue empiric antibiotics Pain control  COPD  GOLD 2 but 02 dep / active smoker  -06/03/19 - FVC 1.96 (71%), FEV1 1.15 (55%), ratio 59, TLC 138% Moderately severe obstructive airway disease with air trapping   P: Aggressive pulmonary hygiene/ added flutter valve 12/10 Mobilize as able Aspiration precautions Bronchodilators > change incruse to duoneb given cough issues    >>> check pcxr am 09/98   Diastolic congestive heart failure -Echocardiogram April 2022 revealed EF of 65 to 70% with grade 1 diastolic dysfunction.  No regional wall motion abnormality Atrial fibrillation, intermittent  -Anticoagulated at baseline with Eliquis - in SR with pac's am 12/10  P: Continuous telemetry  Continue home aspirin and home monitor telemetry exam  Heart health diet with sodium restriction when able  Strict intake and output  Daily weight to assess volume status Daily assessment for need to diurese with the use of IV lasix   Closely monitor renal function and electrolytes  Provide supplemtal oxygen as needed to maintain oxygen saturations above 90%   Acute blood loss anemia superimposed on anemia of chronic disease  - s/p transfusion by CSS pm 12/10 Lab Results  Component Value Date   HGB 10.1 (L) 02/13/2021   HGB 10.8 (L) 02/12/2021   HGB 6.0 (LL) 02/12/2021   - approp response, no active bleeding     CKD stage IIIb -Creatinine fluctuates between 1.5-1.8 with GFR ranging in the mid 30s Lab Results  Component Value Date   CREATININE 1.78 (H) 02/12/2021   CREATININE 1.67 (H) 02/11/2021   CREATININE 1.82 (H) 02/04/2021  Renal function at baseline Rec Follow renal function  Monitor urine output Trend Bmet Avoid nephrotoxins Ensure adequate renal perfusion    Best Practice (right click and  "Reselect all SmartList Selections" daily)   Diet/type: dysphagia diet (see orders) DVT prophylaxis: SCD GI prophylaxis: PPI Lines: N/A Foley:  N/A Code Status:  full code Last date of multidisciplinary goals of care discussion Per primary   Labs   CBC: Recent Labs  Lab 02/11/21 0251 02/11/21 0900 02/12/21 0245 02/12/21 2021 02/13/21 0305  WBC 7.9  --   --   --   --   HGB 6.9* 8.4* 6.0* 10.8* 10.1*  HCT 21.4* 25.3*  --  32.4*  --   MCV 101.9*  --   --   --   --   PLT 137*  --   --   --   --     Basic Metabolic Panel: Recent Labs  Lab 02/11/21 0251 02/12/21 0245  NA 136  --   K 4.1 4.4  CL 99  --   CO2 29  --   GLUCOSE 136*  --   BUN 21  --   CREATININE 1.67* 1.78*  CALCIUM 8.7*  --   MG 1.4*  --  GFR: Estimated Creatinine Clearance: 25.5 mL/min (A) (by C-G formula based on SCr of 1.78 mg/dL (H)). Recent Labs  Lab 02/11/21 0251  WBC 7.9    Liver Function Tests: No results for input(s): AST, ALT, ALKPHOS, BILITOT, PROT, ALBUMIN in the last 168 hours. No results for input(s): LIPASE, AMYLASE in the last 168 hours. No results for input(s): AMMONIA in the last 168 hours.  ABG    Component Value Date/Time   PHART 7.356 07/04/2020 0501   PCO2ART 43.9 07/04/2020 0501   PO2ART 81.0 (L) 07/04/2020 0501   HCO3 23.5 07/04/2020 0501   TCO2 32 07/15/2020 1800   ACIDBASEDEF 0.9 07/04/2020 0501   O2SAT 96.3 07/04/2020 0501     Coagulation Profile: No results for input(s): INR, PROTIME in the last 168 hours.  Cardiac Enzymes: No results for input(s): CKTOTAL, CKMB, CKMBINDEX, TROPONINI in the last 168 hours.  HbA1C: Hgb A1c MFr Bld  Date/Time Value Ref Range Status  02/04/2021 02:46 PM 5.8 (H) 4.8 - 5.6 % Final    Comment:    (NOTE) Pre diabetes:          5.7%-6.4%  Diabetes:              >6.4%  Glycemic control for   <7.0% adults with diabetes   07/06/2020 06:32 AM 5.4 4.8 - 5.6 % Final    Comment:    (NOTE) Pre diabetes:           5.7%-6.4%  Diabetes:              >6.4%  Glycemic control for   <7.0% adults with diabetes     CBG: Recent Labs  Lab 02/10/21 1120 02/10/21 2019  GLUCAP 84 130*      The patient is critically ill with multiple organ systems failure and requires high complexity decision making for assessment and support, frequent evaluation and titration of therapies, application of advanced monitoring technologies and extensive interpretation of multiple databases. Critical Care Time devoted to patient care services described in this note is 35 minutes.   Christinia Gully, MD Pulmonary and [place] [phone]   After 7:00 pm call Elink  [phone]

## 2021-02-14 ENCOUNTER — Inpatient Hospital Stay (HOSPITAL_COMMUNITY): Payer: Medicare HMO

## 2021-02-14 DIAGNOSIS — J449 Chronic obstructive pulmonary disease, unspecified: Secondary | ICD-10-CM | POA: Diagnosis not present

## 2021-02-14 DIAGNOSIS — N321 Vesicointestinal fistula: Secondary | ICD-10-CM | POA: Diagnosis not present

## 2021-02-14 DIAGNOSIS — J9611 Chronic respiratory failure with hypoxia: Secondary | ICD-10-CM | POA: Diagnosis not present

## 2021-02-14 DIAGNOSIS — D62 Acute posthemorrhagic anemia: Secondary | ICD-10-CM | POA: Diagnosis not present

## 2021-02-14 LAB — BASIC METABOLIC PANEL
Anion gap: 6 (ref 5–15)
BUN: 35 mg/dL — ABNORMAL HIGH (ref 8–23)
CO2: 32 mmol/L (ref 22–32)
Calcium: 8.8 mg/dL — ABNORMAL LOW (ref 8.9–10.3)
Chloride: 99 mmol/L (ref 98–111)
Creatinine, Ser: 1.4 mg/dL — ABNORMAL HIGH (ref 0.44–1.00)
GFR, Estimated: 41 mL/min — ABNORMAL LOW (ref >60)
Glucose, Bld: 99 mg/dL (ref 70–99)
Potassium: 4.4 mmol/L (ref 3.5–5.1)
Sodium: 137 mmol/L (ref 135–145)

## 2021-02-14 LAB — CBC
HCT: 31.4 % — ABNORMAL LOW (ref 36.0–46.0)
Hemoglobin: 10.4 g/dL — ABNORMAL LOW (ref 12.0–15.0)
MCH: 31.7 pg (ref 26.0–34.0)
MCHC: 33.1 g/dL (ref 30.0–36.0)
MCV: 95.7 fL (ref 80.0–100.0)
Platelets: 106 10*3/uL — ABNORMAL LOW (ref 150–400)
RBC: 3.28 MIL/uL — ABNORMAL LOW (ref 3.87–5.11)
RDW: 15.9 % — ABNORMAL HIGH (ref 11.5–15.5)
WBC: 5.7 10*3/uL (ref 4.0–10.5)
nRBC: 0 % (ref 0.0–0.2)

## 2021-02-14 LAB — MAGNESIUM: Magnesium: 1.6 mg/dL — ABNORMAL LOW (ref 1.7–2.4)

## 2021-02-14 MED ORDER — ENOXAPARIN SODIUM 40 MG/0.4ML IJ SOSY
40.0000 mg | PREFILLED_SYRINGE | INTRAMUSCULAR | Status: DC
  Administered 2021-02-14 – 2021-02-15 (×2): 40 mg via SUBCUTANEOUS
  Filled 2021-02-14 (×2): qty 0.4

## 2021-02-14 MED ORDER — IPRATROPIUM-ALBUTEROL 0.5-2.5 (3) MG/3ML IN SOLN
3.0000 mL | Freq: Three times a day (TID) | RESPIRATORY_TRACT | Status: DC
  Administered 2021-02-14 – 2021-02-15 (×3): 3 mL via RESPIRATORY_TRACT
  Filled 2021-02-14 (×3): qty 3

## 2021-02-14 MED ORDER — MAGNESIUM SULFATE 4 GM/100ML IV SOLN
4.0000 g | Freq: Once | INTRAVENOUS | Status: AC
  Administered 2021-02-14: 4 g via INTRAVENOUS
  Filled 2021-02-14: qty 100

## 2021-02-14 NOTE — Progress Notes (Signed)
Tara [name] [id number] [birth year]  CARE TEAM:  PCP: Neale Burly, MD  Outpatient Care Team: Patient Care Team: Neale Burly, MD as PCP - General (Internal Medicine) Michael Boston, MD as Consulting Physician (General Surgery) Rogene Houston, MD as Consulting Physician (Gastroenterology) Chesley Mires, MD as Consulting Physician (Pulmonary Disease) Assar, Jodelle Gross, DO as Referring Physician (Cardiology)  Inpatient Treatment Team: Treatment Team: Attending Provider: Michael Boston, MD; Consulting Physician: Pccm, Md, MD; Nurse Practitioner: Rico Ala, NP; Nurse Practitioner: Minor, Ferrel Logan., RN; Rounding Team: Pccm, Md, MD; Charge Nurse: Kathy Breach, RN; Registered Nurse: Benny Lennert, RN (Inactive); Technician: Kipp Laurence, NT; Technician: Ward, Imagene Riches, [place]; Registered Nurse: Claud Kelp, RN; Technician: Antoine Primas; Technician: Regis Bill, NT   Problem List:   Principal Problem:   Colovesical fistula Active Problems:   Chronic respiratory failure with hypoxia (HCC)   Cigarette smoker   CKD (chronic kidney disease), stage III (HCC)   Moderate COPD (chronic obstructive pulmonary disease) (HCC)   Acute blood loss anemia (ABLA)   4 Days Post-Op  02/10/2021  POST-OPERATIVE DIAGNOSIS:   COLOVESICAL FISTULA PARTIAL COLON OBSTRUCTION   PROCEDURE:   ROBOTIC LOW ANTERIOR RECTOSIGMOID RESECTION  TAKEDOWN OF COLOVESICAL FISTULA DRAINAGE OF INTRAABDOMINAL ABSCESS TRANSVERSUS ABDOMINIS PLANE (TAP) BLOCK - BILATERAL INTRAOPERATIVE ASSESSMENT OF PERFUSION USING FIREFLY IMMUNOFLUORESCENCE RIGID PROCTOSCOPY   SURGEON:  Adin Hector, MD  OR FINDINGS:    Patient had very inflamed and torturous rectosigmoid colon with very dense adhesions to the left lateral pelvis and left dome of the bladder close to left ureteropelvic junction.  Small contained abscess in the region drained.   No obvious metastatic disease on visceral parietal  peritoneum or liver.   The 31 EEA anastomosis rests 10 cm from the anal verge by rigid proctoscopy.   CASE DATA:   Type of patient?: Elective [facility] Private Case   Status of Case? Elective Scheduled   Infection Present At Time Of Surgery (PATOS)?  ABSCESS  Assessment  Progressing well  [facility] Stay = 4 days)  Plan:  -Transfused for acute blood loss anemia   -VTE prophylaxis- SCDs.  Start prophylactic lovenox  -transfer to floor  Appreciate medicine/pulmonary assistance  -continues to have bowel movements, improving distention, hungry. Stop entereg.  Try soft diet  -Follow-up on pathology.  Hopefully just diverticulitis  -CR improved, good uop, will DC foley  -mobilize as tolerated to help recovery.    Hypothyroidism.  Levothyroxine.  Chronic COPD.  Wean oxygen down.  Inhalers as needed.  Consider diuresis if hemoglobin more stable.  Keep on the dry side and use colloid for boluses as needed.  Disposition:  Disposition:  The patient is from: Home  Anticipate discharge to:  Home with Home Health  Anticipated Date of Discharge is:  December 11,2022    Barriers to discharge:  Pending Clinical improvement (more likely than not)  Patient currently is NOT MEDICALLY STABLE for discharge from the hospital from a surgery standpoint.      25 minutes spent in review, evaluation, examination, counseling, and coordination of care.   I have reviewed this patient's available data, including medical history, events of note, physical examination and test results as part of my evaluation.  A significant portion of that time was spent in counseling.  Care during the described time interval was provided by me.  02/14/2021    Subjective: (Chief complaint)  Feels hungry, having bowel movements, improved distention  Objective:  Vital signs:  Vitals:   02/14/21 0400 02/14/21 0412 02/14/21 0500 02/14/21 0600  BP: 134/68  (!) 149/77 132/75  Pulse: 64  71 70  Resp:  (!) 21  17 17   Temp:  98.3 F (36.8 C)    TempSrc:  Axillary    SpO2: 94%  93% 95%  Weight:   63.8 kg   Height:        Last BM Date: 02/13/21  Intake/Output   Yesterday:  12/10 0701 - 12/11 0700 In: 723 [P.O.:720; I.V.:3] Out: 3283 [Urine:3200; Drains:83] This shift:  No intake/output data recorded.  Bowel function:  Flatus: YES  BM:  YES  Drain: Serosanguinous -   Physical Exam:  General: Pt awake/alert in no acute distress Eyes: PERRL, normal EOM.  Sclera clear.  No icterus Neuro: CN II-XII intact w/o focal sensory/motor deficits. Lymph: No head/neck/groin lymphadenopathy Psych:  No delerium/psychosis/paranoia.  Oriented x 4 HENT: Normocephalic, Mucus membranes moist.  No thrush Neck: Supple, No tracheal deviation.  No obvious thyromegaly Chest: No pain to chest wall compression.  Good respiratory excursion.  No audible wheezing CV:  Pulses intact.  Regular rhythm.  No major extremity edema MS: Normal AROM mjr joints.  No obvious deformity  Abdomen: Soft.  Mildly distended.  Tenderness at Pfannenstiel incision and lower abdomen.  Upper abdomen not tender.  Drain output SS.  No evidence of peritonitis.  No incarcerated hernias.  Ext:   No deformity.  Mild edema.  No cyanosis Skin: No petechiae / purpurea.  No major sores.  Warm and dry  GU- foley with clear yellow urine    Results:   Cultures: Recent Results (from the past 720 hour(s))  SARS Coronavirus 2 (TAT 6-24 hrs)     Status: None   Collection Time: 02/08/21 12:00 AM  Result Value Ref Range Status   SARS Coronavirus 2 RESULT: NEGATIVE  Final    Comment: RESULT: NEGATIVESARS-CoV-2 INTERPRETATION:A NEGATIVE  test result means that SARS-CoV-2 RNA was not present in the specimen above the limit of detection of this test. This does not preclude a possible SARS-CoV-2 infection and should not be used as the  sole basis for patient management decisions. Negative results must be combined with clinical  observations, patient history, and epidemiological information. Optimum specimen types and timing for peak viral levels during infections caused by SARS-CoV-2  have not been determined. Collection of multiple specimens or types of specimens may be necessary to detect virus. Improper specimen collection and handling, sequence variability under primers/probes, or organism present below the limit of detection may  lead to false negative results. Positive and negative predictive values of testing are highly dependent on prevalence. False negative test results are more likely when prevalence of disease is high.The expected result is NEGATIVE.Fact S heet for  Healthcare Providers: LocalChronicle.no Sheet for Patients: SalonLookup.es Reference Range - Negative   MRSA Next Gen by PCR, Nasal     Status: None   Collection Time: 02/10/21  6:41 PM   Specimen: Nasal Mucosa; Nasal Swab  Result Value Ref Range Status   MRSA by PCR Next Gen NOT DETECTED NOT DETECTED Final    Comment: (NOTE) The GeneXpert MRSA Assay (FDA approved for NASAL specimens only), is one component of a comprehensive MRSA colonization surveillance program. It is not intended to diagnose MRSA infection nor to guide or monitor treatment for MRSA infections. Test performance is not FDA approved in patients less than 34 years old. Performed at [facility], [place]  [address], [place], [place] [address]     Labs: Results for orders placed or performed during the hospital encounter of 02/10/21 (from the past 48 hour(s))  Hemoglobin and hematocrit, blood     Status: Abnormal   Collection Time: 02/12/21  8:21 PM  Result Value Ref Range   Hemoglobin 10.8 (L) 12.0 - 15.0 g/dL    Comment: REPEATED TO VERIFY POST TRANSFUSION SPECIMEN    HCT 32.4 (L) 36.0 - 46.0 %    Comment: Performed at [facility], [place] [address] [place], [place] [address]   Hemoglobin     Status: Abnormal   Collection Time: 02/13/21  3:05 AM  Result Value Ref Range   Hemoglobin 10.1 (L) 12.0 - 15.0 g/dL    Comment: Performed at [facility], [place] [address], [place], [place] [address]  CBC     Status: Abnormal   Collection Time: 02/14/21  3:12 AM  Result Value Ref Range   WBC 5.7 4.0 - 10.5 K/uL   RBC 3.28 (L) 3.87 - 5.11 MIL/uL   Hemoglobin 10.4 (L) 12.0 - 15.0 g/dL   HCT 31.4 (L) 36.0 - 46.0 %   MCV 95.7 80.0 - 100.0 fL   MCH 31.7 26.0 - 34.0 pg   MCHC 33.1 30.0 - 36.0 g/dL   RDW 15.9 (H) 11.5 - 15.5 %   Platelets 106 (L) 150 - 400 K/uL    Comment: SPECIMEN CHECKED FOR CLOTS Immature Platelet Fraction may be clinically indicated, consider ordering this additional test [id number] REPEATED TO VERIFY PLATELET COUNT CONFIRMED BY SMEAR    nRBC 0.0 0.0 - 0.2 %    Comment: Performed at [facility], [place] [address] [place], [place] [address]  Basic metabolic panel     Status: Abnormal   Collection Time: 02/14/21  3:12 AM  Result Value Ref Range   Sodium 137 135 - 145 mmol/L   Potassium 4.4 3.5 - 5.1 mmol/L   Chloride 99 98 - 111 mmol/L   CO2 32 22 - 32 mmol/L   Glucose, Bld 99 70 - 99 mg/dL    Comment: Glucose reference range applies only to samples taken after fasting for at least 8 hours.   BUN 35 (H) 8 - 23 mg/dL   Creatinine, Ser 1.40 (H) 0.44 - 1.00 mg/dL   Calcium 8.8 (L) 8.9 - 10.3 mg/dL   GFR, Estimated 41 (L) >60 mL/min    Comment: (NOTE) Calculated using the CKD-EPI Creatinine Equation (2021)    Anion gap 6 5 - 15    Comment: Performed at [facility], [place] [address], [place], [place] [address]  Magnesium     Status: Abnormal   Collection Time: 02/14/21  3:12 AM  Result Value Ref Range   Magnesium 1.6 (L) 1.7 - 2.4 mg/dL    Comment: Performed at [facility], [place] [address], [place], [place] [address]    Imaging / Studies: No results found.  Medications  / Allergies: per chart  Antibiotics: Anti-infectives (From admission, onward)    Start     Dose/Rate Route Frequency Ordered Stop   02/10/21 2200  cefoTEtan (CEFOTAN) 2 g in sodium chloride 0.9 % 100 mL IVPB        2 g 200 mL/hr over 30 Minutes Intravenous Every 12 hours 02/10/21 1839 02/10/21 2212   02/10/21 1400  neomycin (MYCIFRADIN) tablet 1,000 mg  Status:  Discontinued       See Hyperspace for full  Linked Orders Report.   1,000 mg Oral 3 times per day 02/10/21 1112 02/10/21 1120   02/10/21 1400  metroNIDAZOLE (FLAGYL) tablet 1,000 mg  Status:  Discontinued       See Hyperspace for full Linked Orders Report.   1,000 mg Oral 3 times per day 02/10/21 1112 02/10/21 1120   02/10/21 1115  cefoTEtan (CEFOTAN) 2 g in sodium chloride 0.9 % 100 mL IVPB        2 g 200 mL/hr over 30 Minutes Intravenous On call to O.R. 02/10/21 1112 02/10/21 1325         Note: Portions of this report may have been transcribed using voice recognition software. Every effort was made to ensure accuracy; however, inadvertent computerized transcription errors may be present.   Any transcriptional errors that result from this process are unintentional.    [facility], [place] [address], [place], [place] [address] [phone] Fax [phone] Main  CONTACT INFORMATION:  Weekday (9AM-5PM): Call [facility] main office at [phone]  Weeknight (5PM-9AM) or Weekend/Holiday: Check [email or web address] (password " [email or web address]") for General Surgery [facility] coverage  (Please, do not use SecureChat as it is not reliable communication to operating surgeons for immediate patient care)      02/14/2021  7:31 AM

## 2021-02-14 NOTE — Progress Notes (Signed)
[facility] ADULT ICU REPLACEMENT PROTOCOL   The patient does apply for the [facility] Adult ICU Electrolyte Replacment Protocol based on the criteria listed below:   1.Exclusion criteria: TCTS patients, ECMO patients, and Dialysis patients 2. Is GFR >/= 30 ml/min? Yes.    Patient's GFR today is 41 3. Is SCr </= 2? Yes.   Patient's SCr is 1.4 mg/dL 4. Did SCr increase >/= 0.5 in 24 hours? No. 5.Pt's weight >40kg  Yes.   6. Abnormal electrolyte(s): mag 1.6  7. Electrolytes replaced per protocol 8.  Call MD STAT for K+ </= 2.5, Phos </= 1, or Mag </= 1 Physician:    Ronda Fairly A 02/14/2021 5:25 AM

## 2021-02-14 NOTE — Progress Notes (Signed)
NAME:  Tara [name], MRN:  [id number], DOB:  January 24, 1951, LOS: 4 ADMISSION DATE:  02/10/2021, CONSULTATION DATE:  02/12/2021 REFERRING MD:  Dr. Johney Maine, CHIEF COMPLAINT:  COPD   History of Present Illness:  Tara [name] is a 70 y.o. female active smoker  with a past medical history significant for COPD, daily tobacco use, CHF, arthritis, sleep apnea, hypothyroidism, and prediabetes presented for elective robotic colon resection and ostomy establishment for colovesicular fistula with Dr. Johney Maine 12/7.  Per chart review patient was diagnosed with abscess along the sigmoid: Suspicious for diverticulitis versus colitis which was treated with 1 month IV antibiotics followed by 6 weeks oral antibiotics.  CT abdomen was obtained and revealed gas in the bladder suspicious for colovesicular fistula.  Postoperatively patient was seen with symptomatic anemia most likely oozing anastomosis site.  Critical care consulted for assistance in medical management.  Pertinent  Medical History  COPD, CHF, sleep apnea, hypothyroidism, prediabetes, arthritis, CHF  Significant Hospital Events: Including procedures, antibiotic start and stop dates in addition to other pertinent events   12/7 elective colon resection with colostomy formation per Dr. Johney Maine 12/9 critical care consulted for medical management 12/10 added flutter valve due to poor cough mechanics   Scheduled Meds:  acetaminophen  1,000 mg Oral Q6H   aspirin EC  81 mg Oral QHS   chlorhexidine  15 mL Mouth Rinse BID   Chlorhexidine Gluconate Cloth  6 each Topical Daily   cholecalciferol  800 Units Oral Daily   diltiazem  120 mg Oral Daily   enoxaparin (LOVENOX) injection  40 mg Subcutaneous Q24H   feeding supplement  237 mL Oral BID BM   gabapentin  100 mg Oral TID   guaiFENesin  1,200 mg Oral BID   ipratropium-albuterol  3 mL Nebulization QID   levothyroxine  50 mcg Oral QAC breakfast   lip balm  1 application Topical BID   LORazepam  0.5 mg Oral  BID   mouth rinse  15 mL Mouth Rinse q12n4p   pantoprazole  40 mg Oral q AM   polycarbophil  625 mg Oral BID   potassium chloride SA  20 mEq Oral QHS   sodium chloride flush  3 mL Intravenous Q12H   tiZANidine  4 mg Oral QHS   Continuous Infusions:  sodium chloride     methocarbamol (ROBAXIN) IV     PRN Meds:.sodium chloride, albuterol, alum & mag hydroxide-simeth, diphenhydrAMINE **OR** diphenhydrAMINE, enalaprilat, HYDROmorphone (DILAUDID) injection, magic mouthwash, melatonin, methocarbamol (ROBAXIN) IV, methocarbamol, metoprolol tartrate, ondansetron **OR** ondansetron (ZOFRAN) IV, prochlorperazine **OR** prochlorperazine, simethicone, sodium chloride flush, traMADol    Interim History / Subjective:  Good spirits, still puny cough mechanics but less rattling    Objective   Blood pressure (!) 144/76, pulse 66, temperature 98.6 F (37 C), temperature source Oral, resp. rate 18, height 5\' 1"  (1.549 m), weight 63.8 kg, SpO2 98 %.        Intake/Output Summary (Last 24 hours) at 02/14/2021 0954 Last data filed at 02/14/2021 0839 Gross per 24 hour  Intake 822.14 ml  Output 3533 ml  Net -2710.86 ml   Filed Weights   02/12/21 0500 02/13/21 0600 02/14/21 0500  Weight: 63 kg 63.7 kg 63.8 kg    Examination: Tmax  98.6  - no abx since 12/7 General appearance:  elderly wf at 45 degrees nad   At Rest 02 sats  98% on 2lpm   No jvd Oropharynx clear,  mucosa nl Neck supple Lungs with distant  rhonchi bilaterally RRR no s3 or or sign murmur Abd soft, pos hoover's sign toward the end of insp   Extr warm with no edema or clubbing noted Neuro  Sensorium intact,  no apparent motor deficits     I personally reviewed images and agree with radiology impression as follows:  CXR:   portable am 12/11 Increased bibasilar opacities are noted concerning for atelectasis with probable small left pleural effusion   Resolved Hospital Problem list     Assessment & Plan:  Colovesicular  fistula Partial colon obstruction -Underwent robotic rectosigmoid resection with takedown of colovesicular fistula with Dr. Johney Maine 12/7.  Intra-abdominal abscess also drained Path  c/w acute diverticulitis with perf P: Primary management per general surgery Supportive care Advancement of diet per general surgery Pain control  COPD  GOLD 2 but 02 dep / active smoker  -06/03/19 - FVC 1.96 (71%), FEV1 1.15 (55%), ratio 59, TLC 138% Moderately severe obstructive airway disease with air trapping  Worse aeration bases am 12/11 with small effusion on L likely "sympathetic" but wbc nl, no fever or purulent effusions.   P: Aggressive pulmonary hygiene/ added flutter valve 12/10 Mobilize as able to chair  Aspiration precautions Bronchodilators > changed incruse to duoneb given cough issues        Diastolic congestive heart failure -Echocardiogram April 2022 revealed EF of 65 to 70% with grade 1 diastolic dysfunction.  No regional wall motion abnormality Atrial fibrillation, intermittent  -Anticoagulated at baseline with Eliquis - in SR with pac's am 12/10  P: Continuous telemetry  Continue home aspirin and home monitor telemetry exam  Heart health diet with sodium restriction when able  Strict intake and output  Daily weight to assess volume status No need for diuresis at this piont   Acute blood loss anemia superimposed on anemia of chronic disease  - s/p transfusion by CSS pm 12/10 Lab Results  Component Value Date   HGB 10.4 (L) 02/14/2021   HGB 10.1 (L) 02/13/2021   HGB 10.8 (L) 02/12/2021   - hgb stabilized with no further tx needed     CKD stage IIIb -Creatinine fluctuates between 1.5-1.8 with GFR ranging in the mid 30s Lab Results  Component Value Date   CREATININE 1.40 (H) 02/14/2021   CREATININE 1.78 (H) 02/12/2021   CREATININE 1.67 (H) 02/11/2021  Renal function at baseline Rec Continue to monitor    Best Practice (right click and "Reselect all SmartList  Selections" daily)   Diet/type: dysphagia diet (see orders) DVT prophylaxis: SCD GI prophylaxis: PPI Lines: N/A Foley:  N/A Code Status:  full code Last date of multidisciplinary goals of care discussion Per primary   Labs   CBC: Recent Labs  Lab 02/11/21 0251 02/11/21 0900 02/12/21 0245 02/12/21 2021 02/13/21 0305 02/14/21 0312  WBC 7.9  --   --   --   --  5.7  HGB 6.9* 8.4* 6.0* 10.8* 10.1* 10.4*  HCT 21.4* 25.3*  --  32.4*  --  31.4*  MCV 101.9*  --   --   --   --  95.7  PLT 137*  --   --   --   --  106*    Basic Metabolic Panel: Recent Labs  Lab 02/11/21 0251 02/12/21 0245 02/14/21 0312  NA 136  --  137  K 4.1 4.4 4.4  CL 99  --  99  CO2 29  --  32  GLUCOSE 136*  --  99  BUN 21  --  35*  CREATININE 1.67* 1.78* 1.40*  CALCIUM 8.7*  --  8.8*  MG 1.4*  --  1.6*   GFR: Estimated Creatinine Clearance: 32.5 mL/min (A) (by C-G formula based on SCr of 1.4 mg/dL (H)). Recent Labs  Lab 02/11/21 0251 02/14/21 0312  WBC 7.9 5.7    Liver Function Tests: No results for input(s): AST, ALT, ALKPHOS, BILITOT, PROT, ALBUMIN in the last 168 hours. No results for input(s): LIPASE, AMYLASE in the last 168 hours. No results for input(s): AMMONIA in the last 168 hours.  ABG    Component Value Date/Time   PHART 7.356 07/04/2020 0501   PCO2ART 43.9 07/04/2020 0501   PO2ART 81.0 (L) 07/04/2020 0501   HCO3 23.5 07/04/2020 0501   TCO2 32 07/15/2020 1800   ACIDBASEDEF 0.9 07/04/2020 0501   O2SAT 96.3 07/04/2020 0501     Coagulation Profile: No results for input(s): INR, PROTIME in the last 168 hours.  Cardiac Enzymes: No results for input(s): CKTOTAL, CKMB, CKMBINDEX, TROPONINI in the last 168 hours.  HbA1C: Hgb A1c MFr Bld  Date/Time Value Ref Range Status  02/04/2021 02:46 PM 5.8 (H) 4.8 - 5.6 % Final    Comment:    (NOTE) Pre diabetes:          5.7%-6.4%  Diabetes:              >6.4%  Glycemic control for   <7.0% adults with diabetes   07/06/2020  06:32 AM 5.4 4.8 - 5.6 % Final    Comment:    (NOTE) Pre diabetes:          5.7%-6.4%  Diabetes:              >6.4%  Glycemic control for   <7.0% adults with diabetes     CBG: Recent Labs  Lab 02/10/21 1120 02/10/21 2019  GLUCAP 84 130*      Christinia Gully, MD Pulmonary and [place] [phone]   After 7:00 pm call Elink  [phone]

## 2021-02-14 NOTE — Progress Notes (Signed)
Pt transferred to [facility]. Pt stable at time of transfer in wheelchair. Report called.

## 2021-02-15 ENCOUNTER — Telehealth: Payer: Self-pay | Admitting: Pulmonary Disease

## 2021-02-15 ENCOUNTER — Other Ambulatory Visit (HOSPITAL_COMMUNITY): Payer: Self-pay

## 2021-02-15 LAB — TYPE AND SCREEN
ABO/RH(D): O POS
Antibody Screen: NEGATIVE
Unit division: 0
Unit division: 0
Unit division: 0
Unit division: 0
Unit division: 0
Unit division: 0

## 2021-02-15 LAB — BPAM RBC
Blood Product Expiration Date: 202301092359
Blood Product Expiration Date: 202301092359
Blood Product Expiration Date: 202301092359
Blood Product Expiration Date: 202301092359
Blood Product Expiration Date: 202301102359
Blood Product Expiration Date: 202301102359
ISSUE DATE / TIME: 202212080501
ISSUE DATE / TIME: 202212090440
ISSUE DATE / TIME: 202212091024
ISSUE DATE / TIME: 202212091446
ISSUE DATE / TIME: 202212111852
Unit Type and Rh: 5100
Unit Type and Rh: 5100
Unit Type and Rh: 5100
Unit Type and Rh: 5100
Unit Type and Rh: 5100
Unit Type and Rh: 5100

## 2021-02-15 LAB — CBC
HCT: 35.2 % — ABNORMAL LOW (ref 36.0–46.0)
Hemoglobin: 11.4 g/dL — ABNORMAL LOW (ref 12.0–15.0)
MCH: 31.8 pg (ref 26.0–34.0)
MCHC: 32.4 g/dL (ref 30.0–36.0)
MCV: 98.3 fL (ref 80.0–100.0)
Platelets: 128 10*3/uL — ABNORMAL LOW (ref 150–400)
RBC: 3.58 MIL/uL — ABNORMAL LOW (ref 3.87–5.11)
RDW: 15.3 % (ref 11.5–15.5)
WBC: 5.7 10*3/uL (ref 4.0–10.5)
nRBC: 0 % (ref 0.0–0.2)

## 2021-02-15 LAB — BASIC METABOLIC PANEL
Anion gap: 8 (ref 5–15)
BUN: 32 mg/dL — ABNORMAL HIGH (ref 8–23)
CO2: 29 mmol/L (ref 22–32)
Calcium: 8.9 mg/dL (ref 8.9–10.3)
Chloride: 99 mmol/L (ref 98–111)
Creatinine, Ser: 1.49 mg/dL — ABNORMAL HIGH (ref 0.44–1.00)
GFR, Estimated: 38 mL/min — ABNORMAL LOW (ref >60)
Glucose, Bld: 102 mg/dL — ABNORMAL HIGH (ref 70–99)
Potassium: 5.1 mmol/L (ref 3.5–5.1)
Sodium: 136 mmol/L (ref 135–145)

## 2021-02-15 LAB — MAGNESIUM: Magnesium: 2.2 mg/dL (ref 1.7–2.4)

## 2021-02-15 MED ORDER — GUAIFENESIN ER 600 MG PO TB12
1200.0000 mg | ORAL_TABLET | Freq: Two times a day (BID) | ORAL | 3 refills | Status: AC
  Filled 2021-02-15: qty 30, 8d supply, fill #0

## 2021-02-15 MED ORDER — TRAMADOL HCL 50 MG PO TABS
50.0000 mg | ORAL_TABLET | Freq: Four times a day (QID) | ORAL | 0 refills | Status: AC | PRN
  Filled 2021-02-15: qty 20, 3d supply, fill #0

## 2021-02-15 NOTE — Discharge Summary (Signed)
Physician Discharge Summary    Patient ID: Tara [name] MRN: [id number] DOB/AGE: [birth year]  70 y.o.  Patient Care Team: Neale Burly, MD as PCP - General (Internal Medicine) Michael Boston, MD as Consulting Physician (General Surgery) Rogene Houston, MD as Consulting Physician (Gastroenterology) Chesley Mires, MD as Consulting Physician (Pulmonary Disease) Assar, Jodelle Skylin Kennerson, DO as Referring Physician (Cardiology)  Admit date: 02/10/2021  Discharge date: 02/15/2021  Hospital Stay = 5 days    Discharge Diagnoses:  Principal Problem:   Colovesical fistula Active Problems:   Chronic respiratory failure with hypoxia (HCC)   Cigarette smoker   CKD (chronic kidney disease), stage III (HCC)   Moderate COPD (chronic obstructive pulmonary disease) ([place])   Acute blood loss anemia ([facility]LA)   5 Days Post-Op  02/10/2021  POST-OPERATIVE DIAGNOSIS:   COLOVESICAL FISTULA PARTIAL COLON OBSTRUCTION   PROCEDURE:   ROBOTIC LOW ANTERIOR RECTOSIGMOID RESECTION  TAKEDOWN OF COLOVESICAL FISTULA DRAINAGE OF INTRAABDOMINAL ABSCESS TRANSVERSUS ABDOMINIS PLANE (TAP) BLOCK - BILATERAL INTRAOPERATIVE ASSESSMENT OF PERFUSION USING FIREFLY IMMUNOFLUORESCENCE RIGID PROCTOSCOPY   SURGEON:  Adin Hector, MD   OR FINDINGS:    Patient had very inflamed and torturous rectosigmoid colon with very dense adhesions to the left lateral pelvis and left dome of the bladder close to left ureteropelvic junction.  Small contained abscess in the region drained.   No obvious metastatic disease on visceral parietal peritoneum or liver.   The 31 EEA anastomosis rests 10 cm from the anal verge by rigid proctoscopy.   SURGICAL PATHOLOGY   CASE: [id number]  PATIENT: Tara [name]  Surgical Pathology Report   Clinical History: Colo vesical fistula (jmc)   FINAL MICROSCOPIC DIAGNOSIS:   A. COLON, RECTOSIGMOID, RESECTION:  - Acute diverticulitis with perforation and serositis.  - Four benign lymph  nodes.  - Negative for malignancy.   B. COLON, FINAL DISTAL MARGIN:  - Segment of unremarkable colon.  - Negative for active inflammation.  - Negative for malignancy.    Consults:  Pulmonary  Hospital Course:   The patient underwent the surgery above.  Postoperatively, the patient gradually mobilized and advanced to a solid diet.  Pain and other symptoms were treated aggressively.  Patient did have drop in hemoglobin but was stabilized after blood thinners were held and she was transfused.  Hemoglobin remained stable 48 hours.  I requested pulmonary consultation.  They adjusted some nebulizations but patient stabilized and was back to her baseline home oxygen level.  Feeling better.  By the time of discharge, the patient was walking well the hallways, eating food, having flatus.  Patient had bowel movement without any major hematochezia.  Drain had some mild serosanguineous output.  Mainly serous.  Being removed on day of discharge.  Pain was well-controlled on an oral medications.  She really wanted to go home.    Based on meeting discharge criteria and continuing to recover, I felt it was safe for the patient to be discharged from the hospital to further recover with close followup. Postoperative recommendations were discussed in detail.  They are written as well.  Discharged Condition: fair - but stable for her  Discharge Exam: Blood pressure 140/81, pulse 72, temperature 97.7 F (36.5 C), temperature source Oral, resp. rate 16, height 5\' 1"  (1.549 m), weight 63.8 kg, SpO2 95 %.  General: Pt awake/alert/oriented x4 in No acute distress.  Sitting up smiling.  Bright and alert. Eyes: PERRL, normal EOM.  Sclera clear.  No icterus Neuro: CN II-XII intact w/o focal sensory/motor  deficits. Lymph: No head/neck/groin lymphadenopathy Psych:  No delerium/psychosis/paranoia HENT: Normocephalic, Mucus membranes moist.  No thrush Neck: Supple, No tracheal deviation Chest:  No chest wall pain w  good excursion.  No audible wheezing nor conversational dyspnea CV:  Pulses intact.  Regular rhythm MS: Normal AROM mjr joints.  No obvious deformity Abdomen: Soft.  Moderately distended - but improved.  Nontender.  No evidence of peritonitis.  No incarcerated hernias. Ext:  SCDs BLE.  No mjr edema.  No cyanosis Skin: No petechiae / purpura   Disposition:    Follow-up Information     Michael Boston, MD Follow up in 3 week(s).   Specialties: General Surgery, Colon and Rectal Surgery Why: To follow up after your hospital stay Contact information: [place] [address] [phone]         Chesley Mires, MD Follow up in 1 month(s).   Specialty: Pulmonary Disease Why: To follow up after your hospital stay and see how your lungs are doing with your COPD and need for nebulizers/oxygen Contact information: [address] [place] [address] [phone]                 Discharge disposition: 01-Home or Self Care       Discharge Instructions     Call MD for:   Complete by: As directed    FEVER > 101.5 F  (temperatures < 101.5 F are not significant)   Call MD for:  extreme fatigue   Complete by: As directed    Call MD for:  persistant dizziness or light-headedness   Complete by: As directed    Call MD for:  persistant nausea and vomiting   Complete by: As directed    Call MD for:  redness, tenderness, or signs of infection (pain, swelling, redness, odor or green/yellow discharge around incision site)   Complete by: As directed    Call MD for:  severe uncontrolled pain   Complete by: As directed    Diet - low sodium heart healthy   Complete by: As directed    Start with a bland diet such as soups, liquids, starchy foods, low fat foods, etc. the first few days at home. Gradually advance to a solid, low-fat, high fiber diet by the end of the first week at home.   Add a fiber supplement to your diet (Metamucil, etc) If you  feel full, bloated, or constipated, stay on a full liquid or pureed/blenderized diet for a few days until you feel better and are no longer constipated.   Discharge instructions   Complete by: As directed    See Discharge Instructions If you are not getting better after two weeks or are noticing you are getting worse, contact our office [phone] for further advice.  We may need to adjust your medications, re-evaluate you in the office, send you to the emergency room, or see what other things we can do to help. The clinic staff is available to answer your questions during regular business hours (8:30am-5pm).  Please don't hesitate to call and ask to speak to one of our nurses for clinical concerns.    A surgeon from [facility] is always on call at the hospitals 24 hours/day If you have a medical emergency, go to the nearest emergency room or call 911.   Discharge wound care:   Complete by: As directed    It is good for closed incisions and even open wounds  to be washed every day.  Shower every day.  Short baths are fine.  Wash the incisions and wounds clean with soap & water.    You may leave closed incisions open to air if it is dry.   You may cover the incision with clean gauze & replace it after your daily shower for comfort.   Driving Restrictions   Complete by: As directed    You may drive when: - you are no longer taking narcotic prescription pain medication - you can comfortably wear a seatbelt - you can safely make sudden turns/stops without pain.   Increase activity slowly   Complete by: As directed    Start light daily activities --- self-care, walking, climbing stairs- beginning the day after surgery.  Gradually increase activities as tolerated.  Control your pain to be active.  Stop when you are tired.  Ideally, walk several times a day, eventually an hour a day.   Most people are back to most day-to-day activities in a few weeks.  It takes 4-6 weeks to get back to  unrestricted, intense activity. If you can walk 30 minutes without difficulty, it is safe to try more intense activity such as jogging, treadmill, bicycling, low-impact aerobics, swimming, etc. Save the most intensive and strenuous activity for last (Usually 4-8 weeks after surgery) such as sit-ups, heavy lifting, contact sports, etc.  Refrain from any intense heavy lifting or straining until you are off narcotics for pain control.  You will have off days, but things should improve week-by-week. DO NOT PUSH THROUGH PAIN.  Let pain be your guide: If it hurts to do something, don't do it.   Lifting restrictions   Complete by: As directed    If you can walk 30 minutes without difficulty, it is safe to try more intense activity such as jogging, treadmill, bicycling, low-impact aerobics, swimming, etc. Save the most intensive and strenuous activity for last (Usually 4-8 weeks after surgery) such as sit-ups, heavy lifting, contact sports, etc.   Refrain from any intense heavy lifting or straining until you are off narcotics for pain control.  You will have off days, but things should improve week-by-week. DO NOT PUSH THROUGH PAIN.  Let pain be your guide: If it hurts to do something, don't do it.  Pain is your body warning you to avoid that activity for another week until the pain goes down.   May shower / Bathe   Complete by: As directed    May walk up steps   Complete by: As directed    Sexual Activity Restrictions   Complete by: As directed    You may have sexual intercourse when it is comfortable. If it hurts to do something, stop.       Allergies as of 02/15/2021       Reactions   Codeine Nausea And Vomiting   Patient reports that it makes her nauseated.         Medication List     TAKE these medications    aspirin EC 81 MG tablet Take 81 mg by mouth at bedtime. Swallow whole.   atorvastatin 20 MG tablet Commonly known as: LIPITOR Take 20 mg by mouth at bedtime.   diltiazem  120 MG 24 hr capsule Commonly known as: CARDIZEM CD Take 120 mg by mouth daily.   Eliquis 5 MG Tabs tablet Generic drug: apixaban Take 2.5 mg by mouth 2 (two) times daily.   Euthyrox 50 MCG tablet Generic drug: levothyroxine Take 50  mcg by mouth daily before breakfast.   Fish Oil 1000 MG Caps Take 1,000 mg by mouth in the morning and at bedtime.   furosemide 20 MG tablet Commonly known as: Lasix Take 1 tablet (20 mg total) by mouth daily. What changed: when to take this   guaiFENesin 600 MG 12 hr tablet Commonly known as: MUCINEX Take 2 tablets (1,200 mg total) by mouth 2 (two) times daily.   Krill Oil 1000 MG Caps Take 1,000 mg by mouth daily.   levalbuterol 0.63 MG/3ML nebulizer solution Commonly known as: XOPENEX Take 0.63 mg by nebulization every 4 (four) hours as needed for wheezing or shortness of breath.   LORazepam 0.5 MG tablet Commonly known as: ATIVAN Take 0.5 mg by mouth 2 (two) times daily.   pantoprazole 40 MG tablet Commonly known as: PROTONIX Take 40 mg by mouth in the morning.   potassium chloride SA 20 MEQ tablet Commonly known as: KLOR-CON M Take 20 mEq by mouth at bedtime.   Spiriva Respimat 2.5 MCG/ACT Aers Generic drug: Tiotropium Bromide Monohydrate Inhale 2 puffs into the lungs daily. What changed: Another medication with the same name was removed. Continue taking this medication, and follow the directions you see here.   tiZANidine 4 MG capsule Commonly known as: ZANAFLEX Take 4 mg by mouth at bedtime.   traMADol 50 MG tablet Commonly known as: ULTRAM Take 1-2 tablets (50-100 mg total) by mouth every 6 (six) hours as needed for moderate pain.   Vitamin D3 10 MCG (400 UNIT) Caps Take 800 Units by mouth daily.               Discharge Care Instructions  (From admission, onward)           Start     Ordered   02/15/21 0000  Discharge wound care:       Comments: It is good for closed incisions and even open wounds to be  washed every day.  Shower every day.  Short baths are fine.  Wash the incisions and wounds clean with soap & water.    You may leave closed incisions open to air if it is dry.   You may cover the incision with clean gauze & replace it after your daily shower for comfort.   02/15/21 0831            Significant Diagnostic Studies:  SURGICAL PATHOLOGY  CASE: [id number]  PATIENT: Juniper Partridge  Surgical Pathology Report      Clinical History: Colo vesical fistula (jmc)      FINAL MICROSCOPIC DIAGNOSIS:   A. COLON, RECTOSIGMOID, RESECTION:  - Acute diverticulitis with perforation and serositis.  - Four benign lymph nodes.  - Negative for malignancy.   B. COLON, FINAL DISTAL MARGIN:  - Segment of unremarkable colon.  - Negative for active inflammation.  - Negative for malignancy.    Caris Cerveny DESCRIPTION:   Specimen A: Colon resection, clinically rectosigmoid, open end proximal,  received fresh.  Length: 19 cm in length tortuous segment.  Serosa: Pink-red to dark red, diffusely roughened with adhesions.  Within the mid segment is a 0.5 cm transmural defect.  There is also  attached soft to focally indurated fatty tissue.  Contents: Small amount of light green-brown soft material.  Mucosa/Wall: The mucosa is tan-pink to hyperemic, smooth, soft with  prominent and widely spaced intestinal folds.  The wall is up to 2.2 cm  thick, and has multiple diverticula scattered throughout the segment, 1  of  which is involved with the transmural defect.  There are no mass  lesions.  Additional findings: The area of indurated fatty tissue is 2.3 x 1.8 x  1.7 cm and has yellow-white to dark red dense cut surface.  Lymph nodes: 4 possible lymph nodes are sampled.  Block Summary:  Block 1 = proximal margin  Block 2 = distal margin  Block 3 = section of diverticulum involved with transmural defect  Block 4 = sections of indurated fatty tissue  Block 5 = sampling of 4 nodes    Specimen B: Received fresh is a 1.6 cm in diameter and up to 0.6 cm  thick disrupted ring of tan-pink smooth, soft mucosa and underlying  muscularis with few embedded metallic staples.  Representative sections  are submitted 1 block.   SW 02/11/2021     Final Diagnosis performed by Betsy Pries, MD.   Electronically signed  02/12/2021  Technical component performed at [facility], [place]  [address] [place], [place] [address].   Professional component performed at [facility].  [address] [place], [place] [address]  Immunohistochemistry  Technical component (if applicable) was performed at [facility]. [address], [place], [place], [place]  [address].  IMMUNOHISTOCHEMISTRY DISCLAIMER (if applicable): Some of these  immunohistochemical stains may have been developed and the performance  characteristics determine by [facility]. Some may not have  been cleared or approved by the U.S. Food and Drug Administration. The  FDA has determined that such clearance or approval is not necessary.  This test is used for clinical purposes. It should not be regarded as  investigational or for research. This laboratory is certified under the  [place] (CLIA-88) as  qualified to perform high complexity clinical laboratory testing.  The  controls stained appropriately.    Results for orders placed or performed during the hospital encounter of 02/10/21 (from the past 72 hour(s))  Hemoglobin and hematocrit, blood     Status: Abnormal   Collection Time: 02/12/21  8:21 PM  Result Value Ref Range   Hemoglobin 10.8 (L) 12.0 - 15.0 g/dL    Comment: REPEATED TO VERIFY POST TRANSFUSION SPECIMEN    HCT 32.4 (L) 36.0 - 46.0 %    Comment: Performed at [facility], [place] [address] [place], [place] [address]  Hemoglobin     Status: Abnormal   Collection  Time: 02/13/21  3:05 AM  Result Value Ref Range   Hemoglobin 10.1 (L) 12.0 - 15.0 g/dL    Comment: Performed at [facility], [place] [address], [place], [place] [address]  CBC     Status: Abnormal   Collection Time: 02/14/21  3:12 AM  Result Value Ref Range   WBC 5.7 4.0 - 10.5 K/uL   RBC 3.28 (L) 3.87 - 5.11 MIL/uL   Hemoglobin 10.4 (L) 12.0 - 15.0 g/dL   HCT 31.4 (L) 36.0 - 46.0 %   MCV 95.7 80.0 - 100.0 fL   MCH 31.7 26.0 - 34.0 pg   MCHC 33.1 30.0 - 36.0 g/dL   RDW 15.9 (H) 11.5 - 15.5 %   Platelets 106 (L) 150 - 400 K/uL    Comment: SPECIMEN CHECKED FOR CLOTS Immature Platelet Fraction may be clinically indicated, consider ordering this additional test [id number] REPEATED TO VERIFY PLATELET COUNT CONFIRMED BY SMEAR    nRBC 0.0 0.0 - 0.2 %    Comment: Performed at [facility]  [place] [address] [place], [place] [address]  Basic metabolic panel     Status: Abnormal   Collection Time: 02/14/21  3:12 AM  Result Value Ref Range   Sodium 137 135 - 145 mmol/L   Potassium 4.4 3.5 - 5.1 mmol/L   Chloride 99 98 - 111 mmol/L   CO2 32 22 - 32 mmol/L   Glucose, Bld 99 70 - 99 mg/dL    Comment: Glucose reference range applies only to samples taken after fasting for at least 8 hours.   BUN 35 (H) 8 - 23 mg/dL   Creatinine, Ser 1.40 (H) 0.44 - 1.00 mg/dL   Calcium 8.8 (L) 8.9 - 10.3 mg/dL   GFR, Estimated 41 (L) >60 mL/min    Comment: (NOTE) Calculated using the CKD-EPI Creatinine Equation (2021)    Anion gap 6 5 - 15    Comment: Performed at [facility], [place] [address] [place], [place] [address]  Magnesium     Status: Abnormal   Collection Time: 02/14/21  3:12 AM  Result Value Ref Range   Magnesium 1.6 (L) 1.7 - 2.4 mg/dL    Comment: Performed at [facility], [place] [address], [place], [place] [address]  CBC     Status: Abnormal   Collection Time: 02/15/21  4:31 AM  Result Value Ref Range   WBC 5.7 4.0 -  10.5 K/uL   RBC 3.58 (L) 3.87 - 5.11 MIL/uL   Hemoglobin 11.4 (L) 12.0 - 15.0 g/dL   HCT 35.2 (L) 36.0 - 46.0 %   MCV 98.3 80.0 - 100.0 fL   MCH 31.8 26.0 - 34.0 pg   MCHC 32.4 30.0 - 36.0 g/dL   RDW 15.3 11.5 - 15.5 %   Platelets 128 (L) 150 - 400 K/uL    Comment: Immature Platelet Fraction may be clinically indicated, consider ordering this additional test [id number]    nRBC 0.0 0.0 - 0.2 %    Comment: Performed at [facility], [place] [address] [place], [place] [address]  Basic metabolic panel     Status: Abnormal   Collection Time: 02/15/21  4:31 AM  Result Value Ref Range   Sodium 136 135 - 145 mmol/L   Potassium 5.1 3.5 - 5.1 mmol/L   Chloride 99 98 - 111 mmol/L   CO2 29 22 - 32 mmol/L   Glucose, Bld 102 (H) 70 - 99 mg/dL    Comment: Glucose reference range applies only to samples taken after fasting for at least 8 hours.   BUN 32 (H) 8 - 23 mg/dL   Creatinine, Ser 1.49 (H) 0.44 - 1.00 mg/dL   Calcium 8.9 8.9 - 10.3 mg/dL   GFR, Estimated 38 (L) >60 mL/min    Comment: (NOTE) Calculated using the CKD-EPI Creatinine Equation (2021)    Anion gap 8 5 - 15    Comment: Performed at [facility], [place] [address], [place], [place] [address]  Magnesium     Status: None   Collection Time: 02/15/21  4:31 AM  Result Value Ref Range   Magnesium 2.2 1.7 - 2.4 mg/dL    Comment: Performed at [facility], [place] [address], [place], [place] [address]    No results found.  Past Medical History:  Diagnosis Date   Arthritis    back   CHF (congestive heart failure) (HCC)    COPD (chronic obstructive pulmonary disease) (HCC)    Hx of sleep  apnea 12/07/2020   Hypothyroidism    Pre-diabetes     Past Surgical History:  Procedure Laterality Date   ABDOMINAL HYSTERECTOMY  1985   BSO and appy   BACK SURGERY  2006   rod and screws   CHOLECYSTECTOMY  1995   OSTOMY N/A 02/10/2021   Procedure: POSSIBLE OSTOMY;  Surgeon: Michael Boston, MD;  Location: [facility];  Service: General;  Laterality: N/A;   PROCTOSCOPY N/A 02/10/2021   Procedure: RIGID PROCTOSCOPY;  Surgeon: Michael Boston, MD;  Location: [facility];  Service: General;  Laterality: N/A;   TUBAL LIGATION  1977    Social History   Socioeconomic History   Marital status: Widowed    Spouse name: Not on file   Number of children: Not on file   Years of education: Not on file   Highest education level: Not on file  Occupational History   Not on file  Tobacco Use   Smoking status: Some Days    Packs/day: 0.50    Years: 57.00    Pack years: 28.50    Types: Cigarettes   Smokeless tobacco: Never  Vaping Use   Vaping Use: Never used  Substance and Sexual Activity   Alcohol use: Not Currently   Drug use: Never   Sexual activity: Not Currently  Other Topics Concern   Not on file  Social History Narrative   Not on file   Social Determinants of Health   Financial Resource Strain: Not on file  Food Insecurity: Not on file  Transportation Needs: Not on file  Physical Activity: Not on file  Stress: Not on file  Social Connections: Not on file  Intimate Partner Violence: Not on file    History reviewed. No pertinent family history.  Current Facility-Administered Medications  Medication Dose Route Frequency Provider Last Rate Last Admin   0.9 %  sodium chloride infusion  250 mL Intravenous PRN Michael Boston, MD       acetaminophen (TYLENOL) tablet 1,000 mg  1,000 mg Oral Lajuana Ripple, MD   1,000 mg at 02/15/21 0619   albuterol (PROVENTIL) (2.5 MG/3ML) 0.083% nebulizer solution 2.5 mg  2.5 mg Nebulization Q4H PRN Michael Boston, MD       alum & mag hydroxide-simeth (MAALOX/MYLANTA) 200-200-20 MG/5ML suspension 30 mL  30 mL Oral Q6H PRN Michael Boston, MD       aspirin EC tablet 81 mg  81 mg Oral Ardeen Fillers, MD   81 mg at 02/14/21 2223   chlorhexidine (PERIDEX) 0.12 % solution 15 mL  15 mL Mouth Rinse BID Michael Boston, MD   15 mL at 02/15/21 0816    Chlorhexidine Gluconate Cloth 2 % PADS 6 each  6 each Topical Daily Michael Boston, MD   6 each at 02/15/21 0816   cholecalciferol (VITAMIN D3) tablet 800 Units  800 Units Oral Daily Michael Boston, MD   800 Units at 02/14/21 1218   diltiazem (CARDIZEM CD) 24 hr capsule 120 mg  120 mg Oral Daily Michael Boston, MD   120 mg at 02/15/21 6834   diphenhydrAMINE (BENADRYL) 12.5 MG/5ML elixir 12.5 mg  12.5 mg Oral Q6H PRN Michael Boston, MD       Or   diphenhydrAMINE (BENADRYL) injection 12.5 mg  12.5 mg Intravenous Q6H PRN Michael Boston, MD       enalaprilat (VASOTEC) injection 0.625-1.25 mg  0.625-1.25 mg Intravenous Q6H PRN Michael Boston, MD       enoxaparin (LOVENOX) injection 40 mg  40 mg Subcutaneous Q24H Romana Juniper A, MD   40 mg at 02/15/21 0813   feeding supplement (ENSURE SURGERY) liquid 237 mL  237 mL Oral BID BM Michael Boston, MD   237 mL at 02/14/21 1717   gabapentin (NEURONTIN) capsule 100 mg  100 mg Oral TID Michael Boston, MD   100 mg at 02/15/21 0814   guaiFENesin (MUCINEX) 12 hr tablet 1,200 mg  1,200 mg Oral BID Tanda Rockers, MD   1,200 mg at 02/15/21 3614   HYDROmorphone (DILAUDID) injection 0.5-2 mg  0.5-2 mg Intravenous Q4H PRN Michael Boston, MD       ipratropium-albuterol (DUONEB) 0.5-2.5 (3) MG/3ML nebulizer solution 3 mL  3 mL Nebulization TID Michael Boston, MD   3 mL at 02/14/21 2014   levothyroxine (SYNTHROID) tablet 50 mcg  50 mcg Oral QAC breakfast Michael Boston, MD   50 mcg at 02/15/21 4315   lip balm (CARMEX) ointment 1 application  1 application Topical BID Michael Boston, MD   1 application at 40/08/67 0539   LORazepam (ATIVAN) tablet 0.5 mg  0.5 mg Oral BID Michael Boston, MD   0.5 mg at 02/15/21 6195   magic mouthwash  15 mL Oral QID PRN Michael Boston, MD       MEDLINE mouth rinse  15 mL Mouth Rinse Tana Conch, MD   15 mL at 02/14/21 1722   melatonin tablet 3 mg  3 mg Oral QHS PRN Michael Boston, MD       methocarbamol (ROBAXIN) 1,000 mg in dextrose 5 % 100  mL IVPB  1,000 mg Intravenous Q6H PRN Michael Boston, MD       methocarbamol (ROBAXIN) tablet 1,000 mg  1,000 mg Oral Q6H PRN Michael Boston, MD   1,000 mg at 02/12/21 1509   metoprolol tartrate (LOPRESSOR) injection 5 mg  5 mg Intravenous Q6H PRN Michael Boston, MD       ondansetron [facility]) tablet 4 mg  4 mg Oral Q6H PRN Michael Boston, MD       Or   ondansetron [facility]) injection 4 mg  4 mg Intravenous Q6H PRN Michael Boston, MD       pantoprazole (PROTONIX) EC tablet 40 mg  40 mg Oral q AM Michael Boston, MD   40 mg at 02/15/21 0619   polycarbophil (FIBERCON) tablet 625 mg  625 mg Oral BID Michael Boston, MD   625 mg at 02/15/21 0932   potassium chloride SA (KLOR-CON M) CR tablet 20 mEq  20 mEq Oral Ardeen Fillers, MD   20 mEq at 02/14/21 2223   prochlorperazine (COMPAZINE) tablet 10 mg  10 mg Oral Q6H PRN Michael Boston, MD       Or   prochlorperazine (COMPAZINE) injection 5-10 mg  5-10 mg Intravenous Q6H PRN Michael Boston, MD       simethicone (MYLICON) chewable tablet 40 mg  40 mg Oral Q6H PRN Michael Boston, MD   40 mg at 02/13/21 1315   sodium chloride flush (NS) 0.9 % injection 3 mL  3 mL Intravenous Gorden Harms, MD   3 mL at 02/14/21 2200   sodium chloride flush (NS) 0.9 % injection 3 mL  3 mL Intravenous PRN Michael Boston, MD       tiZANidine (ZANAFLEX) tablet 4 mg  4 mg Oral Ardeen Fillers, MD   4 mg at 02/14/21 2222   traMADol (ULTRAM) tablet 50-100 mg  50-100 mg Oral Q6H PRN Michael Boston, MD  100 mg at 02/12/21 1509     Allergies  Allergen Reactions   Codeine Nausea And Vomiting    Patient reports that it makes her nauseated.     Signed: Mizrahi Peters, MD, FACS, MASCRS Esophageal, Gastrointestinal & Colorectal Surgery Robotic and Minimally Invasive Surgery  [facility], [place]  [address], [place], [place] [address] [phone] Fax [phone] Main  CONTACT  INFORMATION:  Weekday (9AM-5PM): Call [facility] main office at [phone]  Weeknight (5PM-9AM) or Weekend/Holiday: Check [email or web address] (password " [email or web address]") for General Surgery [facility] coverage  (Please, do not use SecureChat as it is not reliable communication to operating surgeons for immediate patient care)      02/15/2021, 8:33 AM

## 2021-02-15 NOTE — Evaluation (Signed)
Physical Therapy Evaluation Patient Details Name: Tara [name] MRN: [id number] DOB: [birth year] Today's Date: 02/15/2021  History of Present Illness  Tara [name] is a 70 y.o. female active smoker  with a past medical history significant for COPD, daily tobacco use, CHF, arthritis, sleep apnea, hypothyroidism, and prediabetes presented for elective robotic colon resection and ostomy establishment for colovesicular fistula with Dr. Johney Maine 12/7.  Clinical Impression  Pt admitted with above diagnosis. Pt currently with functional limitations due to the deficits listed below (see PT Problem List). Pt will benefit from skilled PT to increase their independence and safety with mobility to allow discharge to the venue listed below.  Pt's gait limited by drainage, but she was able to ambulate in the room with RW.  She reports falls at home and feel she would benefit from [place].        Recommendations for follow up therapy are one component of a multi-disciplinary discharge planning process, led by the attending physician.  Recommendations may be updated based on patient status, additional functional criteria and insurance authorization.  Follow Up Recommendations Home health PT    Assistance Recommended at Discharge Intermittent Supervision/Assistance  Functional Status Assessment Patient has had a recent decline in their functional status and demonstrates the ability to make significant improvements in function in a reasonable and predictable amount of time.  Equipment Recommendations  None recommended by PT    Recommendations for Other Services       Precautions / Restrictions Precautions Precautions: Fall      Mobility  Bed Mobility Overal bed mobility: Modified Independent Bed Mobility: Sidelying to Sit;Rolling Rolling: Supervision Sidelying to sit: Supervision       General bed mobility comments: cues for technique, heavy use of rail.    Transfers   Equipment used: Rolling  walker (2 wheels)               General transfer comment: poor hand placement and trying to pull-up on RW. With standing, drain site began to drip blood all over and sat back down and nurse came and placed some gauze over the site.    Ambulation/Gait Ambulation/Gait assistance: Min guard Gait Distance (Feet): 10 Feet Assistive device: Rolling walker (2 wheels) Gait Pattern/deviations: Decreased step length - left;Decreased step length - right       General Gait Details: Amb in room only as drain site began bleeding again onto the floor. Pt would have been able to go farther if it hadn't been for the drainage. Was reliant on the RW.  Stairs            Wheelchair Mobility    Modified Rankin (Stroke Patients Only)       Balance                                             Pertinent Vitals/Pain Pain Assessment: 0-10 Pain Score: 1  Pain Location: abdomen Pain Descriptors / Indicators: Other (Comment) (bubbles) Pain Intervention(s): Monitored during session    Home Living Family/patient expects to be discharged to:: Private residence Living Arrangements: Children;Other relatives Available Help at Discharge: Family Type of Home: House Home Access: Stairs to enter Entrance Stairs-Rails: Right Entrance Stairs-Number of Steps: 7   Home Layout: One level Home Equipment: Grab bars - tub/shower;None;Rollator (4 wheels);Shower seat - built in      Prior Function Prior Level  of Function : Independent/Modified Independent             Mobility Comments: AMb with cane in public, at home cruises and uses wall for balance if needed       Hand Dominance        Extremity/Trunk Assessment   Upper Extremity Assessment Upper Extremity Assessment: Overall WFL for tasks assessed    Lower Extremity Assessment Lower Extremity Assessment: Generalized weakness    Cervical / Trunk Assessment Cervical / Trunk Assessment: Kyphotic  Communication    Communication: No difficulties  Cognition Arousal/Alertness: Awake/alert Behavior During Therapy: WFL for tasks assessed/performed Overall Cognitive Status: Within Functional Limits for tasks assessed                                          General Comments      Exercises     Assessment/Plan    PT Assessment Patient needs continued PT services  PT Problem List Decreased strength;Decreased activity tolerance;Decreased balance;Decreased mobility       PT Treatment Interventions DME instruction;Gait training;Functional mobility training;Therapeutic activities;Therapeutic exercise    PT Goals (Current goals can be found in the Care Plan section)  Acute Rehab PT Goals Patient Stated Goal: go home PT Goal Formulation: With patient Time For Goal Achievement: 03/01/21 Potential to Achieve Goals: Good    Frequency Min 3X/week   Barriers to discharge        Co-evaluation               AM-PAC PT "6 Clicks" Mobility  Outcome Measure Help needed turning from your back to your side while in a flat bed without using bedrails?: A Little Help needed moving from lying on your back to sitting on the side of a flat bed without using bedrails?: A Little Help needed moving to and from a bed to a chair (including a wheelchair)?: A Little Help needed standing up from a chair using your arms (e.g., wheelchair or bedside chair)?: A Little Help needed to walk in hospital room?: A Little Help needed climbing 3-5 steps with a railing? : A Lot 6 Click Score: 17    End of Session Equipment Utilized During Treatment: Gait belt Activity Tolerance: Treatment limited secondary to medical complications (Comment) (bleeding) Patient left: in chair;with call bell/phone within reach Nurse Communication: Other (comment) (drainage) PT Visit Diagnosis: Muscle weakness (generalized) (M62.81);Difficulty in walking, not elsewhere classified (R26.2);History of falling (Z91.81)     Time: 4742-5956 PT Time Calculation (min) (ACUTE ONLY): 32 min   Charges:   PT Evaluation $PT Eval Moderate Complexity: 1 Mod PT Treatments $Therapeutic Activity: 8-22 mins        Mallissa Lorenzen L. Tamala Julian, [place]  02/15/2021   Galen Manila 02/15/2021, 10:02 AM

## 2021-02-15 NOTE — Care Management Important Message (Signed)
Important Message  Patient Details IM Letter given to the Patient. Name: Tara [name] MRN: [id number] Date of Birth: 08-08-1950   Medicare Important Message Given:  Yes     Kerin Salen 02/15/2021, 2:37 PM

## 2021-02-15 NOTE — Telephone Encounter (Signed)
PCCM:  Patient has been discharged from [facility]. Patient last seen in the office by Dr. Ricarda Frame. Please scheduled HFU next available with BW or VS.   Garner Nash, DO [facility] 02/15/2021 9:09 AM

## 2021-02-15 NOTE — Progress Notes (Signed)
Occupational Therapy Treatment Patient Details Name: Tara [name] MRN: [id number] DOB: [birth year] Today's Date: 02/15/2021   History of present illness Tara [name] is a 70 y.o. female active smoker  with a past medical history significant for COPD, daily tobacco use, CHF, arthritis, sleep apnea, hypothyroidism, and prediabetes presented for elective robotic colon resection and ostomy establishment for colovesicular fistula with Dr. Johney Maine 12/7.   OT comments  Patient was approached later and completed ADLs with AD. Patient was noted to have improved ability to participate in ADLS with improved balance and safety awareness with RW and then with rollator as patient stated she had one at home. Patient was educated on ECT and [facility] services. Patient verbalized understanding. Patient would continue to benefit from skilled OT services at this time while admitted and after d/c to address noted deficits in order to improve overall safety and independence in ADLs.     Recommendations for follow up therapy are one component of a multi-disciplinary discharge planning process, led by the attending physician.  Recommendations may be updated based on patient status, additional functional criteria and insurance authorization.    Follow Up Recommendations  Home health OT    Assistance Recommended at Discharge Frequent or constant Supervision/Assistance  Equipment Recommendations  None recommended by OT (has rollator at home)    Recommendations for Other Services      Precautions / Restrictions Precautions Precautions: Fall Precaution Comments: jp drain Restrictions Weight Bearing Restrictions: No       Mobility Bed Mobility Overal bed mobility: Modified Independent             General bed mobility comments: patient was up in recliner    Transfers Overall transfer level: Needs assistance Equipment used: Rollator (4 wheels) Transfers: Sit to/from Stand Sit to Stand: Supervision            General transfer comment: patient was min guard for sit to stand with min A to maintain standing balance for tasks.     Balance Overall balance assessment: Needs assistance Sitting-balance support: No upper extremity supported;Feet supported Sitting balance-Leahy Scale: Good     Standing balance support: Reliant on assistive device for balance;During functional activity Standing balance-Leahy Scale: Fair                             ADL either performed or assessed with clinical judgement   ADL Overall ADL's : Needs assistance/impaired Eating/Feeding: Set up;Sitting   Grooming: Wash/dry face;Sitting   Upper Body Bathing: Set up;Sitting   Lower Body Bathing: Minimal assistance;Sit to/from stand;Sitting/lateral leans   Upper Body Dressing : Set up;Sitting   Lower Body Dressing: Minimal assistance;Sit to/from stand;Sitting/lateral leans   Toilet Transfer: Supervision/safety;Ambulation;Rollator (4 wheels) Toilet Transfer Details (indicate cue type and reason): patient was able to complete transfers with RW and rollator to and from bathroom with SUP. patient was noted to have improved balance and safety awareness with AD. patient reported having rollator at home and would keep it with her. Toileting- Clothing Manipulation and Hygiene: Minimal assistance;Sit to/from stand;Sitting/lateral lean       Functional mobility during ADLs: Minimal assistance;Cueing for safety;Cueing for sequencing General ADL Comments: patient was educated on importance of ECT at home and focus on needed activities. patient verbalized understanding. patient was educated on using rollator all times at home until home health can progress. patient verbalized understanding.    Extremity/Trunk Assessment Upper Extremity Assessment Upper Extremity Assessment: Overall WFL for  tasks assessed   Lower Extremity Assessment Lower Extremity Assessment: Defer to PT evaluation   Cervical / Trunk  Assessment Cervical / Trunk Assessment: Kyphotic    Vision Baseline Vision/History: 1 Wears glasses Ability to See in Adequate Light: 1 Impaired Patient Visual Report: No change from baseline     Perception     Praxis      Cognition Arousal/Alertness: Awake/alert Behavior During Therapy: WFL for tasks assessed/performed Overall Cognitive Status: Within Functional Limits for tasks assessed                                 General Comments: patient has some moments of poor safety awareness during session. poor insight to deficits as well.          Exercises     Shoulder Instructions       General Comments      Pertinent Vitals/ Pain       Pain Assessment: Faces Faces Pain Scale: Hurts a little bit Pain Location: abdomen Pain Descriptors / Indicators: Discomfort Pain Intervention(s): Monitored during session  Home Living Family/patient expects to be discharged to:: Private residence Living Arrangements: Children;Other relatives Available Help at Discharge: Family Type of Home: House Home Access: Stairs to enter [employer] of Steps: 7 Entrance Stairs-Rails: Right Home Layout: One level     Bathroom Shower/Tub: Teacher, early years/pre: [place]: Grab bars - tub/shower;None;Rollator (4 wheels);Shower seat - built in          Prior Functioning/Environment              Frequency  Min 2X/week        Progress Toward Goals  OT Goals(current goals can now be found in the care plan section)  Progress towards OT goals: Progressing toward goals  Acute Rehab OT Goals Patient Stated Goal: to get back home OT Goal Formulation: With patient Time For Goal Achievement: 03/01/21 Potential to Achieve Goals: Fair ADL Goals Pt Will Transfer to Toilet: with supervision;ambulating;regular height toilet Additional ADL Goal #1: Patient will perform 10 min functional activity or exercise activity as evidence  of improving activity tolerance  Plan Discharge plan remains appropriate    Co-evaluation                 AM-PAC OT "6 Clicks" Daily Activity     Outcome Measure   Help from another person eating meals?: None Help from another person taking care of personal grooming?: None Help from another person toileting, which includes using toliet, bedpan, or urinal?: A Little Help from another person bathing (including washing, rinsing, drying)?: A Little Help from another person to put on and taking off regular upper body clothing?: None Help from another person to put on and taking off regular lower body clothing?: A Little 6 Click Score: 21    End of Session Equipment Utilized During Treatment: Rollator (4 wheels)  OT Visit Diagnosis: Unsteadiness on feet (R26.81);Muscle weakness (generalized) (M62.81);Other symptoms and signs involving cognitive function   Activity Tolerance Patient tolerated treatment well   Patient Left in bed;with call bell/phone within reach   Nurse Communication          Time: 1331-1356 OT Time Calculation (min): 25 min  Charges: OT General Charges $OT Visit: 1 Visit OT Evaluation $OT Eval Low Complexity: 1 Low OT Treatments $Self Care/Home Management : 23-37 mins  Torin Modica OTR/L, MS Acute  Rehabilitation Department Office# [phone] Pager# [phone]   Marcellina Millin 02/15/2021, 2:03 PM

## 2021-02-15 NOTE — Progress Notes (Signed)
Discharge instructions discussed with patient and family, verbalized agreement and understanding 

## 2021-02-15 NOTE — Telephone Encounter (Signed)
Called and spoke with patient's daughter [place]. She stated that her mom hasn't been discharged yet. Advised her that we would call back in a few days to check on her and get her scheduled for a hospital f/u.

## 2021-02-15 NOTE — Evaluation (Signed)
Occupational Therapy Evaluation Patient Details Name: Tara [name] MRN: [id number] DOB: [birth year] Today's Date: 02/15/2021   History of Present Illness Tara [name] is a 70 y.o. female active smoker  with a past medical history significant for COPD, daily tobacco use, CHF, arthritis, sleep apnea, hypothyroidism, and prediabetes presented for elective robotic colon resection and ostomy establishment for colovesicular fistula with Dr. Johney [name] 12/7.   Clinical Impression   Patient is a 70 year old female who appeared to have poor insight to deficits and limitations at this time. Patient was noted to have drainage from JP site through dressing with increased dripping with movement. Patients session was cut short with nursing in room to remove JP drain.  Patient was min A for functional mobility in room to maintain standing balance with patient attempting to furniture walk to bathroom and back. Patient indicated that daughter works during the day. Patient would need 24/7 caregiver support to be able to transition home at this time. Patient would continue to benefit from skilled OT services at this time while admitted and after d/c to address noted deficits in order to improve overall safety and independence in ADLs.       Recommendations for follow up therapy are one component of a multi-disciplinary discharge planning process, led by the attending physician.  Recommendations may be updated based on patient status, additional functional criteria and insurance authorization.   Follow Up Recommendations  Home health OT    Assistance Recommended at Discharge Frequent or constant Supervision/Assistance  Functional Status Assessment  Patient has had a recent decline in their functional status and demonstrates the ability to make significant improvements in function in a reasonable and predictable amount of time.  Equipment Recommendations  None recommended by OT    Recommendations for Other  Services       Precautions / Restrictions Precautions Precautions: Fall Precaution Comments: jp drain Restrictions Weight Bearing Restrictions: No      Mobility Bed Mobility Overal bed mobility: Modified Independent Bed Mobility: Sidelying to Sit;Rolling Rolling: Supervision Sidelying to sit: Supervision       General bed mobility comments: patient was up in recliner    Transfers Overall transfer level: Needs assistance Equipment used: 1 person hand held assist Transfers: Sit to/from Stand Sit to Stand: Min assist           General transfer comment: patient was min guard for sit to stand with min A to maintain standing balance for tasks.      Balance Overall balance assessment: Needs assistance Sitting-balance support: No upper extremity supported;Feet supported Sitting balance-Tara [name]: Good     Standing balance support: Reliant on assistive device for balance;During functional activity Standing balance-Tara [name]: Fair                             ADL either performed or assessed with clinical judgement   ADL Overall ADL's : Needs assistance/impaired Eating/Feeding: Set up;Sitting   Grooming: Wash/dry face;Sitting   Upper Body Bathing: Set up;Sitting   Lower Body Bathing: Minimal assistance;Sit to/from stand;Sitting/lateral leans   Upper Body Dressing : Set up;Sitting   Lower Body Dressing: Minimal assistance;Sit to/from stand;Sitting/lateral leans   Toilet Transfer: Minimal assistance;Ambulation;Regular Glass blower/designer Details (indicate cue type and reason): patient needed hand held assistance with patient noted to attempt to furntiure walk for tasks. patient was educated on using assistive device for mobility. patient verbalized understanding but continued to furniture walk back  to bed. patient was noted to have drainage from JP site during all movement. nursing aware. session was cut short with nurse coming into to remove JP  drain. Toileting- Clothing Manipulation and Hygiene: Minimal assistance;Sit to/from stand;Sitting/lateral lean       Functional mobility during ADLs: Minimal assistance;Cueing for safety;Cueing for sequencing       Vision Baseline Vision/History: 1 Wears glasses Ability to See in Adequate Light: 1 Impaired Patient Visual Report: No change from baseline       Perception     Praxis      Pertinent Vitals/Pain Pain Assessment: 0-10 Pain Score: 1  Pain Location: abdomen Pain Descriptors / Indicators: Other (Comment) Pain Intervention(s): Monitored during session     Hand Dominance     Extremity/Trunk Assessment Upper Extremity Assessment Upper Extremity Assessment: Overall WFL for tasks assessed   Lower Extremity Assessment Lower Extremity Assessment: Defer to PT evaluation   Cervical / Trunk Assessment Cervical / Trunk Assessment: Kyphotic   Communication Communication Communication: No difficulties   Cognition Arousal/Alertness: Awake/alert Behavior During Therapy: WFL for tasks assessed/performed Overall Cognitive Status: Within Functional Limits for tasks assessed                                 General Comments: patient has some moments of poor safety awareness during session. poor insight to deficits as well.     General Comments       Exercises     Shoulder Instructions      Home Living Family/patient expects to be discharged to:: Private residence Living Arrangements: Children;Other relatives Available Help at Discharge: Family Type of Home: House Home Access: Stairs to enter [employer] of Steps: 7 Entrance Stairs-Rails: Right Home Layout: One level     Bathroom Shower/Tub: Teacher, early years/pre: Tara [name]: Grab bars - tub/shower;None;Rollator (4 wheels);Shower seat - built in          Prior Functioning/Environment Prior Level of Function : Independent/Modified Independent              Mobility Comments: AMb with cane in public, at home cruises and uses wall for balance if needed ADLs Comments: independent in ADLs. patient reported having daughter present for all showers. patient reported daughter works during the day.        OT Problem List: Decreased activity tolerance;Impaired balance (sitting and/or standing);Decreased safety awareness;Decreased knowledge of use of DME or AE      OT Treatment/Interventions: Self-care/ADL training;Therapeutic exercise;Neuromuscular education;Energy conservation;DME and/or AE instruction;Therapeutic activities;Balance training;Patient/family education    OT Goals(Current goals can be found in the care plan section) Acute Rehab OT Goals Patient Stated Goal: to get back home OT Goal Formulation: With patient Time For Goal Achievement: 03/01/21 Potential to Achieve Goals: Fair  OT Frequency: Min 2X/week   Barriers to D/C:    patient is home alone during the day       Co-evaluation              AM-PAC OT "6 Clicks" Daily Activity     Outcome Measure Help from another person eating meals?: None Help from another person taking care of personal grooming?: A Little Help from another person toileting, which includes using toliet, bedpan, or urinal?: A Little Help from another person bathing (including washing, rinsing, drying)?: A Little Help from another person to put on and taking off regular upper body clothing?:  A Little Help from another person to put on and taking off regular lower body clothing?: A Little 6 Click Score: 19   End of Session Equipment Utilized During Treatment: Gait belt  Activity Tolerance: Patient tolerated treatment well Patient left: in bed;with call bell/phone within reach;with nursing/sitter in room  OT Visit Diagnosis: Unsteadiness on feet (R26.81);Muscle weakness (generalized) (M62.81);Other symptoms and signs involving cognitive function                Time: 1215-1226 OT Time  Calculation (min): 11 min Charges:  OT General Charges $OT Visit: 1 Visit OT Evaluation $OT Eval Low Complexity: 1 Low  Leota Sauers, MS Acute Rehabilitation Department Office# [phone] Pager# [phone]   Marcellina Millin 02/15/2021, 12:34 PM

## 2021-02-15 NOTE — TOC Transition Note (Signed)
Transition of Care [facility]) - CM/SW Discharge Note  Patient Details  Name: Tara [name] MRN: [id number] Date of Birth: [birth year]  Transition of Care [facility] CM/SW Contact:  Sherie Don, LCSW Phone Number: 02/15/2021, 12:41 PM  Clinical Narrative: [place] orders placed for PT and OT. CSW made [facility] referral to [facility] with [place]. CSW updated patient and RN. TOC signing off.  Final next level of care: [place] Barriers to Discharge: Barriers Resolved  Patient Goals and CMS Choice Patient states their goals for this hospitalization and ongoing recovery are:: Discharge home with [place] CMS Medicare.gov Compare Post Acute Care list provided to:: Patient Choice offered to / list presented to : Patient  Discharge Plan and Services        DME Arranged: N/A DME Agency: NA HH Arranged: PT, OT HH Agency: [place] Date [place]: 02/15/21 Time [place]: [id number] Representative spoke with at [place]: [place]  Readmission Risk Interventions Readmission Risk Prevention Plan 07/06/2020  Transportation Screening Complete  PCP or Specialist Appt within 5-7 Days Complete  Home Care Screening Complete  Medication Review (RN CM) Complete  Some recent data might be hidden

## 2021-02-17 DIAGNOSIS — G4730 Sleep apnea, unspecified: Secondary | ICD-10-CM | POA: Diagnosis not present

## 2021-02-17 DIAGNOSIS — J431 Panlobular emphysema: Secondary | ICD-10-CM | POA: Diagnosis not present

## 2021-02-17 DIAGNOSIS — Z48815 Encounter for surgical aftercare following surgery on the digestive system: Secondary | ICD-10-CM | POA: Diagnosis not present

## 2021-02-17 DIAGNOSIS — N183 Chronic kidney disease, stage 3 unspecified: Secondary | ICD-10-CM | POA: Diagnosis not present

## 2021-02-17 DIAGNOSIS — N390 Urinary tract infection, site not specified: Secondary | ICD-10-CM | POA: Diagnosis not present

## 2021-02-17 DIAGNOSIS — J449 Chronic obstructive pulmonary disease, unspecified: Secondary | ICD-10-CM | POA: Diagnosis not present

## 2021-02-17 DIAGNOSIS — J9611 Chronic respiratory failure with hypoxia: Secondary | ICD-10-CM | POA: Diagnosis not present

## 2021-02-17 DIAGNOSIS — I130 Hypertensive heart and chronic kidney disease with heart failure and stage 1 through stage 4 chronic kidney disease, or unspecified chronic kidney disease: Secondary | ICD-10-CM | POA: Diagnosis not present

## 2021-02-17 DIAGNOSIS — N321 Vesicointestinal fistula: Secondary | ICD-10-CM | POA: Diagnosis not present

## 2021-02-17 DIAGNOSIS — I509 Heart failure, unspecified: Secondary | ICD-10-CM | POA: Diagnosis not present

## 2021-02-17 DIAGNOSIS — D62 Acute posthemorrhagic anemia: Secondary | ICD-10-CM | POA: Diagnosis not present

## 2021-02-24 DIAGNOSIS — G4730 Sleep apnea, unspecified: Secondary | ICD-10-CM | POA: Diagnosis not present

## 2021-02-24 DIAGNOSIS — I130 Hypertensive heart and chronic kidney disease with heart failure and stage 1 through stage 4 chronic kidney disease, or unspecified chronic kidney disease: Secondary | ICD-10-CM | POA: Diagnosis not present

## 2021-02-24 DIAGNOSIS — D62 Acute posthemorrhagic anemia: Secondary | ICD-10-CM | POA: Diagnosis not present

## 2021-02-24 DIAGNOSIS — I509 Heart failure, unspecified: Secondary | ICD-10-CM | POA: Diagnosis not present

## 2021-02-24 DIAGNOSIS — N390 Urinary tract infection, site not specified: Secondary | ICD-10-CM | POA: Diagnosis not present

## 2021-02-24 DIAGNOSIS — J431 Panlobular emphysema: Secondary | ICD-10-CM | POA: Diagnosis not present

## 2021-02-24 DIAGNOSIS — N321 Vesicointestinal fistula: Secondary | ICD-10-CM | POA: Diagnosis not present

## 2021-02-24 DIAGNOSIS — N183 Chronic kidney disease, stage 3 unspecified: Secondary | ICD-10-CM | POA: Diagnosis not present

## 2021-02-24 DIAGNOSIS — Z48815 Encounter for surgical aftercare following surgery on the digestive system: Secondary | ICD-10-CM | POA: Diagnosis not present

## 2021-02-24 DIAGNOSIS — J9611 Chronic respiratory failure with hypoxia: Secondary | ICD-10-CM | POA: Diagnosis not present

## 2021-02-25 DIAGNOSIS — J449 Chronic obstructive pulmonary disease, unspecified: Secondary | ICD-10-CM | POA: Diagnosis not present

## 2021-02-25 DIAGNOSIS — N321 Vesicointestinal fistula: Secondary | ICD-10-CM | POA: Diagnosis not present

## 2021-02-25 DIAGNOSIS — K56609 Unspecified intestinal obstruction, unspecified as to partial versus complete obstruction: Secondary | ICD-10-CM | POA: Diagnosis not present

## 2021-02-25 DIAGNOSIS — Z6822 Body mass index (BMI) 22.0-22.9, adult: Secondary | ICD-10-CM | POA: Diagnosis not present

## 2021-03-02 DIAGNOSIS — Z48815 Encounter for surgical aftercare following surgery on the digestive system: Secondary | ICD-10-CM | POA: Diagnosis not present

## 2021-03-02 DIAGNOSIS — I130 Hypertensive heart and chronic kidney disease with heart failure and stage 1 through stage 4 chronic kidney disease, or unspecified chronic kidney disease: Secondary | ICD-10-CM | POA: Diagnosis not present

## 2021-03-02 DIAGNOSIS — N321 Vesicointestinal fistula: Secondary | ICD-10-CM | POA: Diagnosis not present

## 2021-03-02 DIAGNOSIS — N390 Urinary tract infection, site not specified: Secondary | ICD-10-CM | POA: Diagnosis not present

## 2021-03-02 DIAGNOSIS — D62 Acute posthemorrhagic anemia: Secondary | ICD-10-CM | POA: Diagnosis not present

## 2021-03-02 DIAGNOSIS — I509 Heart failure, unspecified: Secondary | ICD-10-CM | POA: Diagnosis not present

## 2021-03-02 DIAGNOSIS — J431 Panlobular emphysema: Secondary | ICD-10-CM | POA: Diagnosis not present

## 2021-03-02 DIAGNOSIS — G4730 Sleep apnea, unspecified: Secondary | ICD-10-CM | POA: Diagnosis not present

## 2021-03-02 DIAGNOSIS — J9611 Chronic respiratory failure with hypoxia: Secondary | ICD-10-CM | POA: Diagnosis not present

## 2021-03-02 DIAGNOSIS — N183 Chronic kidney disease, stage 3 unspecified: Secondary | ICD-10-CM | POA: Diagnosis not present

## 2021-03-03 DIAGNOSIS — N183 Chronic kidney disease, stage 3 unspecified: Secondary | ICD-10-CM | POA: Diagnosis not present

## 2021-03-03 DIAGNOSIS — N321 Vesicointestinal fistula: Secondary | ICD-10-CM | POA: Diagnosis not present

## 2021-03-03 DIAGNOSIS — Z48815 Encounter for surgical aftercare following surgery on the digestive system: Secondary | ICD-10-CM | POA: Diagnosis not present

## 2021-03-03 DIAGNOSIS — N390 Urinary tract infection, site not specified: Secondary | ICD-10-CM | POA: Diagnosis not present

## 2021-03-03 DIAGNOSIS — I509 Heart failure, unspecified: Secondary | ICD-10-CM | POA: Diagnosis not present

## 2021-03-03 DIAGNOSIS — G4730 Sleep apnea, unspecified: Secondary | ICD-10-CM | POA: Diagnosis not present

## 2021-03-03 DIAGNOSIS — D62 Acute posthemorrhagic anemia: Secondary | ICD-10-CM | POA: Diagnosis not present

## 2021-03-03 DIAGNOSIS — J431 Panlobular emphysema: Secondary | ICD-10-CM | POA: Diagnosis not present

## 2021-03-03 DIAGNOSIS — I130 Hypertensive heart and chronic kidney disease with heart failure and stage 1 through stage 4 chronic kidney disease, or unspecified chronic kidney disease: Secondary | ICD-10-CM | POA: Diagnosis not present

## 2021-03-03 DIAGNOSIS — J9611 Chronic respiratory failure with hypoxia: Secondary | ICD-10-CM | POA: Diagnosis not present

## 2021-03-05 DIAGNOSIS — I10 Essential (primary) hypertension: Secondary | ICD-10-CM | POA: Diagnosis not present

## 2021-03-05 DIAGNOSIS — E1121 Type 2 diabetes mellitus with diabetic nephropathy: Secondary | ICD-10-CM | POA: Diagnosis not present

## 2021-03-05 DIAGNOSIS — N1832 Chronic kidney disease, stage 3b: Secondary | ICD-10-CM | POA: Diagnosis not present

## 2021-03-10 DIAGNOSIS — I509 Heart failure, unspecified: Secondary | ICD-10-CM | POA: Diagnosis not present

## 2021-03-10 DIAGNOSIS — D62 Acute posthemorrhagic anemia: Secondary | ICD-10-CM | POA: Diagnosis not present

## 2021-03-10 DIAGNOSIS — N321 Vesicointestinal fistula: Secondary | ICD-10-CM | POA: Diagnosis not present

## 2021-03-10 DIAGNOSIS — J9611 Chronic respiratory failure with hypoxia: Secondary | ICD-10-CM | POA: Diagnosis not present

## 2021-03-10 DIAGNOSIS — N390 Urinary tract infection, site not specified: Secondary | ICD-10-CM | POA: Diagnosis not present

## 2021-03-10 DIAGNOSIS — J431 Panlobular emphysema: Secondary | ICD-10-CM | POA: Diagnosis not present

## 2021-03-10 DIAGNOSIS — G4730 Sleep apnea, unspecified: Secondary | ICD-10-CM | POA: Diagnosis not present

## 2021-03-10 DIAGNOSIS — N183 Chronic kidney disease, stage 3 unspecified: Secondary | ICD-10-CM | POA: Diagnosis not present

## 2021-03-10 DIAGNOSIS — Z48815 Encounter for surgical aftercare following surgery on the digestive system: Secondary | ICD-10-CM | POA: Diagnosis not present

## 2021-03-10 DIAGNOSIS — I130 Hypertensive heart and chronic kidney disease with heart failure and stage 1 through stage 4 chronic kidney disease, or unspecified chronic kidney disease: Secondary | ICD-10-CM | POA: Diagnosis not present

## 2021-03-14 DIAGNOSIS — J449 Chronic obstructive pulmonary disease, unspecified: Secondary | ICD-10-CM | POA: Diagnosis not present

## 2021-03-15 DIAGNOSIS — I480 Paroxysmal atrial fibrillation: Secondary | ICD-10-CM | POA: Diagnosis not present

## 2021-03-15 DIAGNOSIS — E785 Hyperlipidemia, unspecified: Secondary | ICD-10-CM | POA: Diagnosis not present

## 2021-03-15 DIAGNOSIS — I10 Essential (primary) hypertension: Secondary | ICD-10-CM | POA: Diagnosis not present

## 2021-03-15 DIAGNOSIS — I25118 Atherosclerotic heart disease of native coronary artery with other forms of angina pectoris: Secondary | ICD-10-CM | POA: Diagnosis not present

## 2021-04-06 DIAGNOSIS — I480 Paroxysmal atrial fibrillation: Secondary | ICD-10-CM | POA: Diagnosis not present

## 2021-04-06 DIAGNOSIS — N1832 Chronic kidney disease, stage 3b: Secondary | ICD-10-CM | POA: Diagnosis not present

## 2021-04-06 DIAGNOSIS — J449 Chronic obstructive pulmonary disease, unspecified: Secondary | ICD-10-CM | POA: Diagnosis not present

## 2021-04-06 DIAGNOSIS — I10 Essential (primary) hypertension: Secondary | ICD-10-CM | POA: Diagnosis not present

## 2021-04-14 DIAGNOSIS — J449 Chronic obstructive pulmonary disease, unspecified: Secondary | ICD-10-CM | POA: Diagnosis not present

## 2021-05-12 DIAGNOSIS — J449 Chronic obstructive pulmonary disease, unspecified: Secondary | ICD-10-CM | POA: Diagnosis not present

## 2021-05-27 DIAGNOSIS — I480 Paroxysmal atrial fibrillation: Secondary | ICD-10-CM | POA: Diagnosis not present

## 2021-05-27 DIAGNOSIS — Z6823 Body mass index (BMI) 23.0-23.9, adult: Secondary | ICD-10-CM | POA: Diagnosis not present

## 2021-05-27 DIAGNOSIS — I10 Essential (primary) hypertension: Secondary | ICD-10-CM | POA: Diagnosis not present

## 2021-05-27 DIAGNOSIS — J449 Chronic obstructive pulmonary disease, unspecified: Secondary | ICD-10-CM | POA: Diagnosis not present

## 2021-05-27 DIAGNOSIS — N1832 Chronic kidney disease, stage 3b: Secondary | ICD-10-CM | POA: Diagnosis not present

## 2021-06-04 DIAGNOSIS — I480 Paroxysmal atrial fibrillation: Secondary | ICD-10-CM | POA: Diagnosis not present

## 2021-06-04 DIAGNOSIS — J449 Chronic obstructive pulmonary disease, unspecified: Secondary | ICD-10-CM | POA: Diagnosis not present

## 2021-06-04 DIAGNOSIS — I10 Essential (primary) hypertension: Secondary | ICD-10-CM | POA: Diagnosis not present

## 2021-06-04 DIAGNOSIS — N1832 Chronic kidney disease, stage 3b: Secondary | ICD-10-CM | POA: Diagnosis not present

## 2021-06-12 DIAGNOSIS — J449 Chronic obstructive pulmonary disease, unspecified: Secondary | ICD-10-CM | POA: Diagnosis not present

## 2021-07-12 DIAGNOSIS — J449 Chronic obstructive pulmonary disease, unspecified: Secondary | ICD-10-CM | POA: Diagnosis not present

## 2021-07-22 IMAGING — MR MR HEAD W/O CM
9 of 10 series · 39 of 48 positions shown · non-contrast
Comparison: None.

CLINICAL DATA: Possible stroke on CT

EXAM:
MRI HEAD WITHOUT CONTRAST
TECHNIQUE: Multiplanar, multiecho pulse sequences of the brain and surrounding
structures were obtained without intravenous contrast.

[Series 5: DWI · axial · 4.0mm · 0.88mm/px · z∈[-57,+90]mm · 5 of 38 slices shown (1 of 4)]
[im 1/38]
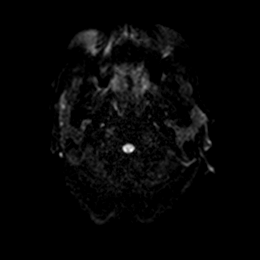
[im 10/38]
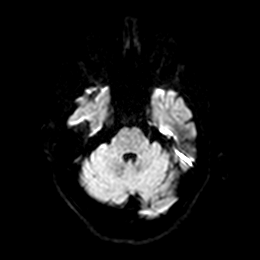
[im 19/38]
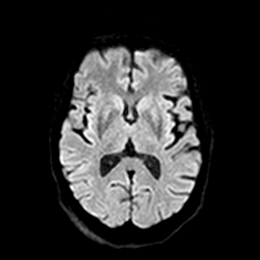
[im 28/38]
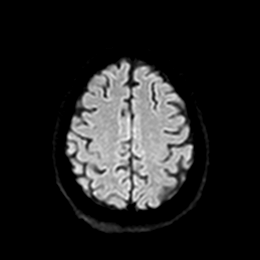
[im 38/38]
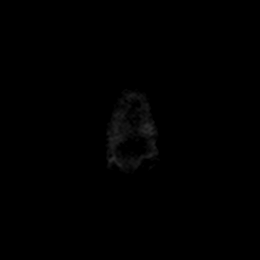

[Series 6: DWI · axial · 4.0mm · 0.88mm/px · z∈[-57,+90]mm · 5 of 37 slices shown (2 of 4)]
[im 1/37]
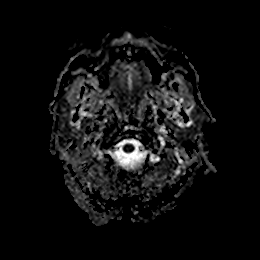
[im 10/37]
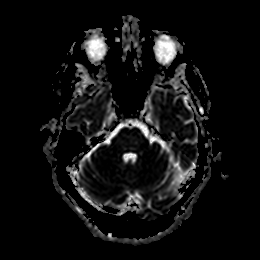
[im 19/37]
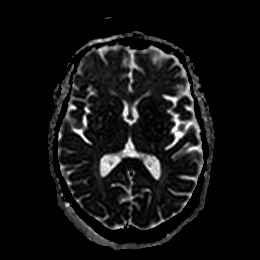
[im 28/37]
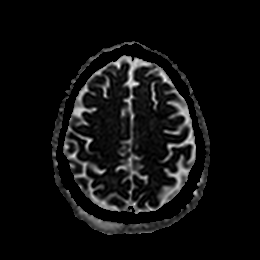
[im 37/37]
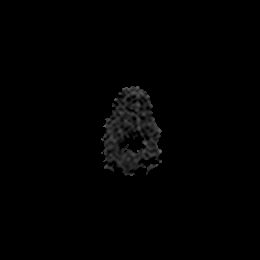

[Series 7: DWI · coronal · 4.0mm · 0.88mm/px · 5 of 32 slices shown (3 of 4)]
[im 1/32]
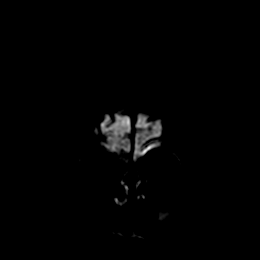
[im 8/32]
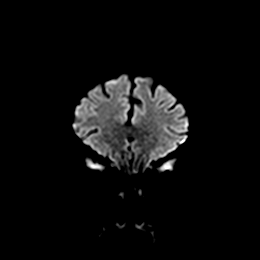
[im 16/32]
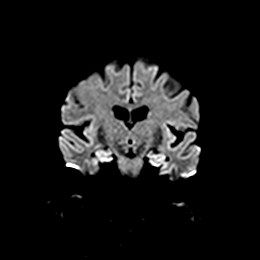
[im 24/32]
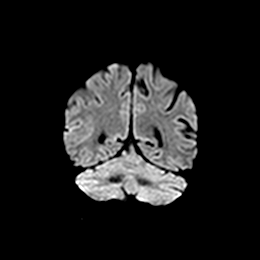
[im 32/32]
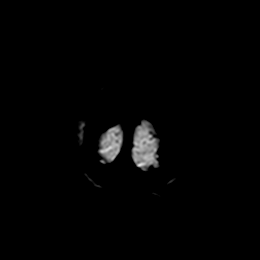

[Series 8: DWI · coronal · 4.0mm · 0.88mm/px · 5 of 32 slices shown (4 of 4)]
[im 1/32]
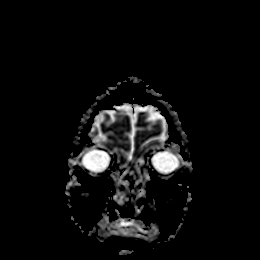
[im 8/32]
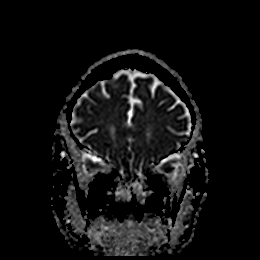
[im 16/32]
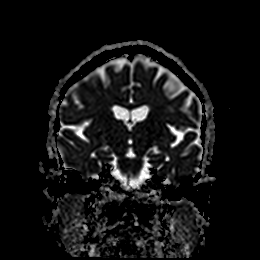
[im 24/32]
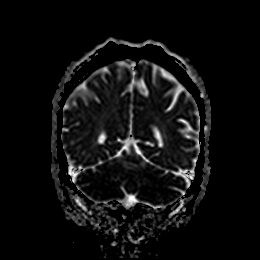
[im 32/32]
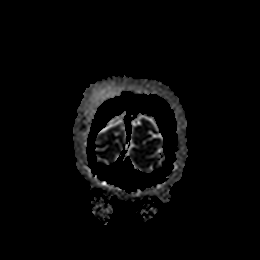

[Series 9: T1 · sagittal · 5.0mm · 0.80mm/px · 3 of 21 slices shown]
[im 1/21]
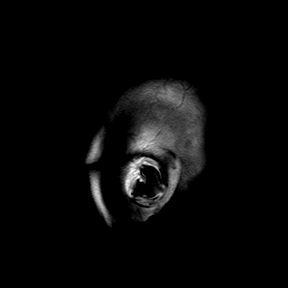
[im 11/21]
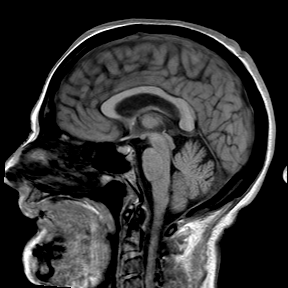
[im 21/21]
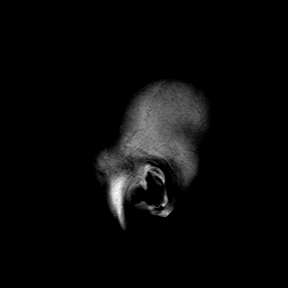

[Series 10: T2 · axial · 5.0mm · 0.72mm/px · z∈[-60,+93]mm · 3 of 23 slices shown (1 of 2)]
[im 1/23]
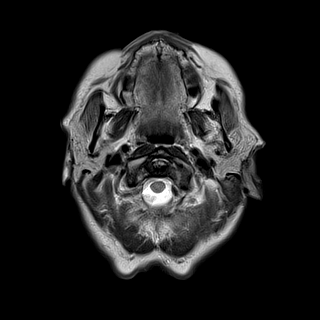
[im 12/23]
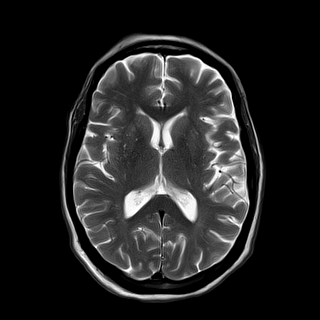
[im 23/23]
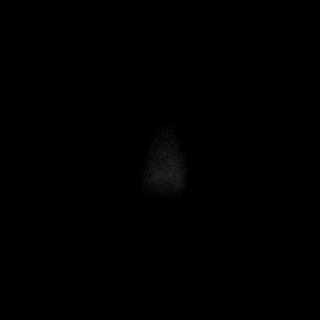

[Series 11: ax hemo · axial · 5.0mm · 0.86mm/px · z∈[-54,+89]mm · 4 of 25 slices shown]
[im 1/25]
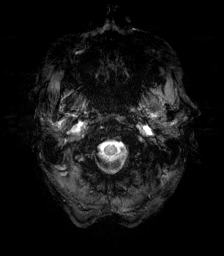
[im 9/25]
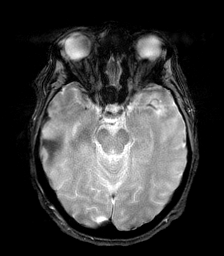
[im 17/25]
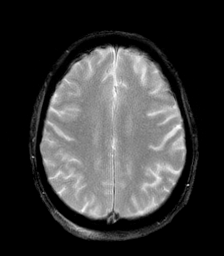
[im 25/25]
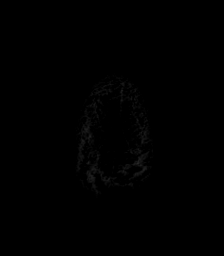

[Series 12: FLAIR · axial · 4.0mm · 0.43mm/px · z∈[-56,+91]mm · 5 of 38 slices shown]
[im 1/38]
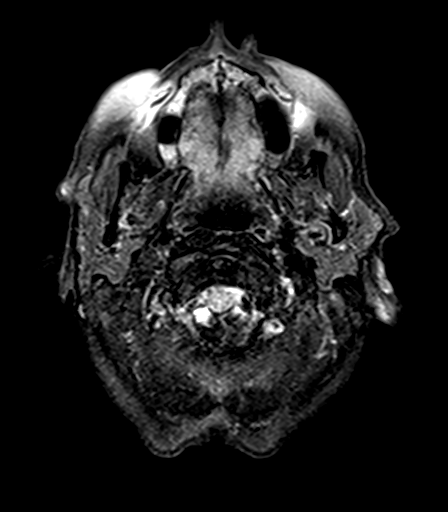
[im 10/38]
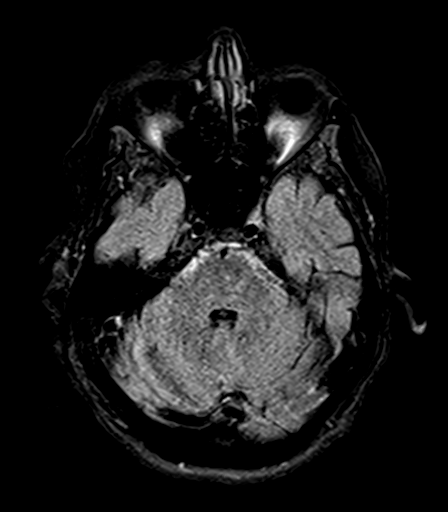
[im 19/38]
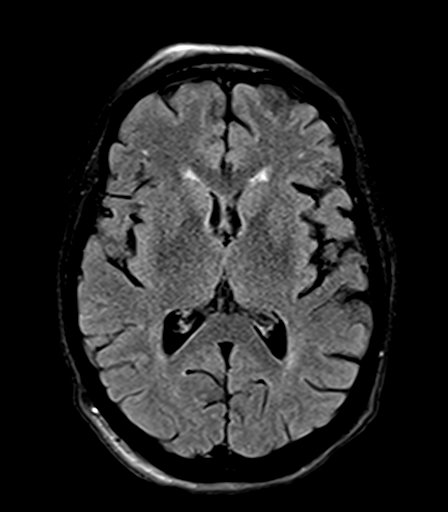
[im 28/38]
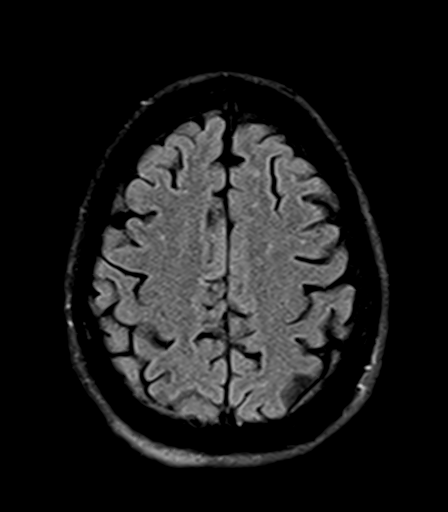
[im 38/38]
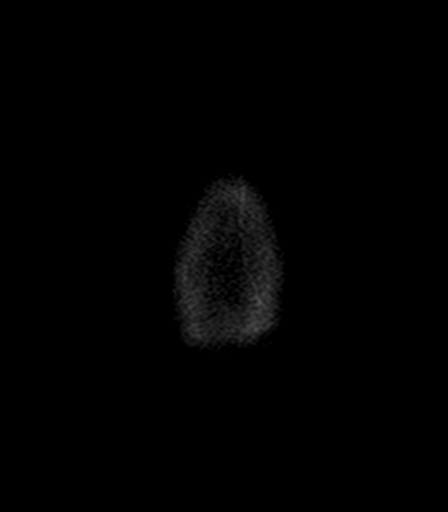

[Series 14: T2 · coronal · 5.0mm · 0.72mm/px · 4 of 28 slices shown (2 of 2)]
[im 1/28]
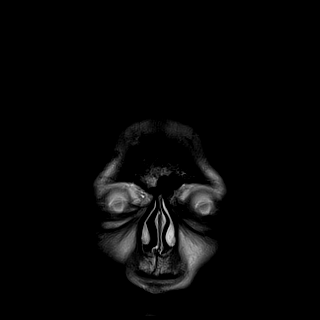
[im 10/28]
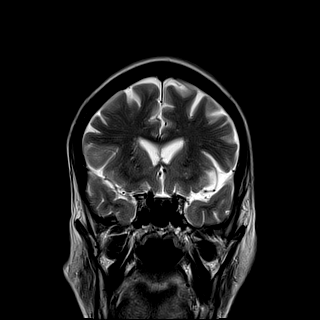
[im 19/28]
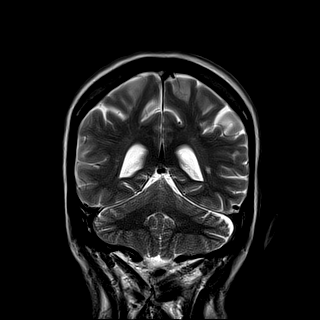
[im 28/28]
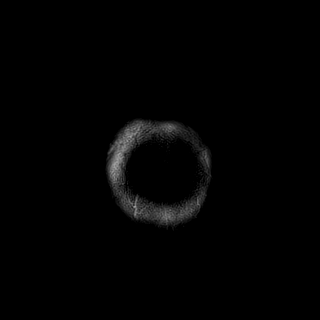

[39 of 48 positions shown; findings below may reference images not displayed]

FINDINGS: Brain: There is no acute infarction or intracranial hemorrhage.
There is no intracranial mass, mass effect, or edema. There is no
hydrocephalus or extra-axial fluid collection. Ventricles and sulci
are within normal limits in size and configuration. Patchy foci of
T2 hyperintensity in the supratentorial white matter are nonspecific
but probably reflect mild chronic microvascular ischemic changes.

Vascular: Major vessel flow voids at the skull base are preserved.

Skull and upper cervical spine: Normal marrow signal is preserved.

Sinuses/Orbits: Paranasal sinuses are aerated. Orbits are
unremarkable.

Other: Sella is unremarkable.  Mastoid air cells are clear.
IMPRESSION: No acute infarction, hemorrhage, or mass. Finding on CT is artifact.

Mild chronic microvascular ischemic changes.

## 2021-08-04 DIAGNOSIS — I480 Paroxysmal atrial fibrillation: Secondary | ICD-10-CM | POA: Diagnosis not present

## 2021-08-04 DIAGNOSIS — I10 Essential (primary) hypertension: Secondary | ICD-10-CM | POA: Diagnosis not present

## 2021-08-04 DIAGNOSIS — J449 Chronic obstructive pulmonary disease, unspecified: Secondary | ICD-10-CM | POA: Diagnosis not present

## 2021-08-04 DIAGNOSIS — N1832 Chronic kidney disease, stage 3b: Secondary | ICD-10-CM | POA: Diagnosis not present

## 2021-08-12 DIAGNOSIS — J449 Chronic obstructive pulmonary disease, unspecified: Secondary | ICD-10-CM | POA: Diagnosis not present

## 2021-09-02 DIAGNOSIS — J449 Chronic obstructive pulmonary disease, unspecified: Secondary | ICD-10-CM | POA: Diagnosis not present

## 2021-09-02 DIAGNOSIS — E782 Mixed hyperlipidemia: Secondary | ICD-10-CM | POA: Diagnosis not present

## 2021-09-02 DIAGNOSIS — N1832 Chronic kidney disease, stage 3b: Secondary | ICD-10-CM | POA: Diagnosis not present

## 2021-09-02 DIAGNOSIS — I10 Essential (primary) hypertension: Secondary | ICD-10-CM | POA: Diagnosis not present

## 2021-09-02 DIAGNOSIS — Z0000 Encounter for general adult medical examination without abnormal findings: Secondary | ICD-10-CM | POA: Diagnosis not present

## 2021-09-02 DIAGNOSIS — I700 Atherosclerosis of aorta: Secondary | ICD-10-CM | POA: Diagnosis not present

## 2021-09-02 DIAGNOSIS — L570 Actinic keratosis: Secondary | ICD-10-CM | POA: Diagnosis not present

## 2021-09-02 DIAGNOSIS — Z6827 Body mass index (BMI) 27.0-27.9, adult: Secondary | ICD-10-CM | POA: Diagnosis not present

## 2021-09-11 DIAGNOSIS — J449 Chronic obstructive pulmonary disease, unspecified: Secondary | ICD-10-CM | POA: Diagnosis not present

## 2021-10-12 DIAGNOSIS — J449 Chronic obstructive pulmonary disease, unspecified: Secondary | ICD-10-CM | POA: Diagnosis not present

## 2021-11-04 DIAGNOSIS — J449 Chronic obstructive pulmonary disease, unspecified: Secondary | ICD-10-CM | POA: Diagnosis not present

## 2021-11-04 DIAGNOSIS — N1832 Chronic kidney disease, stage 3b: Secondary | ICD-10-CM | POA: Diagnosis not present

## 2021-11-04 DIAGNOSIS — I10 Essential (primary) hypertension: Secondary | ICD-10-CM | POA: Diagnosis not present

## 2021-11-04 DIAGNOSIS — I700 Atherosclerosis of aorta: Secondary | ICD-10-CM | POA: Diagnosis not present

## 2021-11-04 DIAGNOSIS — E782 Mixed hyperlipidemia: Secondary | ICD-10-CM | POA: Diagnosis not present

## 2021-11-12 DIAGNOSIS — J449 Chronic obstructive pulmonary disease, unspecified: Secondary | ICD-10-CM | POA: Diagnosis not present

## 2021-12-12 DIAGNOSIS — J449 Chronic obstructive pulmonary disease, unspecified: Secondary | ICD-10-CM | POA: Diagnosis not present

## 2021-12-14 DIAGNOSIS — E209 Hypoparathyroidism, unspecified: Secondary | ICD-10-CM | POA: Diagnosis not present

## 2021-12-14 DIAGNOSIS — Z0000 Encounter for general adult medical examination without abnormal findings: Secondary | ICD-10-CM | POA: Diagnosis not present

## 2021-12-14 DIAGNOSIS — I10 Essential (primary) hypertension: Secondary | ICD-10-CM | POA: Diagnosis not present

## 2021-12-14 DIAGNOSIS — J449 Chronic obstructive pulmonary disease, unspecified: Secondary | ICD-10-CM | POA: Diagnosis not present

## 2021-12-14 DIAGNOSIS — N184 Chronic kidney disease, stage 4 (severe): Secondary | ICD-10-CM | POA: Diagnosis not present

## 2021-12-14 DIAGNOSIS — I700 Atherosclerosis of aorta: Secondary | ICD-10-CM | POA: Diagnosis not present

## 2021-12-14 DIAGNOSIS — E782 Mixed hyperlipidemia: Secondary | ICD-10-CM | POA: Diagnosis not present

## 2021-12-14 DIAGNOSIS — L570 Actinic keratosis: Secondary | ICD-10-CM | POA: Diagnosis not present

## 2021-12-14 DIAGNOSIS — Z6830 Body mass index (BMI) 30.0-30.9, adult: Secondary | ICD-10-CM | POA: Diagnosis not present

## 2022-01-12 DIAGNOSIS — J449 Chronic obstructive pulmonary disease, unspecified: Secondary | ICD-10-CM | POA: Diagnosis not present

## 2022-01-21 DIAGNOSIS — Z1231 Encounter for screening mammogram for malignant neoplasm of breast: Secondary | ICD-10-CM | POA: Diagnosis not present

## 2022-02-03 DIAGNOSIS — H52229 Regular astigmatism, unspecified eye: Secondary | ICD-10-CM | POA: Diagnosis not present

## 2022-02-03 DIAGNOSIS — I700 Atherosclerosis of aorta: Secondary | ICD-10-CM | POA: Diagnosis not present

## 2022-02-03 DIAGNOSIS — H5203 Hypermetropia, bilateral: Secondary | ICD-10-CM | POA: Diagnosis not present

## 2022-02-03 DIAGNOSIS — J449 Chronic obstructive pulmonary disease, unspecified: Secondary | ICD-10-CM | POA: Diagnosis not present

## 2022-02-03 DIAGNOSIS — H524 Presbyopia: Secondary | ICD-10-CM | POA: Diagnosis not present

## 2022-02-03 DIAGNOSIS — E782 Mixed hyperlipidemia: Secondary | ICD-10-CM | POA: Diagnosis not present

## 2022-02-03 DIAGNOSIS — N1832 Chronic kidney disease, stage 3b: Secondary | ICD-10-CM | POA: Diagnosis not present

## 2022-02-03 DIAGNOSIS — I10 Essential (primary) hypertension: Secondary | ICD-10-CM | POA: Diagnosis not present

## 2022-02-03 DIAGNOSIS — H2510 Age-related nuclear cataract, unspecified eye: Secondary | ICD-10-CM | POA: Diagnosis not present

## 2022-02-03 DIAGNOSIS — H5213 Myopia, bilateral: Secondary | ICD-10-CM | POA: Diagnosis not present

## 2022-02-08 DIAGNOSIS — D6869 Other thrombophilia: Secondary | ICD-10-CM | POA: Diagnosis not present

## 2022-02-08 DIAGNOSIS — I4891 Unspecified atrial fibrillation: Secondary | ICD-10-CM | POA: Diagnosis not present

## 2022-02-08 DIAGNOSIS — I509 Heart failure, unspecified: Secondary | ICD-10-CM | POA: Diagnosis not present

## 2022-02-08 DIAGNOSIS — I739 Peripheral vascular disease, unspecified: Secondary | ICD-10-CM | POA: Diagnosis not present

## 2022-02-08 DIAGNOSIS — K219 Gastro-esophageal reflux disease without esophagitis: Secondary | ICD-10-CM | POA: Diagnosis not present

## 2022-02-08 DIAGNOSIS — R32 Unspecified urinary incontinence: Secondary | ICD-10-CM | POA: Diagnosis not present

## 2022-02-08 DIAGNOSIS — E669 Obesity, unspecified: Secondary | ICD-10-CM | POA: Diagnosis not present

## 2022-02-08 DIAGNOSIS — J439 Emphysema, unspecified: Secondary | ICD-10-CM | POA: Diagnosis not present

## 2022-02-08 DIAGNOSIS — I272 Pulmonary hypertension, unspecified: Secondary | ICD-10-CM | POA: Diagnosis not present

## 2022-02-08 DIAGNOSIS — E039 Hypothyroidism, unspecified: Secondary | ICD-10-CM | POA: Diagnosis not present

## 2022-02-08 DIAGNOSIS — I110 Hypertensive heart disease with heart failure: Secondary | ICD-10-CM | POA: Diagnosis not present

## 2022-02-08 DIAGNOSIS — J9610 Chronic respiratory failure, unspecified whether with hypoxia or hypercapnia: Secondary | ICD-10-CM | POA: Diagnosis not present

## 2022-02-11 DIAGNOSIS — J449 Chronic obstructive pulmonary disease, unspecified: Secondary | ICD-10-CM | POA: Diagnosis not present

## 2022-03-14 DIAGNOSIS — I10 Essential (primary) hypertension: Secondary | ICD-10-CM | POA: Diagnosis not present

## 2022-03-14 DIAGNOSIS — J449 Chronic obstructive pulmonary disease, unspecified: Secondary | ICD-10-CM | POA: Diagnosis not present

## 2022-03-14 DIAGNOSIS — I5032 Chronic diastolic (congestive) heart failure: Secondary | ICD-10-CM | POA: Diagnosis not present

## 2022-03-14 DIAGNOSIS — I480 Paroxysmal atrial fibrillation: Secondary | ICD-10-CM | POA: Diagnosis not present

## 2022-03-14 DIAGNOSIS — I25118 Atherosclerotic heart disease of native coronary artery with other forms of angina pectoris: Secondary | ICD-10-CM | POA: Diagnosis not present

## 2022-03-17 DIAGNOSIS — J449 Chronic obstructive pulmonary disease, unspecified: Secondary | ICD-10-CM | POA: Diagnosis not present

## 2022-03-17 DIAGNOSIS — E782 Mixed hyperlipidemia: Secondary | ICD-10-CM | POA: Diagnosis not present

## 2022-03-17 DIAGNOSIS — Z131 Encounter for screening for diabetes mellitus: Secondary | ICD-10-CM | POA: Diagnosis not present

## 2022-03-17 DIAGNOSIS — N1832 Chronic kidney disease, stage 3b: Secondary | ICD-10-CM | POA: Diagnosis not present

## 2022-03-17 DIAGNOSIS — I10 Essential (primary) hypertension: Secondary | ICD-10-CM | POA: Diagnosis not present

## 2022-03-17 DIAGNOSIS — Z6831 Body mass index (BMI) 31.0-31.9, adult: Secondary | ICD-10-CM | POA: Diagnosis not present

## 2022-03-17 DIAGNOSIS — I700 Atherosclerosis of aorta: Secondary | ICD-10-CM | POA: Diagnosis not present

## 2022-03-17 DIAGNOSIS — E669 Obesity, unspecified: Secondary | ICD-10-CM | POA: Diagnosis not present

## 2022-03-18 DIAGNOSIS — Z8639 Personal history of other endocrine, nutritional and metabolic disease: Secondary | ICD-10-CM | POA: Diagnosis not present

## 2022-04-14 DIAGNOSIS — J449 Chronic obstructive pulmonary disease, unspecified: Secondary | ICD-10-CM | POA: Diagnosis not present

## 2022-05-13 DIAGNOSIS — J449 Chronic obstructive pulmonary disease, unspecified: Secondary | ICD-10-CM | POA: Diagnosis not present

## 2022-06-08 DIAGNOSIS — R32 Unspecified urinary incontinence: Secondary | ICD-10-CM | POA: Diagnosis not present

## 2022-06-08 DIAGNOSIS — E785 Hyperlipidemia, unspecified: Secondary | ICD-10-CM | POA: Diagnosis not present

## 2022-06-08 DIAGNOSIS — E039 Hypothyroidism, unspecified: Secondary | ICD-10-CM | POA: Diagnosis not present

## 2022-06-08 DIAGNOSIS — R0902 Hypoxemia: Secondary | ICD-10-CM | POA: Diagnosis not present

## 2022-06-08 DIAGNOSIS — Z008 Encounter for other general examination: Secondary | ICD-10-CM | POA: Diagnosis not present

## 2022-06-08 DIAGNOSIS — E669 Obesity, unspecified: Secondary | ICD-10-CM | POA: Diagnosis not present

## 2022-06-08 DIAGNOSIS — M1990 Unspecified osteoarthritis, unspecified site: Secondary | ICD-10-CM | POA: Diagnosis not present

## 2022-06-08 DIAGNOSIS — G4730 Sleep apnea, unspecified: Secondary | ICD-10-CM | POA: Diagnosis not present

## 2022-06-08 DIAGNOSIS — I4891 Unspecified atrial fibrillation: Secondary | ICD-10-CM | POA: Diagnosis not present

## 2022-06-08 DIAGNOSIS — D6869 Other thrombophilia: Secondary | ICD-10-CM | POA: Diagnosis not present

## 2022-06-08 DIAGNOSIS — R69 Illness, unspecified: Secondary | ICD-10-CM | POA: Diagnosis not present

## 2022-06-08 DIAGNOSIS — K219 Gastro-esophageal reflux disease without esophagitis: Secondary | ICD-10-CM | POA: Diagnosis not present

## 2022-06-08 DIAGNOSIS — F419 Anxiety disorder, unspecified: Secondary | ICD-10-CM | POA: Diagnosis not present

## 2022-06-08 DIAGNOSIS — E1142 Type 2 diabetes mellitus with diabetic polyneuropathy: Secondary | ICD-10-CM | POA: Diagnosis not present

## 2022-06-13 DIAGNOSIS — J449 Chronic obstructive pulmonary disease, unspecified: Secondary | ICD-10-CM | POA: Diagnosis not present

## 2022-06-16 DIAGNOSIS — E669 Obesity, unspecified: Secondary | ICD-10-CM | POA: Diagnosis not present

## 2022-06-16 DIAGNOSIS — Z6831 Body mass index (BMI) 31.0-31.9, adult: Secondary | ICD-10-CM | POA: Diagnosis not present

## 2022-06-16 DIAGNOSIS — I10 Essential (primary) hypertension: Secondary | ICD-10-CM | POA: Diagnosis not present

## 2022-06-16 DIAGNOSIS — Z0000 Encounter for general adult medical examination without abnormal findings: Secondary | ICD-10-CM | POA: Diagnosis not present

## 2022-06-16 DIAGNOSIS — E782 Mixed hyperlipidemia: Secondary | ICD-10-CM | POA: Diagnosis not present

## 2022-06-16 DIAGNOSIS — N1832 Chronic kidney disease, stage 3b: Secondary | ICD-10-CM | POA: Diagnosis not present

## 2022-06-16 DIAGNOSIS — J449 Chronic obstructive pulmonary disease, unspecified: Secondary | ICD-10-CM | POA: Diagnosis not present

## 2022-06-16 DIAGNOSIS — I700 Atherosclerosis of aorta: Secondary | ICD-10-CM | POA: Diagnosis not present

## 2022-07-13 DIAGNOSIS — J449 Chronic obstructive pulmonary disease, unspecified: Secondary | ICD-10-CM | POA: Diagnosis not present

## 2022-08-13 DIAGNOSIS — J449 Chronic obstructive pulmonary disease, unspecified: Secondary | ICD-10-CM | POA: Diagnosis not present

## 2022-09-12 DIAGNOSIS — J449 Chronic obstructive pulmonary disease, unspecified: Secondary | ICD-10-CM | POA: Diagnosis not present

## 2022-09-14 DIAGNOSIS — F17210 Nicotine dependence, cigarettes, uncomplicated: Secondary | ICD-10-CM | POA: Diagnosis not present

## 2022-09-14 DIAGNOSIS — I10 Essential (primary) hypertension: Secondary | ICD-10-CM | POA: Diagnosis not present

## 2022-09-14 DIAGNOSIS — I484 Atypical atrial flutter: Secondary | ICD-10-CM | POA: Diagnosis not present

## 2022-09-14 DIAGNOSIS — I25118 Atherosclerotic heart disease of native coronary artery with other forms of angina pectoris: Secondary | ICD-10-CM | POA: Diagnosis not present

## 2022-09-14 DIAGNOSIS — E785 Hyperlipidemia, unspecified: Secondary | ICD-10-CM | POA: Diagnosis not present

## 2022-09-14 DIAGNOSIS — I5032 Chronic diastolic (congestive) heart failure: Secondary | ICD-10-CM | POA: Diagnosis not present

## 2022-09-14 DIAGNOSIS — I480 Paroxysmal atrial fibrillation: Secondary | ICD-10-CM | POA: Diagnosis not present

## 2022-09-15 DIAGNOSIS — Z0000 Encounter for general adult medical examination without abnormal findings: Secondary | ICD-10-CM | POA: Diagnosis not present

## 2022-09-15 DIAGNOSIS — E782 Mixed hyperlipidemia: Secondary | ICD-10-CM | POA: Diagnosis not present

## 2022-09-15 DIAGNOSIS — I10 Essential (primary) hypertension: Secondary | ICD-10-CM | POA: Diagnosis not present

## 2022-09-15 DIAGNOSIS — E669 Obesity, unspecified: Secondary | ICD-10-CM | POA: Diagnosis not present

## 2022-09-15 DIAGNOSIS — J449 Chronic obstructive pulmonary disease, unspecified: Secondary | ICD-10-CM | POA: Diagnosis not present

## 2022-09-15 DIAGNOSIS — I700 Atherosclerosis of aorta: Secondary | ICD-10-CM | POA: Diagnosis not present

## 2022-09-15 DIAGNOSIS — Z6831 Body mass index (BMI) 31.0-31.9, adult: Secondary | ICD-10-CM | POA: Diagnosis not present

## 2022-09-15 DIAGNOSIS — N1832 Chronic kidney disease, stage 3b: Secondary | ICD-10-CM | POA: Diagnosis not present

## 2022-10-03 DIAGNOSIS — Z780 Asymptomatic menopausal state: Secondary | ICD-10-CM | POA: Diagnosis not present

## 2022-10-03 DIAGNOSIS — M85851 Other specified disorders of bone density and structure, right thigh: Secondary | ICD-10-CM | POA: Diagnosis not present

## 2022-10-03 DIAGNOSIS — M810 Age-related osteoporosis without current pathological fracture: Secondary | ICD-10-CM | POA: Diagnosis not present

## 2022-10-13 DIAGNOSIS — J449 Chronic obstructive pulmonary disease, unspecified: Secondary | ICD-10-CM | POA: Diagnosis not present

## 2022-11-13 DIAGNOSIS — J449 Chronic obstructive pulmonary disease, unspecified: Secondary | ICD-10-CM | POA: Diagnosis not present

## 2022-12-13 DIAGNOSIS — J449 Chronic obstructive pulmonary disease, unspecified: Secondary | ICD-10-CM | POA: Diagnosis not present

## 2022-12-15 DIAGNOSIS — N184 Chronic kidney disease, stage 4 (severe): Secondary | ICD-10-CM | POA: Diagnosis not present

## 2022-12-15 DIAGNOSIS — I10 Essential (primary) hypertension: Secondary | ICD-10-CM | POA: Diagnosis not present

## 2022-12-15 DIAGNOSIS — E669 Obesity, unspecified: Secondary | ICD-10-CM | POA: Diagnosis not present

## 2022-12-15 DIAGNOSIS — J449 Chronic obstructive pulmonary disease, unspecified: Secondary | ICD-10-CM | POA: Diagnosis not present

## 2022-12-15 DIAGNOSIS — I700 Atherosclerosis of aorta: Secondary | ICD-10-CM | POA: Diagnosis not present

## 2022-12-15 DIAGNOSIS — E782 Mixed hyperlipidemia: Secondary | ICD-10-CM | POA: Diagnosis not present

## 2022-12-15 DIAGNOSIS — Z6829 Body mass index (BMI) 29.0-29.9, adult: Secondary | ICD-10-CM | POA: Diagnosis not present

## 2023-01-13 DIAGNOSIS — J449 Chronic obstructive pulmonary disease, unspecified: Secondary | ICD-10-CM | POA: Diagnosis not present

## 2023-02-12 DIAGNOSIS — J449 Chronic obstructive pulmonary disease, unspecified: Secondary | ICD-10-CM | POA: Diagnosis not present

## 2023-03-15 DIAGNOSIS — J449 Chronic obstructive pulmonary disease, unspecified: Secondary | ICD-10-CM | POA: Diagnosis not present

## 2023-03-23 DIAGNOSIS — I480 Paroxysmal atrial fibrillation: Secondary | ICD-10-CM | POA: Diagnosis not present

## 2023-03-23 DIAGNOSIS — Z9912 Encounter for respirator [ventilator] dependence during power failure: Secondary | ICD-10-CM | POA: Diagnosis not present

## 2023-03-23 DIAGNOSIS — Z0000 Encounter for general adult medical examination without abnormal findings: Secondary | ICD-10-CM | POA: Diagnosis not present

## 2023-03-23 DIAGNOSIS — J449 Chronic obstructive pulmonary disease, unspecified: Secondary | ICD-10-CM | POA: Diagnosis not present

## 2023-03-23 DIAGNOSIS — E669 Obesity, unspecified: Secondary | ICD-10-CM | POA: Diagnosis not present

## 2023-03-23 DIAGNOSIS — J9612 Chronic respiratory failure with hypercapnia: Secondary | ICD-10-CM | POA: Diagnosis not present

## 2023-03-23 DIAGNOSIS — Z6833 Body mass index (BMI) 33.0-33.9, adult: Secondary | ICD-10-CM | POA: Diagnosis not present

## 2023-03-23 DIAGNOSIS — E782 Mixed hyperlipidemia: Secondary | ICD-10-CM | POA: Diagnosis not present

## 2023-03-23 DIAGNOSIS — I10 Essential (primary) hypertension: Secondary | ICD-10-CM | POA: Diagnosis not present

## 2023-03-23 DIAGNOSIS — N184 Chronic kidney disease, stage 4 (severe): Secondary | ICD-10-CM | POA: Diagnosis not present

## 2023-03-23 DIAGNOSIS — I700 Atherosclerosis of aorta: Secondary | ICD-10-CM | POA: Diagnosis not present

## 2023-04-15 DIAGNOSIS — J449 Chronic obstructive pulmonary disease, unspecified: Secondary | ICD-10-CM | POA: Diagnosis not present

## 2023-04-25 DIAGNOSIS — H16223 Keratoconjunctivitis sicca, not specified as Sjogren's, bilateral: Secondary | ICD-10-CM | POA: Diagnosis not present

## 2023-04-25 DIAGNOSIS — H2513 Age-related nuclear cataract, bilateral: Secondary | ICD-10-CM | POA: Diagnosis not present

## 2023-04-25 DIAGNOSIS — H40003 Preglaucoma, unspecified, bilateral: Secondary | ICD-10-CM | POA: Diagnosis not present

## 2023-04-25 DIAGNOSIS — H01001 Unspecified blepharitis right upper eyelid: Secondary | ICD-10-CM | POA: Diagnosis not present

## 2023-05-13 DIAGNOSIS — J449 Chronic obstructive pulmonary disease, unspecified: Secondary | ICD-10-CM | POA: Diagnosis not present

## 2023-06-13 DIAGNOSIS — J449 Chronic obstructive pulmonary disease, unspecified: Secondary | ICD-10-CM | POA: Diagnosis not present

## 2023-07-13 DIAGNOSIS — J449 Chronic obstructive pulmonary disease, unspecified: Secondary | ICD-10-CM | POA: Diagnosis not present

## 2023-08-13 DIAGNOSIS — J449 Chronic obstructive pulmonary disease, unspecified: Secondary | ICD-10-CM | POA: Diagnosis not present

## 2023-08-30 DIAGNOSIS — I480 Paroxysmal atrial fibrillation: Secondary | ICD-10-CM | POA: Diagnosis not present

## 2023-08-30 DIAGNOSIS — Z0000 Encounter for general adult medical examination without abnormal findings: Secondary | ICD-10-CM | POA: Diagnosis not present

## 2023-08-30 DIAGNOSIS — J9612 Chronic respiratory failure with hypercapnia: Secondary | ICD-10-CM | POA: Diagnosis not present

## 2023-08-30 DIAGNOSIS — I700 Atherosclerosis of aorta: Secondary | ICD-10-CM | POA: Diagnosis not present

## 2023-08-30 DIAGNOSIS — Z6832 Body mass index (BMI) 32.0-32.9, adult: Secondary | ICD-10-CM | POA: Diagnosis not present

## 2023-08-30 DIAGNOSIS — N184 Chronic kidney disease, stage 4 (severe): Secondary | ICD-10-CM | POA: Diagnosis not present

## 2023-08-30 DIAGNOSIS — I10 Essential (primary) hypertension: Secondary | ICD-10-CM | POA: Diagnosis not present

## 2023-08-30 DIAGNOSIS — J449 Chronic obstructive pulmonary disease, unspecified: Secondary | ICD-10-CM | POA: Diagnosis not present

## 2023-08-30 DIAGNOSIS — E669 Obesity, unspecified: Secondary | ICD-10-CM | POA: Diagnosis not present

## 2023-08-30 DIAGNOSIS — E782 Mixed hyperlipidemia: Secondary | ICD-10-CM | POA: Diagnosis not present

## 2023-08-30 DIAGNOSIS — Z9912 Encounter for respirator [ventilator] dependence during power failure: Secondary | ICD-10-CM | POA: Diagnosis not present

## 2023-09-12 DIAGNOSIS — J449 Chronic obstructive pulmonary disease, unspecified: Secondary | ICD-10-CM | POA: Diagnosis not present

## 2023-10-13 DIAGNOSIS — J449 Chronic obstructive pulmonary disease, unspecified: Secondary | ICD-10-CM | POA: Diagnosis not present

## 2023-11-13 DIAGNOSIS — J449 Chronic obstructive pulmonary disease, unspecified: Secondary | ICD-10-CM | POA: Diagnosis not present

## 2023-11-30 DIAGNOSIS — I480 Paroxysmal atrial fibrillation: Secondary | ICD-10-CM | POA: Diagnosis not present

## 2023-11-30 DIAGNOSIS — I7389 Other specified peripheral vascular diseases: Secondary | ICD-10-CM | POA: Diagnosis not present

## 2023-11-30 DIAGNOSIS — J449 Chronic obstructive pulmonary disease, unspecified: Secondary | ICD-10-CM | POA: Diagnosis not present

## 2023-11-30 DIAGNOSIS — N184 Chronic kidney disease, stage 4 (severe): Secondary | ICD-10-CM | POA: Diagnosis not present

## 2023-11-30 DIAGNOSIS — E782 Mixed hyperlipidemia: Secondary | ICD-10-CM | POA: Diagnosis not present

## 2023-11-30 DIAGNOSIS — I700 Atherosclerosis of aorta: Secondary | ICD-10-CM | POA: Diagnosis not present

## 2023-11-30 DIAGNOSIS — I5032 Chronic diastolic (congestive) heart failure: Secondary | ICD-10-CM | POA: Diagnosis not present

## 2023-11-30 DIAGNOSIS — Z6832 Body mass index (BMI) 32.0-32.9, adult: Secondary | ICD-10-CM | POA: Diagnosis not present

## 2023-11-30 DIAGNOSIS — I2723 Pulmonary hypertension due to lung diseases and hypoxia: Secondary | ICD-10-CM | POA: Diagnosis not present

## 2023-11-30 DIAGNOSIS — E1122 Type 2 diabetes mellitus with diabetic chronic kidney disease: Secondary | ICD-10-CM | POA: Diagnosis not present

## 2023-11-30 DIAGNOSIS — J9612 Chronic respiratory failure with hypercapnia: Secondary | ICD-10-CM | POA: Diagnosis not present

## 2023-11-30 DIAGNOSIS — I10 Essential (primary) hypertension: Secondary | ICD-10-CM | POA: Diagnosis not present

## 2023-12-13 DIAGNOSIS — J449 Chronic obstructive pulmonary disease, unspecified: Secondary | ICD-10-CM | POA: Diagnosis not present

## 2024-02-12 DIAGNOSIS — J449 Chronic obstructive pulmonary disease, unspecified: Secondary | ICD-10-CM | POA: Diagnosis not present

## 2024-03-22 ENCOUNTER — Other Ambulatory Visit (HOSPITAL_COMMUNITY): Payer: Self-pay | Admitting: Nephrology

## 2024-03-22 DIAGNOSIS — N184 Chronic kidney disease, stage 4 (severe): Secondary | ICD-10-CM

## 2024-03-29 ENCOUNTER — Ambulatory Visit (HOSPITAL_COMMUNITY)
Admission: RE | Admit: 2024-03-29 | Discharge: 2024-03-29 | Disposition: A | Source: Ambulatory Visit | Attending: Nephrology | Admitting: Nephrology

## 2024-03-29 DIAGNOSIS — N184 Chronic kidney disease, stage 4 (severe): Secondary | ICD-10-CM | POA: Insufficient documentation

## 2024-04-09 NOTE — Progress Notes (Signed)
 Tara [name]                                          MRN: [id number]   04/09/2024   The [facility] Quality Team Specialist reviewed this patient medical record for the purposes of chart review for care gap closure. The following were reviewed: chart review for care gap closure-kidney health evaluation for diabetes:eGFR  and uACR.    [facility] Quality Team
